# Patient Record
Sex: Female | Born: 1960 | Race: Black or African American | Hispanic: No | Smoking: Never smoker
Health system: Southern US, Community
[De-identification: ages and names within clinical notes are randomized; demographics above are authoritative.]

## PROBLEM LIST (undated history)

## (undated) DIAGNOSIS — R569 Unspecified convulsions: Secondary | ICD-10-CM

## (undated) DIAGNOSIS — D496 Neoplasm of unspecified behavior of brain: Secondary | ICD-10-CM

## (undated) DIAGNOSIS — J189 Pneumonia, unspecified organism: Secondary | ICD-10-CM

## (undated) DIAGNOSIS — E271 Primary adrenocortical insufficiency: Secondary | ICD-10-CM

## (undated) DIAGNOSIS — E232 Diabetes insipidus: Secondary | ICD-10-CM

## (undated) HISTORY — PX: TONSILLECTOMY: SUR1361

## (undated) HISTORY — PX: BRAIN SURGERY: SHX531

## (undated) HISTORY — PX: ABDOMINAL HYSTERECTOMY: SHX81

---

## 2005-06-27 ENCOUNTER — Emergency Department (HOSPITAL_COMMUNITY): Admission: EM | Admit: 2005-06-27 | Discharge: 2005-06-28 | Payer: Self-pay | Admitting: Emergency Medicine

## 2006-12-28 DIAGNOSIS — D352 Benign neoplasm of pituitary gland: Secondary | ICD-10-CM | POA: Diagnosis present

## 2010-05-04 HISTORY — PX: PEG TUBE PLACEMENT: SUR1034

## 2011-06-19 DIAGNOSIS — E271 Primary adrenocortical insufficiency: Secondary | ICD-10-CM | POA: Diagnosis present

## 2011-06-19 DIAGNOSIS — E039 Hypothyroidism, unspecified: Secondary | ICD-10-CM | POA: Diagnosis present

## 2012-01-17 DIAGNOSIS — R079 Chest pain, unspecified: Secondary | ICD-10-CM

## 2012-01-22 DIAGNOSIS — R031 Nonspecific low blood-pressure reading: Secondary | ICD-10-CM | POA: Insufficient documentation

## 2012-10-19 ENCOUNTER — Emergency Department (HOSPITAL_COMMUNITY): Payer: Medicare Other

## 2012-10-19 ENCOUNTER — Encounter (HOSPITAL_COMMUNITY): Payer: Self-pay | Admitting: *Deleted

## 2012-10-19 ENCOUNTER — Emergency Department (HOSPITAL_COMMUNITY)
Admission: EM | Admit: 2012-10-19 | Discharge: 2012-10-19 | Disposition: A | Discharge status: Home / Self Care | Payer: Medicare Other | Attending: Emergency Medicine | Admitting: Emergency Medicine

## 2012-10-19 DIAGNOSIS — Z8639 Personal history of other endocrine, nutritional and metabolic disease: Secondary | ICD-10-CM | POA: Insufficient documentation

## 2012-10-19 DIAGNOSIS — R498 Other voice and resonance disorders: Secondary | ICD-10-CM | POA: Insufficient documentation

## 2012-10-19 DIAGNOSIS — R509 Fever, unspecified: Secondary | ICD-10-CM | POA: Insufficient documentation

## 2012-10-19 DIAGNOSIS — R0602 Shortness of breath: Secondary | ICD-10-CM | POA: Insufficient documentation

## 2012-10-19 DIAGNOSIS — J029 Acute pharyngitis, unspecified: Secondary | ICD-10-CM | POA: Insufficient documentation

## 2012-10-19 DIAGNOSIS — Z862 Personal history of diseases of the blood and blood-forming organs and certain disorders involving the immune mechanism: Secondary | ICD-10-CM | POA: Insufficient documentation

## 2012-10-19 DIAGNOSIS — Z8701 Personal history of pneumonia (recurrent): Secondary | ICD-10-CM | POA: Insufficient documentation

## 2012-10-19 DIAGNOSIS — E119 Type 2 diabetes mellitus without complications: Secondary | ICD-10-CM | POA: Insufficient documentation

## 2012-10-19 HISTORY — DX: Pneumonia, unspecified organism: J18.9

## 2012-10-19 HISTORY — DX: Primary adrenocortical insufficiency: E27.1

## 2012-10-19 HISTORY — DX: Diabetes insipidus: E23.2

## 2012-10-19 LAB — CBC WITH DIFFERENTIAL/PLATELET
Basophils Absolute: 0 10*3/uL (ref 0.0–0.1)
Basophils Relative: 0 % (ref 0–1)
Eosinophils Absolute: 0 10*3/uL (ref 0.0–0.7)
Eosinophils Relative: 0 % (ref 0–5)
HCT: 38 % (ref 36.0–46.0)
Hemoglobin: 13.2 g/dL (ref 12.0–15.0)
Lymphocytes Relative: 34 % (ref 12–46)
Lymphs Abs: 2.2 10*3/uL (ref 0.7–4.0)
MCH: 27.8 pg (ref 26.0–34.0)
MCHC: 34.7 g/dL (ref 30.0–36.0)
MCV: 80 fL (ref 78.0–100.0)
Monocytes Absolute: 0.4 10*3/uL (ref 0.1–1.0)
Monocytes Relative: 6 % (ref 3–12)
Neutro Abs: 3.8 10*3/uL (ref 1.7–7.7)
Neutrophils Relative %: 60 % (ref 43–77)
Platelets: 274 10*3/uL (ref 150–400)
RBC: 4.75 MIL/uL (ref 3.87–5.11)
RDW: 12.4 % (ref 11.5–15.5)
WBC: 6.4 10*3/uL (ref 4.0–10.5)

## 2012-10-19 LAB — BASIC METABOLIC PANEL
BUN: 13 mg/dL (ref 6–23)
CO2: 26 mEq/L (ref 19–32)
Calcium: 10 mg/dL (ref 8.4–10.5)
Chloride: 102 mEq/L (ref 96–112)
Creatinine, Ser: 1 mg/dL (ref 0.50–1.10)
GFR calc Af Amer: 73 mL/min — ABNORMAL LOW (ref >90)
GFR calc non Af Amer: 63 mL/min — ABNORMAL LOW (ref >90)
Glucose, Bld: 107 mg/dL — ABNORMAL HIGH (ref 70–99)
Potassium: 3.6 mEq/L (ref 3.5–5.1)
Sodium: 139 mEq/L (ref 135–145)

## 2012-10-19 MED ORDER — DIPHENHYDRAMINE HCL 50 MG/ML IJ SOLN
50.0000 mg | Freq: Once | INTRAMUSCULAR | Status: AC
  Administered 2012-10-19: 50 mg via INTRAVENOUS
  Filled 2012-10-19: qty 1

## 2012-10-19 MED ORDER — SODIUM CHLORIDE 0.9 % IV BOLUS (SEPSIS)
1000.0000 mL | Freq: Once | INTRAVENOUS | Status: AC
  Administered 2012-10-19: 1000 mL via INTRAVENOUS

## 2012-10-19 MED ORDER — KETOROLAC TROMETHAMINE 15 MG/ML IJ SOLN
15.0000 mg | Freq: Once | INTRAMUSCULAR | Status: AC
  Administered 2012-10-19: 15 mg via INTRAVENOUS
  Filled 2012-10-19: qty 1

## 2012-10-19 MED ORDER — HYDROMORPHONE HCL PF 1 MG/ML IJ SOLN
1.0000 mg | Freq: Once | INTRAMUSCULAR | Status: AC
  Administered 2012-10-19: 1 mg via INTRAVENOUS
  Filled 2012-10-19: qty 1

## 2012-10-19 MED ORDER — OXYCODONE-ACETAMINOPHEN 5-325 MG PO TABS: 1.0000 | ORAL_TABLET | ORAL | Status: DC | PRN

## 2012-10-19 MED ORDER — HYDROCORTISONE SOD SUCCINATE 100 MG IJ SOLR
200.0000 mg | Freq: Once | INTRAMUSCULAR | Status: AC
  Administered 2012-10-19: 200 mg via INTRAVENOUS
  Filled 2012-10-19: qty 4

## 2012-10-19 MED ORDER — ONDANSETRON HCL 4 MG/2ML IJ SOLN
4.0000 mg | Freq: Once | INTRAMUSCULAR | Status: AC
  Administered 2012-10-19: 4 mg via INTRAVENOUS
  Filled 2012-10-19: qty 2

## 2012-10-19 MED ORDER — IOHEXOL 300 MG/ML  SOLN
100.0000 mL | Freq: Once | INTRAMUSCULAR | Status: AC | PRN
  Administered 2012-10-19: 100 mL via INTRAVENOUS

## 2012-10-19 NOTE — Progress Notes (Signed)
pcp dr Hetty Ely at [facility] updated

## 2012-10-19 NOTE — ED Notes (Signed)
Patient transported to X-ray 

## 2012-10-19 NOTE — ED Notes (Signed)
Pt escorted to discharge window. Verbalized understanding discharge instructions. In no acute distress.   

## 2012-10-19 NOTE — ED Notes (Signed)
Pt states developed shortness of breath and sore throat yesterday, pt states has worsened since then, states she feels like her throat is closing, pt very short of breath in talking.

## 2012-10-19 NOTE — ED Provider Notes (Signed)
History    52yF with sore throat and subjective fever. Onset yesterday and progressively worsening. Throat feels tight. Hoarse. No cp or sob. No wheezing. Has tired taking ibuprofen with only mild relief. Hx of addison's dz on chronic steroids.   CSN: [id number]  Arrival date & time 10/19/12  4098   First MD Initiated Contact with Patient 10/19/12 1010      Chief Complaint  Patient presents with  . Shortness of Breath  . Sore Throat    (Consider location/radiation/quality/duration/timing/severity/associated sxs/prior treatment) HPI  Past Medical History  Diagnosis Date  . Addison's disease   . Diabetes insipidus   . Pneumonia     Past Surgical History  Procedure Laterality Date  . Tonsillectomy    . Abdominal hysterectomy    . Brain surgery      crainotomy    No family history on file.  History  Substance Use Topics  . Smoking status: Never Smoker   . Smokeless tobacco: Never Used  . Alcohol Use: No    OB History   Grav Para Term Preterm Abortions TAB SAB Ect Mult Living                  Review of Systems  All systems reviewed and negative, other than as noted in HPI.   Allergies  Review of patient's allergies indicates not on file.  Home Medications  No current outpatient prescriptions on file.  BP 118/93  Pulse 95  Temp(Src) 99.2 F (37.3 C) (Oral)  Resp 17  SpO2 98%  Physical Exam  Nursing note and vitals reviewed. Constitutional: She is oriented to person, place, and time. She appears well-developed and well-nourished.  Laying in bed. Uncomfortable appearing, but not toxic.   HENT:  Head: Normocephalic and atraumatic.  Mouth/Throat: No oropharyngeal exudate.  Muffled phonation. Per pt, this is not her normal voice. Speech choppy. Doesn't seem SOB, but stopping after every few words because of apparent throat discomfort. Inferior aspect of visualized posterior pharynx erythematous. Tonsils absent. Handling secretion. No tongue elevation. No  stridor. No cervical adenopathy.   Eyes: Conjunctivae are normal. Pupils are equal, round, and reactive to light. Right eye exhibits no discharge. Left eye exhibits no discharge.  Neck: Neck supple. No tracheal deviation present. No thyromegaly present.  Cardiovascular: Normal rate, regular rhythm and normal heart sounds.  Exam reveals no gallop and no friction rub.   No murmur heard. Pulmonary/Chest: Effort normal and breath sounds normal. No stridor. No respiratory distress.  Abdominal: Soft. She exhibits no distension. There is no tenderness.  Musculoskeletal: She exhibits no edema and no tenderness.  Lymphadenopathy:    She has no cervical adenopathy.  Neurological: She is alert and oriented to person, place, and time.  Skin: Skin is warm and dry.  Psychiatric: She has a normal mood and affect. Her behavior is normal. Thought content normal.    ED Course  Procedures (including critical care time)  Labs Reviewed  BASIC METABOLIC PANEL - Abnormal; Notable for the following:    Glucose, Bld 107 (*)    GFR calc non Af Amer 63 (*)    GFR calc Af Amer 73 (*)    All other components within normal limits  CBC WITH DIFFERENTIAL   Dg Neck Soft Tissue  10/19/2012   *RADIOLOGY REPORT*  Clinical Data: Neck pain without injury  NECK SOFT TISSUES - 1+ VIEW  Comparison: None.  Findings: Frontal and lateral projections of the neck revealed the airway to be within  normal limits.  Degenerative changes of the cervical spine are noted from C4-C6.  No gross soft tissue abnormality is noted.  No prevertebral swelling is seen.  No tonsillar enlargement is noted.  IMPRESSION: No acute abnormalities seen.   Original Report Authenticated By: Alcide Clever, M.D.   Dg Chest 2 View  10/19/2012   *RADIOLOGY REPORT*  Clinical Data: Chest pain.  CHEST - 2 VIEW  Comparison: None.  Findings: Lungs are clear.  Heart size is normal.  No pneumothorax or pleural fluid.  Mild thoracolumbar scoliosis noted.  IMPRESSION: No  acute disease.   Original Report Authenticated By: Holley Dexter, M.D.   Ct Soft Tissue Neck W Contrast  10/19/2012   *RADIOLOGY REPORT*  Clinical Data: Sore throat with difficulty swallowing.  CT NECK WITH CONTRAST  Technique:  Multidetector CT imaging of the neck was performed with intravenous contrast.  Contrast: OMNIPAQUE IOHEXOL 300 MG/ML  SOLN  Comparison: Neck soft tissue radiographs earlier today.  Findings: Suprahyoid neck:  Normal. No parapharyngeal mass lesion or inflammatory process.  Oral cavity, base of tongue, and paranasal sinuses all grossly unremarkable.  No tonsillar peritonsillar abscess.  Larynx:  Normal.  Infrahyoid neck:  Normal.  Lymph nodes:  No pathologic adenopathy.  Upper chest/mediastinum:  Aberrant right subclavian artery passes behind the esophagus in the upper chest and can be associated with dysphagia. No mediastinal masses.  Trachea midline.  Lung apices clear.  Additional:  Moderate cervical spondylosis.  Disc space narrowing C4-5 and C5-6 with anterior spurring of a mild degree.  Visualized intracranial compartment unremarkable.  Extracranial vascular structures widely patent.  No neck masses.  IMPRESSION: No inflammatory process or mucosal lesion is seen in the neck.  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   Original Report Authenticated By: Davonna Belling, M.D.     1. Sore throat       MDM  10:53 AM 52yF with sore throat. Some concerning features. Pt's voice seems muffled, chronic steroid use, and she reports that she has to sit up to breath better although no tripoding/drooling or much distress noted on my exam. May just be laryngitis, but some concern for deep space neck infection. Will start with plain films but plan CT if nondiagnostic. Pt reports IV dye allergy although has had in several times w/ pretreatment after her initial event. Was planning on stress dose steroids  given hx of addison's anyways. 200mg  per radiology's pretreatment protocol. Basic labs. IVF. Pain control. Currently protecting airway. Will continue to closely monitor.    W/u unremarkable. Pt remains symptomatic, but improved. Repeat exam prior to discharge shows that now speaking in complete sentences but remains hoarse. No evidence of airway compromise. Has remained stable/improved over period of 5 hours of observation in ED. I feel safe for discharge. Return precautions discussed.       Raeford Razor, MD 10/19/12 [phone]

## 2013-01-07 DIAGNOSIS — R0602 Shortness of breath: Secondary | ICD-10-CM | POA: Insufficient documentation

## 2013-03-22 DIAGNOSIS — G40909 Epilepsy, unspecified, not intractable, without status epilepticus: Secondary | ICD-10-CM | POA: Insufficient documentation

## 2013-07-13 ENCOUNTER — Emergency Department (HOSPITAL_COMMUNITY)
Admission: EM | Admit: 2013-07-13 | Discharge: 2013-07-13 | Discharge status: Against Medical Advice | Payer: Medicare Other | Source: Home / Self Care

## 2013-07-13 ENCOUNTER — Encounter (HOSPITAL_COMMUNITY): Payer: Self-pay | Admitting: Emergency Medicine

## 2013-07-13 ENCOUNTER — Emergency Department (HOSPITAL_COMMUNITY): Payer: Medicare Other

## 2013-07-13 ENCOUNTER — Emergency Department (HOSPITAL_COMMUNITY)
Admission: EM | Admit: 2013-07-13 | Discharge: 2013-07-14 | Disposition: A | Discharge status: Home / Self Care | Payer: Medicare Other | Attending: Emergency Medicine | Admitting: Emergency Medicine

## 2013-07-13 DIAGNOSIS — R110 Nausea: Secondary | ICD-10-CM | POA: Insufficient documentation

## 2013-07-13 DIAGNOSIS — E232 Diabetes insipidus: Secondary | ICD-10-CM | POA: Insufficient documentation

## 2013-07-13 DIAGNOSIS — R079 Chest pain, unspecified: Secondary | ICD-10-CM

## 2013-07-13 DIAGNOSIS — G8929 Other chronic pain: Secondary | ICD-10-CM | POA: Insufficient documentation

## 2013-07-13 DIAGNOSIS — R0602 Shortness of breath: Secondary | ICD-10-CM | POA: Insufficient documentation

## 2013-07-13 DIAGNOSIS — E2749 Other adrenocortical insufficiency: Secondary | ICD-10-CM | POA: Insufficient documentation

## 2013-07-13 DIAGNOSIS — G40909 Epilepsy, unspecified, not intractable, without status epilepticus: Secondary | ICD-10-CM | POA: Insufficient documentation

## 2013-07-13 DIAGNOSIS — R072 Precordial pain: Secondary | ICD-10-CM | POA: Insufficient documentation

## 2013-07-13 DIAGNOSIS — Z79899 Other long term (current) drug therapy: Secondary | ICD-10-CM | POA: Insufficient documentation

## 2013-07-13 DIAGNOSIS — Z8701 Personal history of pneumonia (recurrent): Secondary | ICD-10-CM | POA: Insufficient documentation

## 2013-07-13 HISTORY — DX: Unspecified convulsions: R56.9

## 2013-07-13 LAB — TROPONIN I: Troponin I: <0.3 ng/mL (ref <0.30)

## 2013-07-13 LAB — CBC WITH DIFFERENTIAL/PLATELET
Basophils Absolute: 0 10*3/uL (ref 0.0–0.1)
Basophils Relative: 0 % (ref 0–1)
EOS ABS: 0 10*3/uL (ref 0.0–0.7)
EOS PCT: 0 % (ref 0–5)
HEMATOCRIT: 35.9 % — AB (ref 36.0–46.0)
HEMOGLOBIN: 11.9 g/dL — AB (ref 12.0–15.0)
LYMPHS ABS: 1.5 10*3/uL (ref 0.7–4.0)
LYMPHS PCT: 29 % (ref 12–46)
MCH: 26.4 pg (ref 26.0–34.0)
MCHC: 33.1 g/dL (ref 30.0–36.0)
MCV: 79.8 fL (ref 78.0–100.0)
MONO ABS: 0.2 10*3/uL (ref 0.1–1.0)
MONOS PCT: 3 % (ref 3–12)
Neutro Abs: 3.6 10*3/uL (ref 1.7–7.7)
Neutrophils Relative %: 68 % (ref 43–77)
PLATELETS: 262 10*3/uL (ref 150–400)
RBC: 4.5 MIL/uL (ref 3.87–5.11)
RDW: 12.2 % (ref 11.5–15.5)
WBC: 5.3 10*3/uL (ref 4.0–10.5)

## 2013-07-13 LAB — COMPREHENSIVE METABOLIC PANEL
ALBUMIN: 4.9 g/dL (ref 3.5–5.2)
ALK PHOS: 65 U/L (ref 39–117)
ALT: 10 U/L (ref 0–35)
AST: 35 U/L (ref 0–37)
BILIRUBIN TOTAL: 0.2 mg/dL — AB (ref 0.3–1.2)
BUN: 8 mg/dL (ref 6–23)
CALCIUM: 9.7 mg/dL (ref 8.4–10.5)
CO2: 21 meq/L (ref 19–32)
Chloride: 99 mEq/L (ref 96–112)
Creatinine, Ser: 1.11 mg/dL — ABNORMAL HIGH (ref 0.50–1.10)
GFR calc Af Amer: 65 mL/min — ABNORMAL LOW (ref >90)
GFR calc non Af Amer: 56 mL/min — ABNORMAL LOW (ref >90)
GLUCOSE: 124 mg/dL — AB (ref 70–99)
Potassium: 4 mEq/L (ref 3.7–5.3)
SODIUM: 139 meq/L (ref 137–147)
TOTAL PROTEIN: 8.1 g/dL (ref 6.0–8.3)

## 2013-07-13 LAB — CK: Total CK: 1131 U/L — ABNORMAL HIGH (ref 7–177)

## 2013-07-13 MED ORDER — LORAZEPAM 2 MG/ML IJ SOLN
0.5000 mg | Freq: Once | INTRAMUSCULAR | Status: AC
  Administered 2013-07-13: 0.5 mg via INTRAVENOUS
  Filled 2013-07-13: qty 1

## 2013-07-13 NOTE — ED Notes (Signed)
Pt states she has has been having CP and SOB for the past couple of days. Pt states that she was unable to sleep due to the pain and her SOB. Pt states she has been evaluated for the pain in several different hospitals and just moved here.

## 2013-07-13 NOTE — ED Notes (Signed)
Pt presents with chest pain and SOB that family reports has been going on all day. Pt actively vomiting and speaking in broken sentences. Pt presents in distress. Pt alert. Family with pt.

## 2013-07-13 NOTE — ED Notes (Signed)
Pt refusing blood work, pt informed about need for blood work with CP and heart enzymes, pt states she previous had a port and is a hard stick.

## 2013-07-14 LAB — URINALYSIS, ROUTINE W REFLEX MICROSCOPIC
Bilirubin Urine: NEGATIVE
GLUCOSE, UA: NEGATIVE mg/dL
Hgb urine dipstick: NEGATIVE
KETONES UR: NEGATIVE mg/dL
Nitrite: NEGATIVE
PH: 6.5 (ref 5.0–8.0)
Protein, ur: NEGATIVE mg/dL
SPECIFIC GRAVITY, URINE: 1.021 (ref 1.005–1.030)
Urobilinogen, UA: 0.2 mg/dL (ref 0.0–1.0)

## 2013-07-14 LAB — URINE MICROSCOPIC-ADD ON

## 2013-07-14 MED ORDER — ZOLPIDEM TARTRATE 5 MG PO TABS: 5.0000 mg | ORAL_TABLET | Freq: Every evening | ORAL | Status: DC | PRN

## 2013-07-14 MED ORDER — PROMETHAZINE HCL 25 MG PO TABS: 25.0000 mg | ORAL_TABLET | Freq: Four times a day (QID) | ORAL | Status: DC | PRN

## 2013-07-14 NOTE — Discharge Instructions (Signed)
TAKE PHENERGAN FOR NAUSEA. IT SHOULD ALSO HELP YOU WITH SLEEP AS IT IS A SEDATIVE. IT IS RECOMMENDED THAT YOU ESTABLISH WITH A LOCAL PHYSICIAN. RETURN HERE WITH ANY WORSENING SYMPTOMS OR NEW CONCERNS.    Chest Pain (Nonspecific) It is often hard to give a specific diagnosis for the cause of chest pain. There is always a chance that your pain could be related to something serious, such as a heart attack or a blood clot in the lungs. You need to follow up with your caregiver for further evaluation. CAUSES   Heartburn.  Pneumonia or bronchitis.  Anxiety or stress.  Inflammation around your heart (pericarditis) or lung (pleuritis or pleurisy).  A blood clot in the lung.  A collapsed lung (pneumothorax). It can develop suddenly on its own (spontaneous pneumothorax) or from injury (trauma) to the chest.  Shingles infection (herpes zoster virus). The chest wall is composed of bones, muscles, and cartilage. Any of these can be the source of the pain.  The bones can be bruised by injury.  The muscles or cartilage can be strained by coughing or overwork.  The cartilage can be affected by inflammation and become sore (costochondritis). DIAGNOSIS  Lab tests or other studies, such as X-rays, electrocardiography, stress testing, or cardiac imaging, may be needed to find the cause of your pain.  TREATMENT   Treatment depends on what may be causing your chest pain. Treatment may include:  Acid blockers for heartburn.  Anti-inflammatory medicine.  Pain medicine for inflammatory conditions.  Antibiotics if an infection is present.  You may be advised to change lifestyle habits. This includes stopping smoking and avoiding alcohol, caffeine, and chocolate.  You may be advised to keep your head raised (elevated) when sleeping. This reduces the chance of acid going backward from your stomach into your esophagus.  Most of the time, nonspecific chest pain will improve within 2 to 3 days with  rest and mild pain medicine. HOME CARE INSTRUCTIONS   If antibiotics were prescribed, take your antibiotics as directed. Finish them even if you start to feel better.  For the next few days, avoid physical activities that bring on chest pain. Continue physical activities as directed.  Do not smoke.  Avoid drinking alcohol.  Only take over-the-counter or prescription medicine for pain, discomfort, or fever as directed by your caregiver.  Follow your caregiver's suggestions for further testing if your chest pain does not go away.  Keep any follow-up appointments you made. If you do not go to an appointment, you could develop lasting (chronic) problems with pain. If there is any problem keeping an appointment, you must call to reschedule. SEEK MEDICAL CARE IF:   You think you are having problems from the medicine you are taking. Read your medicine instructions carefully.  Your chest pain does not go away, even after treatment.  You develop a rash with blisters on your chest. SEEK IMMEDIATE MEDICAL CARE IF:   You have increased chest pain or pain that spreads to your arm, neck, jaw, back, or abdomen.  You develop shortness of breath, an increasing cough, or you are coughing up blood.  You have severe back or abdominal pain, feel nauseous, or vomit.  You develop severe weakness, fainting, or chills.  You have a fever. THIS IS AN EMERGENCY. Do not wait to see if the pain will go away. Get medical help at once. Call your local emergency services (911 in [place] Do not drive yourself to the hospital. MAKE SURE YOU:  Understand these instructions.  Will watch your condition.  Will get help right away if you are not doing well or get worse. Document Released: 01/28/2005 Document Revised: 07/13/2011 Document Reviewed: 11/24/2007 [facility] Patient Information 2014 [place].

## 2013-07-14 NOTE — ED Provider Notes (Signed)
  Physical Exam  BP 130/79  Pulse 80  Temp(Src) 98.2 F (36.8 C) (Oral)  Resp 16  SpO2 97%  Physical Exam  ED Course  Procedures  MDM  Shared service with midlevel provider. I have personally seen and examined the patient, providing direct face to face care, presenting with the chief complaint of seizure and chest pain, dib. Physical exam findings include normal vitals, no resp distress and ao x 3 patient. Plan will be to d/c with some ambien, for sleep for the patient. I have reviewed the nursing documentation on past medical history, family history, and social history.  Pt has been advised to see her pcp for optimal pain control.   Varney Biles, MD 07/14/13 [phone]

## 2013-07-14 NOTE — ED Provider Notes (Signed)
CSN: [id number]     Arrival date & time 07/13/13  2057 History   First MD Initiated Contact with Patient 07/13/13 2234     Chief Complaint  Patient presents with  . Chest Pain  . Shortness of Breath     (Consider location/radiation/quality/duration/timing/severity/associated sxs/prior Treatment) Patient is a 53 y.o. female presenting with chest pain and shortness of breath. The history is provided by the patient and a relative. No language interpreter was used.  Chest Pain Pain location:  Substernal area Associated symptoms: nausea and shortness of breath   Associated symptoms: no fever   Associated symptoms comment:  She presents with complaint of chest pain, SOB, nausea and vomiting. She reports that it started last night but that it is recurrent from pain that started when a porta-cath was "crushed in her chest" in 2013. She states she is new to the area, having moved here from [place] because she is going through a divorce.  Shortness of Breath Associated symptoms: chest pain   Associated symptoms: no fever     Past Medical History  Diagnosis Date  . Addison's disease   . Diabetes insipidus   . Pneumonia   . Seizures    Past Surgical History  Procedure Laterality Date  . Tonsillectomy    . Abdominal hysterectomy    . Brain surgery      crainotomy   History reviewed. No pertinent family history. History  Substance Use Topics  . Smoking status: Never Smoker   . Smokeless tobacco: Never Used  . Alcohol Use: No   OB History   Grav Para Term Preterm Abortions TAB SAB Ect Mult Living                 Review of Systems  Constitutional: Negative for fever and chills.  Respiratory: Positive for shortness of breath.   Cardiovascular: Positive for chest pain.  Gastrointestinal: Positive for nausea.  Skin: Negative.   Neurological: Negative.       Allergies  Shellfish allergy and Contrast media  Home Medications   Current Outpatient Rx  Name  Route  Sig   Dispense  Refill  . ALPRAZolam (XANAX) 1 MG tablet   Oral   Take 1 mg by mouth 3 (three) times daily as needed for sleep.         Marland Kitchen azelastine (OPTIVAR) 0.05 % ophthalmic solution   Both Eyes   Place 1 drop into both eyes 2 (two) times daily.         . calcium-vitamin D (OSCAL WITH D) 500-200 MG-UNIT per tablet   Oral   Take 1 tablet by mouth daily.         . fentaNYL (DURAGESIC - DOSED MCG/HR) 75 MCG/HR   Transdermal   Place 1 patch onto the skin every 3 (three) days.         Marland Kitchen gabapentin (NEURONTIN) 300 MG capsule   Oral   Take 300 mg by mouth 3 (three) times daily.         Marland Kitchen HYDROcodone-acetaminophen (NORCO) 10-325 MG per tablet   Oral   Take 1 tablet by mouth every 6 (six) hours as needed for moderate pain.          . hydrocortisone (CORTEF) 20 MG tablet   Oral   Take 10-20 mg by mouth daily. 20 mg in the morning. 10 mg in the afternoon         . levETIRAcetam (KEPPRA) 1000 MG tablet   Oral  Take 1,000 mg by mouth 2 (two) times daily.         Marland Kitchen levothyroxine (SYNTHROID, LEVOTHROID) 100 MCG tablet   Oral   Take 100 mcg by mouth daily before breakfast.         . mirtazapine (REMERON) 15 MG tablet   Oral   Take 15 mg by mouth at bedtime.         . ondansetron (ZOFRAN) 4 MG tablet   Oral   Take 4 mg by mouth every 6 (six) hours as needed for nausea or vomiting.         . potassium chloride (MICRO-K) 10 MEQ CR capsule   Oral   Take 10 mEq by mouth daily.          Marland Kitchen tiZANidine (ZANAFLEX) 4 MG tablet   Oral   Take 4 mg by mouth every 6 (six) hours as needed (pain).         . butalbital-acetaminophen-caffeine (FIORICET WITH CODEINE) 50-325-40-30 MG per capsule   Oral   Take 2 capsules by mouth every 4 (four) hours as needed for headache.          BP 140/80  Pulse 55  Resp 16  SpO2 100% Physical Exam  Constitutional: She is oriented to person, place, and time. She appears well-developed and well-nourished. No distress.  On  initial evaluation for HPI and PE, the patient is being held on her side by daughter who reports she is having a seizure. There is no gross motor movement. The patient's eyelids are closed and resist opening for pupillary exam. She is breathing regularly. Her hands and feet are loose and mobile and her legs and arms resist movement but are not rigid. There is no vomiting, tongue biting, urinary incontinence and she awakens shortly after event and is able to answer questions.   HENT:  Head: Normocephalic and atraumatic.  Mouth/Throat: Oropharynx is clear and moist.  Eyes: Conjunctivae and EOM are normal.  Pupils are dilated to 7-8 mm and sluggish bilaterally.  Neck: Normal range of motion. Neck supple.  Cardiovascular: Normal rate and regular rhythm.   Pulmonary/Chest: Effort normal and breath sounds normal.  Abdominal: Soft. Bowel sounds are normal. There is no tenderness. There is no rebound and no guarding.  Musculoskeletal: Normal range of motion. She exhibits no edema.  Neurological: She is alert and oriented to person, place, and time.  Skin: Skin is warm and dry. No rash noted.    ED Course  Procedures (including critical care time) Labs Review Labs Reviewed  CBC WITH DIFFERENTIAL - Abnormal; Notable for the following:    Hemoglobin 11.9 (*)    HCT 35.9 (*)    All other components within normal limits  COMPREHENSIVE METABOLIC PANEL - Abnormal; Notable for the following:    Glucose, Bld 124 (*)    Creatinine, Ser 1.11 (*)    Total Bilirubin 0.2 (*)    GFR calc non Af Amer 56 (*)    GFR calc Af Amer 65 (*)    All other components within normal limits  CK - Abnormal; Notable for the following:    Total CK 1131 (*)    All other components within normal limits  TROPONIN I  URINALYSIS, ROUTINE W REFLEX MICROSCOPIC   Imaging Review Dg Chest Portable 1 View  07/14/2013   CLINICAL DATA:  Chest pain, shortness of breath.  EXAM: PORTABLE CHEST - 1 VIEW  COMPARISON:  October 19, 2012.   FINDINGS: The heart size  and mediastinal contours are within normal limits. Both lungs are clear. No pneumothorax or pleural effusion is noted. The visualized skeletal structures are unremarkable.  IMPRESSION: No acute cardiopulmonary abnormality seen.   Electronically Signed   By: Sabino Dick M.D.   On: 07/14/2013 00:08     EKG Interpretation   Date/Time:  Thursday July 13 2013 21:00:24 EDT Ventricular Rate:  86 PR Interval:  123 QRS Duration: 93 QT Interval:  404 QTC Calculation: 483 R Axis:   45 Text Interpretation:  Sinus rhythm Borderline repolarization abnormality  Nonspecific T wave abnormality present in prior EKG's No significant  change since last tracing earlier today and 19 Oct 2012 Confirmed by KNAPP   MD-I, IVA ([id number]) on 07/13/2013 9:06:37 PM      MDM   Final diagnoses:  None    1. Chest pain 2. Seizure disorder.   On initial evaluation for HPI and PE, the patient is being held on her side by daughter who reports she is having a seizure. There is no gross motor movement. The patient's eyelids are closed and resist opening for pupillary exam. She is breathing regularly. Her hands and feet are loose and mobile and her legs and arms resist movement but are not rigid. There is no vomiting, tongue biting, urinary incontinence and she awakens shortly after event and is able to answer questions. She speaks in a whisper until requested multiple times to use her voice, at which time she is able to speak.  Records reviewed, including accessible [facility] from [facility] and [facility]. She has been seen for chest pain multiple times, seems to perseverate on a chest injury after porta-cath removed. She had an echo at [facility] that was negative. Multiple admissions referred to for nausea and vomiting, ?gastroparesis, but gastric emptying study was normal. 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 What was witnessed as possible seizure in ED did not follow typical seizure presentation but her total CK elevated. She was given 0.5 mg Ativan without further seizure like activity. Her lab studies for evaluation of chest pain here are normal, chest x-ray is normal, EKG non-acute - doubt ACS. Feel she is stable for discharge. Discussed getting a doctor locally for maintenance of pain, insomnia, recurrent/chronic chest pain.   Dewaine Oats, PA-C 07/14/13 0131

## 2013-12-28 DIAGNOSIS — E232 Diabetes insipidus: Secondary | ICD-10-CM | POA: Diagnosis present

## 2014-02-21 ENCOUNTER — Emergency Department (HOSPITAL_COMMUNITY)
Admission: EM | Admit: 2014-02-21 | Discharge: 2014-02-21 | Disposition: A | Discharge status: Home / Self Care | Payer: Medicare Other | Attending: Emergency Medicine | Admitting: Emergency Medicine

## 2014-02-21 ENCOUNTER — Encounter (HOSPITAL_COMMUNITY): Payer: Self-pay | Admitting: Emergency Medicine

## 2014-02-21 ENCOUNTER — Emergency Department (HOSPITAL_COMMUNITY): Payer: Medicare Other

## 2014-02-21 DIAGNOSIS — G40909 Epilepsy, unspecified, not intractable, without status epilepticus: Secondary | ICD-10-CM | POA: Insufficient documentation

## 2014-02-21 DIAGNOSIS — S7011XA Contusion of right thigh, initial encounter: Secondary | ICD-10-CM

## 2014-02-21 DIAGNOSIS — Z8701 Personal history of pneumonia (recurrent): Secondary | ICD-10-CM | POA: Insufficient documentation

## 2014-02-21 DIAGNOSIS — F418 Other specified anxiety disorders: Secondary | ICD-10-CM

## 2014-02-21 DIAGNOSIS — Z7952 Long term (current) use of systemic steroids: Secondary | ICD-10-CM | POA: Insufficient documentation

## 2014-02-21 DIAGNOSIS — R079 Chest pain, unspecified: Secondary | ICD-10-CM | POA: Diagnosis not present

## 2014-02-21 DIAGNOSIS — Z3202 Encounter for pregnancy test, result negative: Secondary | ICD-10-CM | POA: Insufficient documentation

## 2014-02-21 DIAGNOSIS — Z880 Allergy status to penicillin: Secondary | ICD-10-CM | POA: Insufficient documentation

## 2014-02-21 DIAGNOSIS — M7981 Nontraumatic hematoma of soft tissue: Secondary | ICD-10-CM | POA: Insufficient documentation

## 2014-02-21 DIAGNOSIS — F419 Anxiety disorder, unspecified: Secondary | ICD-10-CM | POA: Diagnosis not present

## 2014-02-21 DIAGNOSIS — Z8639 Personal history of other endocrine, nutritional and metabolic disease: Secondary | ICD-10-CM | POA: Insufficient documentation

## 2014-02-21 DIAGNOSIS — Z79899 Other long term (current) drug therapy: Secondary | ICD-10-CM | POA: Diagnosis not present

## 2014-02-21 DIAGNOSIS — R064 Hyperventilation: Secondary | ICD-10-CM | POA: Diagnosis present

## 2014-02-21 LAB — COMPREHENSIVE METABOLIC PANEL
ALBUMIN: 4.6 g/dL (ref 3.5–5.2)
ALK PHOS: 82 U/L (ref 39–117)
ALT: 10 U/L (ref 0–35)
ANION GAP: 18 — AB (ref 5–15)
AST: 51 U/L — ABNORMAL HIGH (ref 0–37)
BUN: 9 mg/dL (ref 6–23)
CO2: 23 mEq/L (ref 19–32)
Calcium: 9.3 mg/dL (ref 8.4–10.5)
Chloride: 104 mEq/L (ref 96–112)
Creatinine, Ser: 1.43 mg/dL — ABNORMAL HIGH (ref 0.50–1.10)
GFR calc Af Amer: 47 mL/min — ABNORMAL LOW (ref >90)
GFR calc non Af Amer: 41 mL/min — ABNORMAL LOW (ref >90)
Glucose, Bld: 119 mg/dL — ABNORMAL HIGH (ref 70–99)
POTASSIUM: 3.8 meq/L (ref 3.7–5.3)
Sodium: 145 mEq/L (ref 137–147)
Total Bilirubin: 0.4 mg/dL (ref 0.3–1.2)
Total Protein: 7.7 g/dL (ref 6.0–8.3)

## 2014-02-21 LAB — URINALYSIS, ROUTINE W REFLEX MICROSCOPIC
Bilirubin Urine: NEGATIVE
GLUCOSE, UA: NEGATIVE mg/dL
Ketones, ur: NEGATIVE mg/dL
Nitrite: NEGATIVE
PH: 5.5 (ref 5.0–8.0)
PROTEIN: NEGATIVE mg/dL
Specific Gravity, Urine: 1.018 (ref 1.005–1.030)
Urobilinogen, UA: 0.2 mg/dL (ref 0.0–1.0)

## 2014-02-21 LAB — PRO B NATRIURETIC PEPTIDE: PRO B NATRI PEPTIDE: 566.6 pg/mL — AB (ref 0–125)

## 2014-02-21 LAB — CBC WITH DIFFERENTIAL/PLATELET
Basophils Absolute: 0 10*3/uL (ref 0.0–0.1)
Basophils Relative: 0 % (ref 0–1)
EOS ABS: 0 10*3/uL (ref 0.0–0.7)
Eosinophils Relative: 0 % (ref 0–5)
HCT: 35.1 % — ABNORMAL LOW (ref 36.0–46.0)
Hemoglobin: 11.8 g/dL — ABNORMAL LOW (ref 12.0–15.0)
LYMPHS ABS: 1.8 10*3/uL (ref 0.7–4.0)
Lymphocytes Relative: 23 % (ref 12–46)
MCH: 26.9 pg (ref 26.0–34.0)
MCHC: 33.6 g/dL (ref 30.0–36.0)
MCV: 80.1 fL (ref 78.0–100.0)
MONOS PCT: 6 % (ref 3–12)
Monocytes Absolute: 0.5 10*3/uL (ref 0.1–1.0)
NEUTROS ABS: 5.4 10*3/uL (ref 1.7–7.7)
NEUTROS PCT: 71 % (ref 43–77)
PLATELETS: 284 10*3/uL (ref 150–400)
RBC: 4.38 MIL/uL (ref 3.87–5.11)
RDW: 12.8 % (ref 11.5–15.5)
WBC: 7.6 10*3/uL (ref 4.0–10.5)

## 2014-02-21 LAB — PROTIME-INR
INR: 1.07 (ref 0.00–1.49)
Prothrombin Time: 14 seconds (ref 11.6–15.2)

## 2014-02-21 LAB — URINE MICROSCOPIC-ADD ON

## 2014-02-21 LAB — I-STAT TROPONIN, ED: TROPONIN I, POC: 0.01 ng/mL (ref 0.00–0.08)

## 2014-02-21 LAB — MAGNESIUM: Magnesium: 2.1 mg/dL (ref 1.5–2.5)

## 2014-02-21 LAB — POC URINE PREG, ED: Preg Test, Ur: NEGATIVE

## 2014-02-21 LAB — LIPASE, BLOOD: Lipase: 62 U/L — ABNORMAL HIGH (ref 11–59)

## 2014-02-21 LAB — APTT: aPTT: 30 seconds (ref 24–37)

## 2014-02-21 LAB — PHOSPHORUS: Phosphorus: 2.2 mg/dL — ABNORMAL LOW (ref 2.3–4.6)

## 2014-02-21 MED ORDER — LORAZEPAM 2 MG/ML IJ SOLN
1.0000 mg | Freq: Once | INTRAMUSCULAR | Status: AC
  Administered 2014-02-21: 1 mg via INTRAVENOUS
  Filled 2014-02-21: qty 1

## 2014-02-21 MED ORDER — SODIUM CHLORIDE 0.9 % IV SOLN
INTRAVENOUS | Status: DC
  Administered 2014-02-21: 13:00:00 via INTRAVENOUS

## 2014-02-21 NOTE — Discharge Instructions (Signed)
Probable Contusion A contusion is a deep bruise. Contusions are the result of an injury that caused bleeding under the skin. The contusion may turn blue, purple, or yellow. Minor injuries will give you a painless contusion, but more severe contusions may stay painful and swollen for a few weeks.                                                                                                                                                        Possible Insect Bite Mosquitoes, flies, fleas, bedbugs, and many other insects can bite. Insect bites are different from insect stings. A sting is when venom is injected into the skin. Some insect bites can transmit infectious diseases. SYMPTOMS  Insect bites usually turn red, swell, and itch for 2 to 4 days. They often go away on their own. TREATMENT  Your caregiver may prescribe antibiotic medicines if a bacterial infection develops in the bite. HOME CARE INSTRUCTIONS Do not scratch the bite area. Keep the bite area clean and dry. Wash the bite area thoroughly with soap and water. Put ice or cool compresses on the bite area. Put ice in a plastic bag. Place a towel between your skin and the bag. Leave the ice on for 20 minutes, 4 times a day for the first 2 to 3 days, or as directed. You may apply a baking soda paste, cortisone cream, or calamine lotion to the bite area as directed by your caregiver. This can help reduce itching and swelling. Only take over-the-counter or prescription medicines as directed by your caregiver. If you are given antibiotics, take them as directed. Finish them even if you start to feel better. You may need a tetanus shot if: You cannot remember when you had your last tetanus shot. You have never had a tetanus shot. The injury broke your skin. If you get a tetanus shot, your arm may swell, get red, and feel warm to the touch. This is common and not a problem. If you need a tetanus shot and you choose not to have one, there is  a rare chance of getting tetanus. Sickness from tetanus can be serious. SEEK IMMEDIATE MEDICAL CARE IF:  You have increased pain, redness, or swelling in the bite area. You see a red line on the skin coming from the bite. You have a fever. You have joint pain. You have a headache or neck pain. You have unusual weakness. You have a rash. You have chest pain or shortness of breath. You have abdominal pain, nausea, or vomiting. You feel unusually tired or sleepy. MAKE SURE YOU:  Understand these instructions. Will watch your condition. Will get help right away if you are not doing well or get worse. Document Released: 05/28/2004 Document Revised: 07/13/2011 Document Reviewed: 11/19/2010 [facility] Patient Information 2015 [place], [place]. This information  is not intended to replace advice given to you by your health care provider. Make sure you discuss any questions you have with your health care provider.    Bruising of the injured area.  Tenderness and soreness of the injured area.  Pain. DIAGNOSIS  The diagnosis can be made by taking a history and physical exam. An X-ray, CT scan, or MRI may be needed to determine if there were any associated injuries, such as fractures. TREATMENT  Specific treatment will depend on what area of the body was injured. In general, the best treatment for a contusion is resting, icing, elevating, and applying cold compresses to the injured area. Over-the-counter medicines may also be recommended for pain control. Ask your caregiver what the best treatment is for your contusion. HOME CARE INSTRUCTIONS   Put ice on the injured area.  Put ice in a plastic bag.  Place a towel between your skin and the bag.  Leave the ice on for 15-20 minutes, 3-4 times a day, or as directed by your health care provider.  Only take over-the-counter or prescription medicines for pain, discomfort, or fever as directed by your caregiver. Your caregiver may recommend avoiding  anti-inflammatory medicines (aspirin, ibuprofen, and naproxen) for 48 hours because these medicines may increase bruising.  Rest the injured area.  If possible, elevate the injured area to reduce swelling. SEEK IMMEDIATE MEDICAL CARE IF:   You have increased bruising or swelling.  You have pain that is getting worse.  Your swelling or pain is not relieved with medicines. MAKE SURE YOU:   Understand these instructions.  Will watch your condition.  Will get help right away if you are not doing well or get worse. Document Released: 01/28/2005 Document Revised: 04/25/2013 Document Reviewed: 02/23/2011 [name] Patient Information 2015 [place], [place]. This information is not intended to replace advice given to you by your health care provider. Make sure you discuss any questions you have with your health care provider.

## 2014-02-21 NOTE — ED Notes (Addendum)
This nurse was called to the registration desk because patient was hyperventilating.  States she found a bug in her bed that was long and skinny with lots of legs.  Pt states that she was bit on rt thigh and it has since spread down leg and states that she is having trouble breathing ( 98% on RA).  Pt appears very anxious, talking very fast.  States that she feels like she has PNA because when she coughs, it "rattles".  "Whatever this is, it's in my blood system now".  Pt also states that it hurts when she pees.  When asked when this started, pt seemed confused.  States that all of her symptoms happened abruptly this morning when she was bit by bug.

## 2014-02-21 NOTE — ED Provider Notes (Signed)
CSN: [id number]     Arrival date & time 02/21/14  1050 History   First MD Initiated Contact with Patient 02/21/14 1126     Chief Complaint  Patient presents with  . Insect Bite  . Hyperventilating     (Consider location/radiation/quality/duration/timing/severity/associated sxs/prior Treatment) HPI The patient gives a rather disjointed history. She reports that she was going to call her son to bring her to the emergency department for chest pain under any circumstances because of some symptoms she had one in 2 days ago. She reports she gets pressure in her chest and feels short of breath and notes her heart beating fast. However she reports that a day or 2 ago she killed an insect that seemed to have a long body in a lot of legs that was in her room. She reports that she didn't immediately find it so she was worried that maybe it was in her bed. She awakened this morning not feeling well and then noticed a red area on her anterior thigh which she thinks is due to an insect bite. She reports at that point she could feel pain and probably infection coursing through the entirety of her leg. This then made her chest discomfort her breathing harder as well. The patient reports that she gets most of her care at [facility] in [place] due to the complexity of her underlying medical problems.  Past Medical History  Diagnosis Date  . Addison's disease   . Diabetes insipidus   . Pneumonia   . Seizures    Past Surgical History  Procedure Laterality Date  . Tonsillectomy    . Abdominal hysterectomy    . Brain surgery      crainotomy   No family history on file. History  Substance Use Topics  . Smoking status: Never Smoker   . Smokeless tobacco: Never Used  . Alcohol Use: No   OB History   Grav Para Term Preterm Abortions TAB SAB Ect Mult Living                 Review of Systems 10 Systems reviewed and are negative for acute change except as noted in the HPI.    Allergies  Penicillins;  Shellfish allergy; and Contrast media  Home Medications   Prior to Admission medications   Medication Sig Start Date End Date Taking? Authorizing Provider  ALPRAZolam Duanne Moron) 1 MG tablet Take 1 mg by mouth 3 (three) times daily as needed for sleep (slep).    Yes Historical Provider, MD  fentaNYL (DURAGESIC - DOSED MCG/HR) 75 MCG/HR Place 1 patch onto the skin every 3 (three) days.   Yes Historical Provider, MD  gabapentin (NEURONTIN) 300 MG capsule Take 300 mg by mouth 3 (three) times daily.   Yes Historical Provider, MD  hydrocortisone (CORTEF) 20 MG tablet Take 10-20 mg by mouth daily. 20 mg in the morning. 10 mg in the afternoon   Yes Historical Provider, MD  levETIRAcetam (KEPPRA) 1000 MG tablet Take 1,000 mg by mouth 2 (two) times daily.   Yes Historical Provider, MD  levothyroxine (SYNTHROID, LEVOTHROID) 100 MCG tablet Take 100 mcg by mouth daily before breakfast.   Yes Historical Provider, MD  mirtazapine (REMERON) 15 MG tablet Take 15 mg by mouth at bedtime.   Yes Historical Provider, MD  potassium chloride (MICRO-K) 10 MEQ CR capsule Take 10 mEq by mouth daily.  07/13/13  Yes Historical Provider, MD  promethazine (PHENERGAN) 25 MG tablet Take 25 mg by mouth every 6 (  six) hours as needed for nausea or vomiting (nausea & vomiting).   Yes Historical Provider, MD  tiZANidine (ZANAFLEX) 4 MG tablet Take 4 mg by mouth every 6 (six) hours as needed (pain).   Yes Historical Provider, MD   BP 135/108  Pulse 97  Temp(Src) 98.6 F (37 C) (Oral)  Resp 19  SpO2 98% Physical Exam  Constitutional: She is oriented to person, place, and time. She appears well-developed and well-nourished.  The patient has a generally well appearance, although she does appear quite anxious and is tremulous with a quaver in her voice. As she is recounting her history to me she is opening and closing her fingertips together in a nervous tic-like fashion  HENT:  Head: Normocephalic and atraumatic.  Nose: Nose normal.   Mouth/Throat: Oropharynx is clear and moist. No oropharyngeal exudate.  Eyes: EOM are normal. Pupils are equal, round, and reactive to light.  Neck: Neck supple.  Cardiovascular: Normal rate, regular rhythm, normal heart sounds and intact distal pulses.   Pulmonary/Chest: Effort normal and breath sounds normal.  Abdominal: Soft. Bowel sounds are normal. She exhibits no distension. There is no tenderness.  Musculoskeletal: Normal range of motion. She exhibits no edema.  Consultations right upper thigh just slightly laterally, there is approximately a 1 cm flat area that looks like a bruise. This is not elevated there is no surrounding erythema. The general appearance is not suggestive of an insect envenomation. The remainder of her leg is normal in appearance there is no peripheral edema and no calf tenderness. Skin condition is otherwise excellent.  Neurological: She is alert and oriented to person, place, and time. She has normal strength. Coordination normal. GCS eye subscore is 4. GCS verbal subscore is 5. GCS motor subscore is 6.  Skin: Skin is warm, dry and intact.  Psychiatric: She has a normal mood and affect.    ED Course  Procedures (including critical care time) Labs Review Labs Reviewed  COMPREHENSIVE METABOLIC PANEL - Abnormal; Notable for the following:    Glucose, Bld 119 (*)    Creatinine, Ser 1.43 (*)    AST 51 (*)    GFR calc non Af Amer 41 (*)    GFR calc Af Amer 47 (*)    Anion gap 18 (*)    All other components within normal limits  LIPASE, BLOOD - Abnormal; Notable for the following:    Lipase 62 (*)    All other components within normal limits  CBC WITH DIFFERENTIAL - Abnormal; Notable for the following:    Hemoglobin 11.8 (*)    HCT 35.1 (*)    All other components within normal limits  PRO B NATRIURETIC PEPTIDE - Abnormal; Notable for the following:    Pro B Natriuretic peptide (BNP) 566.6 (*)    All other components within normal limits  PHOSPHORUS -  Abnormal; Notable for the following:    Phosphorus 2.2 (*)    All other components within normal limits  URINALYSIS, ROUTINE W REFLEX MICROSCOPIC - Abnormal; Notable for the following:    APPearance CLOUDY (*)    Hgb urine dipstick SMALL (*)    Leukocytes, UA MODERATE (*)    All other components within normal limits  URINE CULTURE  APTT  PROTIME-INR  MAGNESIUM  URINE MICROSCOPIC-ADD ON  Randolm Idol, ED  POC URINE PREG, ED    Imaging Review Dg Chest 2 View  02/21/2014   CLINICAL DATA:  Insect bite on right thigh.  Hyperventilating.  EXAM: CHEST  2 VIEW  COMPARISON:  07/13/2013  FINDINGS: The heart size and mediastinal contours are within normal limits. Both lungs are clear. The visualized skeletal structures are unremarkable.  IMPRESSION: No active cardiopulmonary disease.   Electronically Signed   By: Franchot Gallo M.D.   On: 02/21/2014 11:52     EKG Interpretation None      MDM   Final diagnoses:  Contusion of anterior thigh, right, initial encounter  Anxiety about health  Chest pain, unspecified chest pain type   At this time is questionable whether or not the area of the patient's leg represents an insect bite. Clinically it does not have the appearance. Her diagnostic workup is within normal limits. Clinically she has a well general appearance. The patient is exhibiting significant amount of anxiety with tremulousness and pressured speech. Review of medical records does indicate to the patient suffers frequently from recurrent chest pain. At this time I don't find any suggestion of acute coronary syndrome PE or other imminently dangerous medical condition. I do feel patient is safe for discharge and continued evaluation with her family physician and primary providers. The patient has known Addison's disease but does not appear to be acutely in a crisis or have an acute infectious illness.    Charlesetta Shanks, MD 02/21/14 [phone]

## 2014-02-21 NOTE — ED Notes (Signed)
Patient transported to X-ray 

## 2014-02-21 NOTE — ED Notes (Signed)
Pt states "I am not happy with my discharge because my leg still hurt and I did get bit by something, I would like in my chart that I am not having any anxiety". Pt made aware that there is not enter or exit or puncture wounds to her leg and mark on her leg is similar to bruising, understand that her leg is still hurting and that is why the MD recommends that she followup with her PCP in 1 week.

## 2014-02-22 LAB — URINE CULTURE
COLONY COUNT: NO GROWTH
CULTURE: NO GROWTH

## 2014-02-23 ENCOUNTER — Emergency Department (HOSPITAL_BASED_OUTPATIENT_CLINIC_OR_DEPARTMENT_OTHER): Payer: Medicare Other

## 2014-02-23 ENCOUNTER — Encounter (HOSPITAL_BASED_OUTPATIENT_CLINIC_OR_DEPARTMENT_OTHER): Payer: Self-pay | Admitting: Emergency Medicine

## 2014-02-23 ENCOUNTER — Emergency Department (HOSPITAL_BASED_OUTPATIENT_CLINIC_OR_DEPARTMENT_OTHER)
Admission: EM | Admit: 2014-02-23 | Discharge: 2014-02-23 | Disposition: A | Discharge status: Home / Self Care | Payer: Medicare Other | Source: Home / Self Care | Attending: Emergency Medicine | Admitting: Emergency Medicine

## 2014-02-23 DIAGNOSIS — S7011XD Contusion of right thigh, subsequent encounter: Secondary | ICD-10-CM | POA: Insufficient documentation

## 2014-02-23 DIAGNOSIS — Z880 Allergy status to penicillin: Secondary | ICD-10-CM

## 2014-02-23 DIAGNOSIS — Z7952 Long term (current) use of systemic steroids: Secondary | ICD-10-CM | POA: Insufficient documentation

## 2014-02-23 DIAGNOSIS — H02402 Unspecified ptosis of left eyelid: Secondary | ICD-10-CM | POA: Insufficient documentation

## 2014-02-23 DIAGNOSIS — Z8639 Personal history of other endocrine, nutritional and metabolic disease: Secondary | ICD-10-CM | POA: Insufficient documentation

## 2014-02-23 DIAGNOSIS — R569 Unspecified convulsions: Secondary | ICD-10-CM | POA: Diagnosis not present

## 2014-02-23 DIAGNOSIS — R05 Cough: Secondary | ICD-10-CM

## 2014-02-23 DIAGNOSIS — Z79899 Other long term (current) drug therapy: Secondary | ICD-10-CM

## 2014-02-23 DIAGNOSIS — G40909 Epilepsy, unspecified, not intractable, without status epilepticus: Secondary | ICD-10-CM | POA: Insufficient documentation

## 2014-02-23 DIAGNOSIS — Z8701 Personal history of pneumonia (recurrent): Secondary | ICD-10-CM

## 2014-02-23 DIAGNOSIS — G40401 Other generalized epilepsy and epileptic syndromes, not intractable, with status epilepticus: Secondary | ICD-10-CM | POA: Diagnosis not present

## 2014-02-23 DIAGNOSIS — W57XXXD Bitten or stung by nonvenomous insect and other nonvenomous arthropods, subsequent encounter: Secondary | ICD-10-CM | POA: Insufficient documentation

## 2014-02-23 DIAGNOSIS — R531 Weakness: Secondary | ICD-10-CM | POA: Insufficient documentation

## 2014-02-23 DIAGNOSIS — E876 Hypokalemia: Secondary | ICD-10-CM

## 2014-02-23 DIAGNOSIS — R059 Cough, unspecified: Secondary | ICD-10-CM

## 2014-02-23 LAB — URINALYSIS, ROUTINE W REFLEX MICROSCOPIC
BILIRUBIN URINE: NEGATIVE
Glucose, UA: NEGATIVE mg/dL
Hgb urine dipstick: NEGATIVE
KETONES UR: 15 mg/dL — AB
Nitrite: NEGATIVE
PH: 8 (ref 5.0–8.0)
PROTEIN: NEGATIVE mg/dL
Specific Gravity, Urine: 1.012 (ref 1.005–1.030)
UROBILINOGEN UA: 1 mg/dL (ref 0.0–1.0)

## 2014-02-23 LAB — BASIC METABOLIC PANEL
ANION GAP: 16 — AB (ref 5–15)
BUN: 8 mg/dL (ref 6–23)
CO2: 25 meq/L (ref 19–32)
Calcium: 9.5 mg/dL (ref 8.4–10.5)
Chloride: 102 mEq/L (ref 96–112)
Creatinine, Ser: 1.2 mg/dL — ABNORMAL HIGH (ref 0.50–1.10)
GFR, EST AFRICAN AMERICAN: 58 mL/min — AB (ref >90)
GFR, EST NON AFRICAN AMERICAN: 50 mL/min — AB (ref >90)
Glucose, Bld: 96 mg/dL (ref 70–99)
Potassium: 2.9 mEq/L — CL (ref 3.7–5.3)
SODIUM: 143 meq/L (ref 137–147)

## 2014-02-23 LAB — CBC WITH DIFFERENTIAL/PLATELET
Basophils Absolute: 0 10*3/uL (ref 0.0–0.1)
Basophils Relative: 0 % (ref 0–1)
Eosinophils Absolute: 0 10*3/uL (ref 0.0–0.7)
Eosinophils Relative: 0 % (ref 0–5)
HCT: 35 % — ABNORMAL LOW (ref 36.0–46.0)
Hemoglobin: 11.6 g/dL — ABNORMAL LOW (ref 12.0–15.0)
LYMPHS PCT: 40 % (ref 12–46)
Lymphs Abs: 2.4 10*3/uL (ref 0.7–4.0)
MCH: 26.5 pg (ref 26.0–34.0)
MCHC: 33.1 g/dL (ref 30.0–36.0)
MCV: 79.9 fL (ref 78.0–100.0)
Monocytes Absolute: 0.5 10*3/uL (ref 0.1–1.0)
Monocytes Relative: 8 % (ref 3–12)
Neutro Abs: 3 10*3/uL (ref 1.7–7.7)
Neutrophils Relative %: 52 % (ref 43–77)
PLATELETS: 246 10*3/uL (ref 150–400)
RBC: 4.38 MIL/uL (ref 3.87–5.11)
RDW: 12.8 % (ref 11.5–15.5)
WBC: 5.8 10*3/uL (ref 4.0–10.5)

## 2014-02-23 LAB — POTASSIUM: POTASSIUM: 3.4 meq/L — AB (ref 3.7–5.3)

## 2014-02-23 LAB — URINE MICROSCOPIC-ADD ON

## 2014-02-23 LAB — TROPONIN I: Troponin I: <0.3 ng/mL (ref <0.30)

## 2014-02-23 MED ORDER — MORPHINE SULFATE 4 MG/ML IJ SOLN
4.0000 mg | INTRAMUSCULAR | Status: DC | PRN
  Administered 2014-02-23: 4 mg via INTRAVENOUS
  Filled 2014-02-23: qty 1

## 2014-02-23 MED ORDER — SODIUM CHLORIDE 0.9 % IV SOLN
INTRAVENOUS | Status: DC
  Administered 2014-02-23: 125 mL/h via INTRAVENOUS

## 2014-02-23 MED ORDER — OXYCODONE-ACETAMINOPHEN 5-325 MG PO TABS: ORAL_TABLET | ORAL | Status: DC

## 2014-02-23 MED ORDER — POTASSIUM CHLORIDE 10 MEQ/100ML IV SOLN
10.0000 meq | Freq: Once | INTRAVENOUS | Status: AC
  Administered 2014-02-23: 10 meq via INTRAVENOUS
  Filled 2014-02-23: qty 100

## 2014-02-23 MED ORDER — POTASSIUM CHLORIDE CRYS ER 20 MEQ PO TBCR
40.0000 meq | EXTENDED_RELEASE_TABLET | Freq: Once | ORAL | Status: AC
  Administered 2014-02-23: 40 meq via ORAL
  Filled 2014-02-23: qty 2

## 2014-02-23 MED ORDER — ONDANSETRON HCL 4 MG/2ML IJ SOLN
4.0000 mg | Freq: Once | INTRAMUSCULAR | Status: AC
  Administered 2014-02-23: 4 mg via INTRAVENOUS
  Filled 2014-02-23: qty 2

## 2014-02-23 NOTE — Discharge Instructions (Signed)
Take percocet for breakthrough pain, do not drink alcohol, drive, care for children or do other critical tasks while taking percocet. ° °Please follow with your primary care doctor in the next 2 days for a check-up. They must obtain records for further management.  ° °Do not hesitate to return to the Emergency Department for any new, worsening or concerning symptoms.  ° °

## 2014-02-23 NOTE — ED Provider Notes (Signed)
CSN: [id number]     Arrival date & time 02/23/14  1556 History   First MD Initiated Contact with Patient 02/23/14 1605     Chief Complaint  Patient presents with  . Insect Bite     (Consider location/radiation/quality/duration/timing/severity/associated sxs/prior Treatment) HPI  Shelly Le is a 53 y.o. female with past medical history significant for Addison's disease, diabetes insipidus, seizure disorder complaining of increasing pain and swelling to area of bug bite which she received approximately 5 days ago. Patient reports redness around the area, associated fever with MAXIMUM TEMPERATURE of 101 last night, no antipyretics were taken today. Patient also reports a lower abdominal pain in addition to an upper anterior chest pain which she describes as burning, pleuritic she also states that she is having difficulty opening her left eyelid, is having pain in the temporal area as well. She denies change in vision, dysarthria, shortness of breath, vomiting, change in bowel or bladder habits. On review of systems she endorses increasing ataxia. She has not been to see her primary care physician for 2 months because it is difficult to travel to [facility].  Past Medical History  Diagnosis Date  . Addison's disease   . Diabetes insipidus   . Pneumonia   . Seizures    Past Surgical History  Procedure Laterality Date  . Tonsillectomy    . Abdominal hysterectomy    . Brain surgery      crainotomy   No family history on file. History  Substance Use Topics  . Smoking status: Never Smoker   . Smokeless tobacco: Never Used  . Alcohol Use: No   OB History   Grav Para Term Preterm Abortions TAB SAB Ect Mult Living                 Review of Systems  10 systems reviewed and found to be negative, except as noted in the HPI.   Allergies  Penicillins; Shellfish allergy; and Contrast media  Home Medications   Prior to Admission medications   Medication Sig Start Date End Date Taking?  Authorizing Provider  ALPRAZolam Duanne Moron) 1 MG tablet Take 1 mg by mouth 3 (three) times daily as needed for sleep (slep).     Historical Provider, MD  fentaNYL (DURAGESIC - DOSED MCG/HR) 75 MCG/HR Place 1 patch onto the skin every 3 (three) days.    Historical Provider, MD  gabapentin (NEURONTIN) 300 MG capsule Take 300 mg by mouth 3 (three) times daily.    Historical Provider, MD  hydrocortisone (CORTEF) 20 MG tablet Take 10-20 mg by mouth daily. 20 mg in the morning. 10 mg in the afternoon    Historical Provider, MD  levETIRAcetam (KEPPRA) 1000 MG tablet Take 1,000 mg by mouth 2 (two) times daily.    Historical Provider, MD  levothyroxine (SYNTHROID, LEVOTHROID) 100 MCG tablet Take 100 mcg by mouth daily before breakfast.    Historical Provider, MD  mirtazapine (REMERON) 15 MG tablet Take 15 mg by mouth at bedtime.    Historical Provider, MD  oxyCODONE-acetaminophen (PERCOCET/ROXICET) 5-325 MG per tablet 1 to 2 tabs PO q6hrs  PRN for pain 02/23/14   Elmyra Ricks Krissi Willaims, PA-C  potassium chloride (MICRO-K) 10 MEQ CR capsule Take 10 mEq by mouth daily.  07/13/13   Historical Provider, MD  promethazine (PHENERGAN) 25 MG tablet Take 25 mg by mouth every 6 (six) hours as needed for nausea or vomiting (nausea & vomiting).    Historical Provider, MD  tiZANidine (ZANAFLEX) 4 MG tablet  Take 4 mg by mouth every 6 (six) hours as needed (pain).    Historical Provider, MD   BP 129/67  Pulse 80  Temp(Src) 99.3 F (37.4 C) (Oral)  Resp 18  Ht 5\' 5"  (1.651 m)  Wt 140 lb (63.504 kg)  BMI 23.30 kg/m2  SpO2 98% Physical Exam  Nursing note and vitals reviewed. Constitutional: She is oriented to person, place, and time. She appears well-developed and well-nourished. No distress.  HENT:  Head: Normocephalic and atraumatic.  Mouth/Throat: Oropharynx is clear and moist.  Eyes: Conjunctivae and EOM are normal. Pupils are equal, round, and reactive to light.  Neck: Normal range of motion. Neck supple.   Cardiovascular: Normal rate, regular rhythm and intact distal pulses.   Pulmonary/Chest: Effort normal and breath sounds normal. No stridor. No respiratory distress. She has no wheezes. She has no rales. She exhibits no tenderness.  Abdominal: Soft. Bowel sounds are normal. She exhibits no distension and no mass. There is no tenderness. There is no rebound and no guarding.  Musculoskeletal: Normal range of motion.  2 cm area of Ecchymoses to anterior left thigh, no erythema, induration or warmth.  Neurological: She is alert and oriented to person, place, and time.  Ptosis left eye is distractible.   II-Visual fields grossly intact. III/IV/VI-Extraocular movements intact.  Pupils reactive bilaterally. V/VII-Smile symmetric, equal eyebrow raise,  facial sensation intact VIII- Hearing grossly intact IX/X-Normal gag XI-bilateral shoulder shrug XII-midline tongue extension Motor: 5/5 bilaterally with normal tone and bulk Cerebellar: Normal finger-to-nose  and normal heel-to-shin test.   Romberg negative Ambulates with a coordinated gait   Psychiatric: She has a normal mood and affect.    ED Course  Procedures (including critical care time) Labs Review Labs Reviewed  CBC WITH DIFFERENTIAL - Abnormal; Notable for the following:    Hemoglobin 11.6 (*)    HCT 35.0 (*)    All other components within normal limits  BASIC METABOLIC PANEL - Abnormal; Notable for the following:    Potassium 2.9 (*)    Creatinine, Ser 1.20 (*)    GFR calc non Af Amer 50 (*)    GFR calc Af Amer 58 (*)    Anion gap 16 (*)    All other components within normal limits  URINALYSIS, ROUTINE W REFLEX MICROSCOPIC - Abnormal; Notable for the following:    Ketones, ur 15 (*)    Leukocytes, UA TRACE (*)    All other components within normal limits  POTASSIUM - Abnormal; Notable for the following:    Potassium 3.4 (*)    All other components within normal limits  TROPONIN I  URINE MICROSCOPIC-ADD ON     Imaging Review Dg Chest 2 View  02/23/2014   CLINICAL DATA:  53 year old female with history of cough and weakness. Insect bite on the upper leg.  EXAM: CHEST  2 VIEW  COMPARISON:  Chest x-ray 02/21/2014.  FINDINGS: Lung volumes are normal. No consolidative airspace disease. No pleural effusions. No pneumothorax. No pulmonary nodule or mass noted. Pulmonary vasculature and the cardiomediastinal silhouette are within normal limits.  IMPRESSION: No radiographic evidence of acute cardiopulmonary disease.   Electronically Signed   By: Vinnie Langton M.D.   On: 02/23/2014 17:47   Ct Head Wo Contrast  02/23/2014   CLINICAL DATA:  Patient with ataxia and increased weakness. History of Addison's disease, diabetes, seizures. Status post craniotomy.  EXAM: CT HEAD WITHOUT CONTRAST  TECHNIQUE: Contiguous axial images were obtained from the base of the skull  through the vertex without intravenous contrast.  COMPARISON:  No prior head CT.  FINDINGS: Prior right frontal craniotomy. No intra or extra-axial hemorrhage, mass effect, mass lesion, or hydrocephalus. Negative for acute cortically based infarction. Visualized paranasal sinuses and mastoid air cells are clear.  IMPRESSION: No acute intracranial abnormality.  Prior right frontal craniotomy.   Electronically Signed   By: Curlene Dolphin M.D.   On: 02/23/2014 17:44     EKG Interpretation   Date/Time:  Friday February 23 2014 17:00:14 EDT Ventricular Rate:  93 PR Interval:  122 QRS Duration: 88 QT Interval:  404 QTC Calculation: 502 R Axis:   41 Text Interpretation:  Normal sinus rhythm T wave abnormality, consider  anterior ischemia Prolonged QT no significant change from previous, t wave  changes in precordial leads appear unchanged Confirmed by HARRISON  MD,  FORREST ([id number]) on 02/23/2014 5:03:45 PM      MDM   Final diagnoses:  Cough  Increased weakness when ambulating  Thigh contusion, right, subsequent encounter  Hypokalemia     Filed Vitals:   02/23/14 1602 02/23/14 1947 02/23/14 2023 02/23/14 2030  BP: 130/81 124/78  129/67  Pulse: 98 93  80  Temp: 98.9 F (37.2 C)  99.3 F (37.4 C)   TempSrc: Oral  Oral   Resp: 20 18    Height: 5\' 5"  (1.651 m)     Weight: 140 lb (63.504 kg)     SpO2: 97% 100%  98%    Medications  0.9 %  sodium chloride infusion ( Intravenous Stopped 02/23/14 2050)  morphine 4 MG/ML injection 4 mg (4 mg Intravenous Given 02/23/14 1947)  potassium chloride SA (K-DUR,KLOR-CON) CR tablet 40 mEq (40 mEq Oral Given 02/23/14 1753)  potassium chloride 10 mEq in 100 mL IVPB (0 mEq Intravenous Stopped 02/23/14 1945)  ondansetron (ZOFRAN) injection 4 mg (4 mg Intravenous Given 02/23/14 1822)    Shelly Le is a 52 y.o. female presenting with complaints, her focus seems to be on the upright that she received to the thigh, there is no overlying signs of infection. Patient also reports a ptosis in the left eye and difficulty over my neurologic exam is nonfocal. CT head is ordered  UA CVA. Patient has been having these symptoms for over several days. She's not a code stroke. EKG with no acute changes, troponin is negative, blood work is significant for a potassium of 2.9. Patient states that she has had no nausea vomiting or diarrhea, states she's compliant with her potassium supplementation. Patient will be given 40 mEq by mouth and 10 by IV. Recheck of potassium shows improvement 3.4. I've advised patient it is critically important that she follow with her primary care physician I have offered to refer her to a local physician if [facility] is too far to travel to. Patient has declined and states she will follow closely with her primary care next week.  This is a shared visit with the attending physician who personally evaluated the patient and agrees with the care plan.   Evaluation does not show pathology that would require ongoing emergent intervention or inpatient treatment. Pt is hemodynamically  stable and mentating appropriately. Discussed findings and plan with patient/guardian, who agrees with care plan. All questions answered. Return precautions discussed and outpatient follow up given.   Discharge Medication List as of 02/23/2014  8:40 PM    START taking these medications   Details  oxyCODONE-acetaminophen (PERCOCET/ROXICET) 5-325 MG per tablet 1 to 2 tabs PO q6hrs  PRN for pain, Print             Monico Blitz, PA-C 02/23/14 [place], PA-C 02/23/14 2325

## 2014-02-23 NOTE — ED Notes (Signed)
Insect bite to her right upper leg 2 days ago. She was seen at [facility].

## 2014-02-23 NOTE — ED Notes (Signed)
Shelly Le, [place] notified of pt k+ of 2.9

## 2014-02-23 NOTE — ED Notes (Signed)
Warm Blankets given

## 2014-02-24 NOTE — ED Provider Notes (Signed)
Medical screening examination/treatment/procedure(s) were conducted as a shared visit with non-physician practitioner(s) and myself.  I personally evaluated the patient during the encounter.   EKG Interpretation   Date/Time:  Friday February 23 2014 17:00:14 EDT Ventricular Rate:  93 PR Interval:  122 QRS Duration: 88 QT Interval:  404 QTC Calculation: 502 R Axis:   41 Text Interpretation:  Normal sinus rhythm T wave abnormality, consider  anterior ischemia Prolonged QT no significant change from previous, t wave  changes in precordial leads appear unchanged Confirmed by Devin Ganaway  MD,  Montay Vanvoorhis ([id number]) on 02/23/2014 5:03:45 PM      I interviewed and examined the patient. Lungs are CTAB. Cardiac exam wnl. Abdomen soft.  Pt w/ multiple complaints and poor historian. Ultimately her workup was non-contrib. Do not think pt has CVA. Potassium given. Will plan for close f/u w/ her pcp.   Pamella Pert, MD 02/24/14 1040

## 2014-02-26 ENCOUNTER — Inpatient Hospital Stay (HOSPITAL_COMMUNITY): Payer: Medicare Other

## 2014-02-26 ENCOUNTER — Emergency Department (HOSPITAL_COMMUNITY): Payer: Medicare Other

## 2014-02-26 ENCOUNTER — Inpatient Hospital Stay (HOSPITAL_COMMUNITY)
Admission: EM | Admit: 2014-02-26 | Discharge: 2014-03-06 | DRG: 100 | Disposition: A | Discharge status: Home / Self Care | Payer: Medicare Other | Attending: Oncology | Admitting: Oncology

## 2014-02-26 ENCOUNTER — Encounter (HOSPITAL_COMMUNITY): Payer: Self-pay | Admitting: Emergency Medicine

## 2014-02-26 DIAGNOSIS — J440 Chronic obstructive pulmonary disease with acute lower respiratory infection: Secondary | ICD-10-CM | POA: Diagnosis present

## 2014-02-26 DIAGNOSIS — J029 Acute pharyngitis, unspecified: Secondary | ICD-10-CM | POA: Diagnosis present

## 2014-02-26 DIAGNOSIS — R0602 Shortness of breath: Secondary | ICD-10-CM | POA: Insufficient documentation

## 2014-02-26 DIAGNOSIS — Z90710 Acquired absence of both cervix and uterus: Secondary | ICD-10-CM | POA: Diagnosis not present

## 2014-02-26 DIAGNOSIS — G40401 Other generalized epilepsy and epileptic syndromes, not intractable, with status epilepticus: Principal | ICD-10-CM | POA: Diagnosis present

## 2014-02-26 DIAGNOSIS — E872 Acidosis: Secondary | ICD-10-CM | POA: Diagnosis present

## 2014-02-26 DIAGNOSIS — Z91410 Personal history of adult physical and sexual abuse: Secondary | ICD-10-CM

## 2014-02-26 DIAGNOSIS — E271 Primary adrenocortical insufficiency: Secondary | ICD-10-CM | POA: Diagnosis present

## 2014-02-26 DIAGNOSIS — Z91041 Radiographic dye allergy status: Secondary | ICD-10-CM

## 2014-02-26 DIAGNOSIS — Z8639 Personal history of other endocrine, nutritional and metabolic disease: Secondary | ICD-10-CM | POA: Diagnosis not present

## 2014-02-26 DIAGNOSIS — F419 Anxiety disorder, unspecified: Secondary | ICD-10-CM | POA: Diagnosis present

## 2014-02-26 DIAGNOSIS — Z9114 Patient's other noncompliance with medication regimen: Secondary | ICD-10-CM | POA: Diagnosis present

## 2014-02-26 DIAGNOSIS — R1310 Dysphagia, unspecified: Secondary | ICD-10-CM | POA: Diagnosis present

## 2014-02-26 DIAGNOSIS — M79651 Pain in right thigh: Secondary | ICD-10-CM | POA: Diagnosis present

## 2014-02-26 DIAGNOSIS — R569 Unspecified convulsions: Secondary | ICD-10-CM | POA: Diagnosis present

## 2014-02-26 DIAGNOSIS — Z7952 Long term (current) use of systemic steroids: Secondary | ICD-10-CM | POA: Diagnosis not present

## 2014-02-26 DIAGNOSIS — J189 Pneumonia, unspecified organism: Secondary | ICD-10-CM | POA: Diagnosis present

## 2014-02-26 DIAGNOSIS — R32 Unspecified urinary incontinence: Secondary | ICD-10-CM | POA: Diagnosis present

## 2014-02-26 DIAGNOSIS — E230 Hypopituitarism: Secondary | ICD-10-CM | POA: Insufficient documentation

## 2014-02-26 DIAGNOSIS — I959 Hypotension, unspecified: Secondary | ICD-10-CM | POA: Diagnosis not present

## 2014-02-26 DIAGNOSIS — Z86718 Personal history of other venous thrombosis and embolism: Secondary | ICD-10-CM | POA: Diagnosis not present

## 2014-02-26 DIAGNOSIS — IMO0002 Reserved for concepts with insufficient information to code with codable children: Secondary | ICD-10-CM

## 2014-02-26 DIAGNOSIS — Z79890 Hormone replacement therapy (postmenopausal): Secondary | ICD-10-CM | POA: Diagnosis not present

## 2014-02-26 DIAGNOSIS — M79609 Pain in unspecified limb: Secondary | ICD-10-CM

## 2014-02-26 DIAGNOSIS — E039 Hypothyroidism, unspecified: Secondary | ICD-10-CM | POA: Diagnosis present

## 2014-02-26 DIAGNOSIS — D509 Iron deficiency anemia, unspecified: Secondary | ICD-10-CM | POA: Diagnosis present

## 2014-02-26 DIAGNOSIS — E232 Diabetes insipidus: Secondary | ICD-10-CM | POA: Diagnosis present

## 2014-02-26 DIAGNOSIS — D649 Anemia, unspecified: Secondary | ICD-10-CM | POA: Diagnosis present

## 2014-02-26 DIAGNOSIS — G40901 Epilepsy, unspecified, not intractable, with status epilepticus: Secondary | ICD-10-CM | POA: Insufficient documentation

## 2014-02-26 DIAGNOSIS — E876 Hypokalemia: Secondary | ICD-10-CM | POA: Diagnosis present

## 2014-02-26 DIAGNOSIS — G4700 Insomnia, unspecified: Secondary | ICD-10-CM | POA: Diagnosis present

## 2014-02-26 DIAGNOSIS — R400 Somnolence: Secondary | ICD-10-CM

## 2014-02-26 DIAGNOSIS — R0600 Dyspnea, unspecified: Secondary | ICD-10-CM | POA: Diagnosis present

## 2014-02-26 DIAGNOSIS — E890 Postprocedural hypothyroidism: Secondary | ICD-10-CM | POA: Insufficient documentation

## 2014-02-26 DIAGNOSIS — K5900 Constipation, unspecified: Secondary | ICD-10-CM | POA: Insufficient documentation

## 2014-02-26 DIAGNOSIS — D352 Benign neoplasm of pituitary gland: Secondary | ICD-10-CM | POA: Diagnosis present

## 2014-02-26 DIAGNOSIS — R079 Chest pain, unspecified: Secondary | ICD-10-CM | POA: Insufficient documentation

## 2014-02-26 DIAGNOSIS — E0590 Thyrotoxicosis, unspecified without thyrotoxic crisis or storm: Secondary | ICD-10-CM | POA: Diagnosis present

## 2014-02-26 DIAGNOSIS — Z91013 Allergy to seafood: Secondary | ICD-10-CM

## 2014-02-26 DIAGNOSIS — M7989 Other specified soft tissue disorders: Secondary | ICD-10-CM

## 2014-02-26 HISTORY — DX: Neoplasm of unspecified behavior of brain: D49.6

## 2014-02-26 LAB — CBC WITH DIFFERENTIAL/PLATELET
BASOS PCT: 0 % (ref 0–1)
Basophils Absolute: 0 10*3/uL (ref 0.0–0.1)
EOS ABS: 0.1 10*3/uL (ref 0.0–0.7)
Eosinophils Relative: 2 % (ref 0–5)
HCT: 30.1 % — ABNORMAL LOW (ref 36.0–46.0)
HEMOGLOBIN: 10.1 g/dL — AB (ref 12.0–15.0)
LYMPHS ABS: 2.1 10*3/uL (ref 0.7–4.0)
Lymphocytes Relative: 44 % (ref 12–46)
MCH: 26.9 pg (ref 26.0–34.0)
MCHC: 33.6 g/dL (ref 30.0–36.0)
MCV: 80.1 fL (ref 78.0–100.0)
MONOS PCT: 7 % (ref 3–12)
Monocytes Absolute: 0.3 10*3/uL (ref 0.1–1.0)
NEUTROS ABS: 2.2 10*3/uL (ref 1.7–7.7)
Neutrophils Relative %: 47 % (ref 43–77)
Platelets: 183 10*3/uL (ref 150–400)
RBC: 3.76 MIL/uL — AB (ref 3.87–5.11)
RDW: 13.5 % (ref 11.5–15.5)
WBC: 4.8 10*3/uL (ref 4.0–10.5)

## 2014-02-26 LAB — BASIC METABOLIC PANEL
ANION GAP: 11 (ref 5–15)
BUN: 6 mg/dL (ref 6–23)
CO2: 24 mEq/L (ref 19–32)
CREATININE: 1.03 mg/dL (ref 0.50–1.10)
Calcium: 8.1 mg/dL — ABNORMAL LOW (ref 8.4–10.5)
Chloride: 111 mEq/L (ref 96–112)
GFR calc non Af Amer: 60 mL/min — ABNORMAL LOW (ref >90)
GFR, EST AFRICAN AMERICAN: 70 mL/min — AB (ref >90)
Glucose, Bld: 100 mg/dL — ABNORMAL HIGH (ref 70–99)
Potassium: 3.3 mEq/L — ABNORMAL LOW (ref 3.7–5.3)
Sodium: 146 mEq/L (ref 137–147)

## 2014-02-26 LAB — HEPATIC FUNCTION PANEL
ALT: 8 U/L (ref 0–35)
AST: 19 U/L (ref 0–37)
Albumin: 3.3 g/dL — ABNORMAL LOW (ref 3.5–5.2)
Alkaline Phosphatase: 53 U/L (ref 39–117)
TOTAL PROTEIN: 5.6 g/dL — AB (ref 6.0–8.3)
Total Bilirubin: <0.2 mg/dL — ABNORMAL LOW (ref 0.3–1.2)

## 2014-02-26 LAB — MAGNESIUM: MAGNESIUM: 1.8 mg/dL (ref 1.5–2.5)

## 2014-02-26 LAB — URINALYSIS, ROUTINE W REFLEX MICROSCOPIC
Bilirubin Urine: NEGATIVE
GLUCOSE, UA: NEGATIVE mg/dL
Hgb urine dipstick: NEGATIVE
KETONES UR: 15 mg/dL — AB
LEUKOCYTES UA: NEGATIVE
NITRITE: NEGATIVE
PROTEIN: NEGATIVE mg/dL
Specific Gravity, Urine: 1.014 (ref 1.005–1.030)
Urobilinogen, UA: 0.2 mg/dL (ref 0.0–1.0)
pH: 5.5 (ref 5.0–8.0)

## 2014-02-26 LAB — RAPID URINE DRUG SCREEN, HOSP PERFORMED
Amphetamines: NOT DETECTED
BARBITURATES: NOT DETECTED
BENZODIAZEPINES: NOT DETECTED
COCAINE: NOT DETECTED
Opiates: NOT DETECTED
TETRAHYDROCANNABINOL: NOT DETECTED

## 2014-02-26 LAB — CBG MONITORING, ED: Glucose-Capillary: 117 mg/dL — ABNORMAL HIGH (ref 70–99)

## 2014-02-26 LAB — D-DIMER, QUANTITATIVE (NOT AT ARMC): D DIMER QUANT: 0.52 ug{FEU}/mL — AB (ref 0.00–0.48)

## 2014-02-26 LAB — TSH: TSH: 0.036 u[IU]/mL — AB (ref 0.350–4.500)

## 2014-02-26 LAB — ETHANOL: Alcohol, Ethyl (B): <11 mg/dL (ref 0–11)

## 2014-02-26 LAB — TROPONIN I

## 2014-02-26 LAB — CK: Total CK: 374 U/L — ABNORMAL HIGH (ref 7–177)

## 2014-02-26 MED ORDER — METHYLPREDNISOLONE SODIUM SUCC 125 MG IJ SOLR
125.0000 mg | Freq: Once | INTRAMUSCULAR | Status: AC
  Administered 2014-02-26: 125 mg via INTRAVENOUS
  Filled 2014-02-26: qty 2

## 2014-02-26 MED ORDER — HYDROCODONE-ACETAMINOPHEN 10-325 MG PO TABS
1.0000 | ORAL_TABLET | Freq: Four times a day (QID) | ORAL | Status: DC | PRN
  Administered 2014-02-26 – 2014-02-27 (×2): 1 via ORAL
  Filled 2014-02-26 (×3): qty 1

## 2014-02-26 MED ORDER — ONDANSETRON HCL 4 MG/2ML IJ SOLN
4.0000 mg | Freq: Four times a day (QID) | INTRAMUSCULAR | Status: DC | PRN
  Administered 2014-02-28 – 2014-03-05 (×11): 4 mg via INTRAVENOUS
  Filled 2014-02-26 (×11): qty 2

## 2014-02-26 MED ORDER — TECHNETIUM TO 99M ALBUMIN AGGREGATED
6.0000 | Freq: Once | INTRAVENOUS | Status: AC | PRN
Start: 2014-02-26 — End: 2014-02-26
  Administered 2014-02-26: 6 via INTRAVENOUS

## 2014-02-26 MED ORDER — ALPRAZOLAM 0.5 MG PO TABS
1.0000 mg | ORAL_TABLET | Freq: Every evening | ORAL | Status: DC | PRN
  Administered 2014-02-26 – 2014-02-28 (×4): 1 mg via ORAL
  Filled 2014-02-26 (×5): qty 2

## 2014-02-26 MED ORDER — SODIUM CHLORIDE 0.9 % IV BOLUS (SEPSIS)
1000.0000 mL | Freq: Once | INTRAVENOUS | Status: AC
  Administered 2014-02-26: 1000 mL via INTRAVENOUS

## 2014-02-26 MED ORDER — MIRTAZAPINE 7.5 MG PO TABS: 15.0000 mg | ORAL_TABLET | Freq: Every day | ORAL | Status: DC

## 2014-02-26 MED ORDER — TECHNETIUM TC 99M DIETHYLENETRIAME-PENTAACETIC ACID
40.0000 | Freq: Once | INTRAVENOUS | Status: AC | PRN
Start: 2014-02-26 — End: 2014-02-26

## 2014-02-26 MED ORDER — SODIUM CHLORIDE 0.9 % IV SOLN
INTRAVENOUS | Status: AC
  Administered 2014-02-26: 10:00:00 via INTRAVENOUS

## 2014-02-26 MED ORDER — LEVETIRACETAM IN NACL 1000 MG/100ML IV SOLN
1000.0000 mg | Freq: Once | INTRAVENOUS | Status: AC
  Administered 2014-02-26: 1000 mg via INTRAVENOUS
  Filled 2014-02-26: qty 100

## 2014-02-26 MED ORDER — HYDROCORTISONE 20 MG PO TABS
20.0000 mg | ORAL_TABLET | Freq: Every evening | ORAL | Status: DC
  Administered 2014-02-26 – 2014-02-27 (×2): 20 mg via ORAL
  Filled 2014-02-26 (×4): qty 1

## 2014-02-26 MED ORDER — DIPHENHYDRAMINE HCL 50 MG/ML IJ SOLN
25.0000 mg | Freq: Once | INTRAMUSCULAR | Status: AC
  Administered 2014-02-26: 25 mg via INTRAVENOUS
  Filled 2014-02-26: qty 1

## 2014-02-26 MED ORDER — LORAZEPAM 2 MG/ML IJ SOLN
INTRAMUSCULAR | Status: AC
  Administered 2014-02-26: 2 mg
  Filled 2014-02-26: qty 1

## 2014-02-26 MED ORDER — OLOPATADINE HCL 0.1 % OP SOLN
1.0000 [drp] | Freq: Two times a day (BID) | OPHTHALMIC | Status: DC
  Administered 2014-02-27 – 2014-03-06 (×16): 1 [drp] via OPHTHALMIC
  Filled 2014-02-26 (×3): qty 5

## 2014-02-26 MED ORDER — DESMOPRESSIN ACE RHINAL TUBE 0.01 % NA SOLN: 10.0000 ug | Freq: Two times a day (BID) | NASAL | Status: DC

## 2014-02-26 MED ORDER — LEVOTHYROXINE SODIUM 100 MCG PO TABS
100.0000 ug | ORAL_TABLET | Freq: Every day | ORAL | Status: DC
  Administered 2014-02-27 – 2014-03-06 (×8): 100 ug via ORAL
  Filled 2014-02-26 (×9): qty 1

## 2014-02-26 MED ORDER — DESMOPRESSIN ACE SPRAY REFRIG 0.01 % NA SOLN
1.0000 | Freq: Two times a day (BID) | NASAL | Status: DC
  Administered 2014-02-27 – 2014-03-06 (×15): 1 via NASAL
  Filled 2014-02-26 (×3): qty 5

## 2014-02-26 MED ORDER — GADOBENATE DIMEGLUMINE 529 MG/ML IV SOLN: 13.0000 mL | Freq: Once | INTRAVENOUS | Status: AC | PRN

## 2014-02-26 MED ORDER — IPRATROPIUM-ALBUTEROL 0.5-2.5 (3) MG/3ML IN SOLN
3.0000 mL | Freq: Once | RESPIRATORY_TRACT | Status: AC
  Administered 2014-02-26: 3 mL via RESPIRATORY_TRACT
  Filled 2014-02-26: qty 3

## 2014-02-26 MED ORDER — SODIUM CHLORIDE 0.9 % IJ SOLN
3.0000 mL | Freq: Two times a day (BID) | INTRAMUSCULAR | Status: DC
  Administered 2014-02-28 – 2014-03-02 (×3): 3 mL via INTRAVENOUS

## 2014-02-26 MED ORDER — LORAZEPAM 2 MG/ML IJ SOLN
2.0000 mg | INTRAMUSCULAR | Status: DC | PRN
  Administered 2014-03-02 (×2): 2 mg via INTRAVENOUS
  Filled 2014-02-26: qty 1

## 2014-02-26 MED ORDER — TIZANIDINE HCL 4 MG PO TABS
8.0000 mg | ORAL_TABLET | Freq: Three times a day (TID) | ORAL | Status: DC | PRN
  Administered 2014-02-26 – 2014-03-06 (×17): 8 mg via ORAL
  Filled 2014-02-26 (×24): qty 2

## 2014-02-26 MED ORDER — SODIUM CHLORIDE 0.9 % IV SOLN
INTRAVENOUS | Status: DC
  Administered 2014-02-26 – 2014-02-27 (×2): via INTRAVENOUS
  Administered 2014-02-27: 1000 mL via INTRAVENOUS
  Administered 2014-02-28 – 2014-03-02 (×4): via INTRAVENOUS

## 2014-02-26 MED ORDER — HYDROCORTISONE 20 MG PO TABS
40.0000 mg | ORAL_TABLET | Freq: Every morning | ORAL | Status: DC
  Administered 2014-02-27 – 2014-02-28 (×2): 40 mg via ORAL
  Filled 2014-02-26 (×2): qty 2

## 2014-02-26 MED ORDER — LEVETIRACETAM 750 MG PO TABS
1250.0000 mg | ORAL_TABLET | Freq: Two times a day (BID) | ORAL | Status: DC
  Administered 2014-02-26: 500 mg via ORAL
  Administered 2014-02-27: 750 mg via ORAL
  Administered 2014-02-27 – 2014-03-02 (×7): 1250 mg via ORAL
  Filled 2014-02-26 (×19): qty 1

## 2014-02-26 MED ORDER — ONDANSETRON HCL 4 MG PO TABS
4.0000 mg | ORAL_TABLET | Freq: Four times a day (QID) | ORAL | Status: DC | PRN
  Administered 2014-03-02 – 2014-03-03 (×2): 4 mg via ORAL
  Filled 2014-02-26 (×2): qty 1

## 2014-02-26 MED ORDER — KETOTIFEN FUMARATE 0.025 % OP SOLN
1.0000 [drp] | Freq: Two times a day (BID) | OPHTHALMIC | Status: DC
Start: 2014-02-26 — End: 2014-03-07
  Administered 2014-02-26 – 2014-03-06 (×17): 1 [drp] via OPHTHALMIC
  Filled 2014-02-26 (×2): qty 5

## 2014-02-26 MED ORDER — ONDANSETRON HCL 4 MG/2ML IJ SOLN: 4.0000 mg | Freq: Three times a day (TID) | INTRAMUSCULAR | Status: DC | PRN

## 2014-02-26 MED ORDER — GABAPENTIN 300 MG PO CAPS
300.0000 mg | ORAL_CAPSULE | Freq: Three times a day (TID) | ORAL | Status: DC
  Administered 2014-02-26 – 2014-03-06 (×22): 300 mg via ORAL
  Filled 2014-02-26 (×27): qty 1

## 2014-02-26 MED ORDER — DICLOFENAC SODIUM 1 % TD GEL
2.0000 g | Freq: Every day | TRANSDERMAL | Status: DC | PRN
  Filled 2014-02-26: qty 100

## 2014-02-26 MED ORDER — MAGNESIUM SULFATE 40 MG/ML IJ SOLN
2.0000 g | Freq: Once | INTRAMUSCULAR | Status: AC
  Administered 2014-02-26: 2 g via INTRAVENOUS
  Filled 2014-02-26: qty 50

## 2014-02-26 MED ORDER — ALBUTEROL SULFATE (2.5 MG/3ML) 0.083% IN NEBU
2.5000 mg | INHALATION_SOLUTION | Freq: Four times a day (QID) | RESPIRATORY_TRACT | Status: DC
  Administered 2014-02-26 – 2014-02-27 (×3): 2.5 mg via RESPIRATORY_TRACT
  Filled 2014-02-26 (×3): qty 3

## 2014-02-26 NOTE — Progress Notes (Signed)
*  Preliminary Results* Right lower extremity venous duplex completed. Study was technically difficult due to poor patient cooperation. Right lower extremity is negative for deep vein thrombosis. There is no evidence of right Baker's cyst.  02/26/2014 8:55 AM  Maudry Mayhew, RVT, RDCS, RDMS

## 2014-02-26 NOTE — ED Notes (Signed)
Spoke with EEG on the phone, they will perform test once patient arrives to the floor.

## 2014-02-26 NOTE — ED Notes (Signed)
Neurology at bedside.

## 2014-02-26 NOTE — ED Notes (Signed)
MD at bedside. 

## 2014-02-26 NOTE — Procedures (Signed)
ELECTROENCEPHALOGRAM REPORT  Date of Study: 02/26/2014  Patient's Name: Shelly Le MRN: [id number] Date of Birth: [birth year]  Referring Provider: Dr. Gilles Chiquito  Clinical History:  This is a 53 year old woman with pituitary adenoma s/p resection, seizures admitted with shortness of breath and seizures.    Medications: keppra, neurontin, cortef, synthroid  Technical Summary: A multichannel digital EEG recording measured by the international 10-20 system with electrodes applied with paste and impedances below 5000 ohms performed in our laboratory with EKG monitoring in predominantly drowsy and asleep patient.  Hyperventilation and photic stimulation were not performed.  The digital EEG was referentially recorded, reformatted, and digitally filtered in a variety of bipolar and referential montages for optimal display.    Description: The patient is predominantly drowsy and asleep during the recording.  During brief period of wakefulness, there is a symmetric, medium voltage 8 Hz posterior dominant rhythm that poorly attenuates to eye opening and eye closure.  The record is symmetric.  During drowsiness and sleep, there is an increase in theta slowing of the background.  Vertex waves and symmetric sleep spindles were seen.  There were no epileptiform discharges or electrographic seizures seen.    EKG lead showed sinus tachycardia.  Impression: This predominantly drowsy and asleep EEG is normal.    Clinical Correlation: A normal EEG does not exclude a clinical diagnosis of epilepsy.  If further clinical questions remain, repeat wake EEG may be helpful.  Clinical correlation is advised.   Ellouise Newer, M.D.

## 2014-02-26 NOTE — Consult Note (Signed)
Consult Reason for Krebs Referring Physician: Dr Reather Converse [facility] ED  CC: seizure  HPI: Shelly FORDHAM is an 53 y.o. female history of pituitary adenoma s/p resection ~1yrs ago, Addison's, diabetes insipidus, on steroids daily, seizures on  Keppra 1000mg  BID presents with shortness of breath and seizure. Patient has had fairly persistent shortness of breath for the past week. Unclear etiology. Patient has had GTC seizure x 2 in the past 24hours. Per daughter had a brief GTC event in car ride over. While in the ED had another event of GTC seizure which per daughter lasted around 30 minutes. Post event patient is lethargic but oriented and able to interact appropriately. Daughter notes patient is compliant with her seizure medication. Patient lives alone so unclear how often she is having seizure events. Daughter does note talking to her on the phone a few days ago and noting she sounded confused/disoriented.   Patient is noting a severe left thigh pain. Describes having a bug bite around a week ago and has had severe pain in her thigh since then.    Past Medical History  Diagnosis Date  . Addison's disease   . Diabetes insipidus   . Pneumonia   . Seizures   . Brain tumor     Past Surgical History  Procedure Laterality Date  . Tonsillectomy    . Abdominal hysterectomy    . Brain surgery      crainotomy    No family history on file.  Social History:  reports that she has never smoked. She has never used smokeless tobacco. She reports that she does not drink alcohol or use illicit drugs.  Allergies  Allergen Reactions  . Shellfish Allergy Anaphylaxis  . Penicillins Nausea And Vomiting  . Contrast Media [Iodinated Diagnostic Agents] Rash    Medications: Prior to Admission:  (Not in a hospital admission)  ROS: Out of a complete 14 system review, the patient complains of only the following symptoms, and all other reviewed systems are negative. +pain and SOB  Physical  Examination: Blood pressure 114/56, pulse 101, temperature 97.3 F (36.3 C), temperature source Rectal, resp. rate 15, SpO2 100.00%.  Neurologic Examination Mental Status: Lethargic but easily arousable, oriented x 3, thought content appropriate.  Speech fluent without evidence of aphasia.  Able to follow 3 step commands without difficulty. Cranial Nerves: II: funduscopic exam wnl bilaterally, visual fields grossly normal, pupils equal, round, reactive to light and accommodation III,IV, VI: ptosis not present, extra-ocular motions intact bilaterally V,VII: smile symmetric, facial light touch sensation normal bilaterally VIII: hearing normal bilaterally IX,X: gag reflex present XI: trapezius strength/neck flexion strength normal bilaterally XII: tongue strength normal  Motor: Right : Upper extremity    Left:     Upper extremity 5/5 deltoid       5/5 deltoid 5/5 biceps      5/5 biceps  5/5 triceps      5/5 triceps 5/5 hand grip      5/5 hand grip  Lower extremity     Lower extremity 5/5 hip flexor      4/5 hip flexor * 5/5 quadricep      4/5 quadriceps * 5/5 hamstrings     5/5 hamstrings 5/5 plantar flexion       5/5 plantar flexion 5/5 plantar extension     5/5 plantar extension *LLE weakness likely pain related Tone and bulk:normal tone throughout; no atrophy noted Sensory: Pinprick and light touch intact throughout, bilaterally Deep Tendon Reflexes: 2+ and symmetric throughout  Plantars: Right: downgoing   Left: downgoing Cerebellar: normal finger-to-nose, normal rapid alternating movements and normal heel-to-shin test Gait: deferred due to fall risk  Laboratory Studies:   Basic Metabolic Panel:  Recent Labs Lab 02/21/14 1157 02/23/14 1650 02/23/14 1920 02/26/14 0641  NA 145 143  --  146  K 3.8 2.9* 3.4* 3.3*  CL 104 102  --  111  CO2 23 25  --  24  GLUCOSE 119* 96  --  100*  BUN 9 8  --  6  CREATININE 1.43* 1.20*  --  1.03  CALCIUM 9.3 9.5  --  8.1*  MG 2.1  --    --   --   PHOS 2.2*  --   --   --     Liver Function Tests:  Recent Labs Lab 02/21/14 1157  AST 51*  ALT 10  ALKPHOS 82  BILITOT 0.4  PROT 7.7  ALBUMIN 4.6    Recent Labs Lab 02/21/14 1157  LIPASE 62*   No results found for this basename: AMMONIA,  in the last 168 hours  CBC:  Recent Labs Lab 02/21/14 1157 02/23/14 1650 02/26/14 0641  WBC 7.6 5.8 4.8  NEUTROABS 5.4 3.0 2.2  HGB 11.8* 11.6* 10.1*  HCT 35.1* 35.0* 30.1*  MCV 80.1 79.9 80.1  PLT 284 246 183    Cardiac Enzymes:  Recent Labs Lab 02/23/14 1802 02/26/14 0641  TROPONINI <0.30 <0.30    BNP: No components found with this basename: POCBNP,   CBG:  Recent Labs Lab 02/26/14 [place]*    Microbiology: Results for orders placed during the hospital encounter of 02/21/14  URINE CULTURE     Status: None   Collection Time    02/21/14  2:13 PM      Result Value Ref Range Status   Specimen Description URINE, CLEAN CATCH   Final   Special Requests NONE   Final   Culture  Setup Time     Final   Value: 02/21/2014 22:32     Performed at [facility]     Final   Value: NO GROWTH     Performed at [facility]   Culture     Final   Value: NO GROWTH     Performed at [facility]   Report Status 02/22/2014 FINAL   Final    Coagulation Studies: No results found for this basename: LABPROT, INR,  in the last 72 hours  Urinalysis:  Recent Labs Lab 02/21/14 1220 02/23/14 1725  COLORURINE YELLOW YELLOW  LABSPEC 1.018 1.012  PHURINE 5.5 8.0  GLUCOSEU NEGATIVE NEGATIVE  HGBUR SMALL* NEGATIVE  BILIRUBINUR NEGATIVE NEGATIVE  KETONESUR NEGATIVE 15*  PROTEINUR NEGATIVE NEGATIVE  UROBILINOGEN 0.2 1.0  NITRITE NEGATIVE NEGATIVE  LEUKOCYTESUR MODERATE* TRACE*    Lipid Panel:  No results found for this basename: chol, trig, hdl, cholhdl, vldl, ldlcalc    HgbA1C:  No results found for this basename: HGBA1C    Urine Drug Screen:   No results  found for this basename: labopia, cocainscrnur, labbenz, amphetmu, thcu, labbarb    Alcohol Level: No results found for this basename: ETH,  in the last 168 hours  Other results:  Imaging: Dg Chest Portable 1 View  02/26/2014   CLINICAL DATA:  Initial valuation for shortness of Breath, seizure  EXAM: PORTABLE CHEST - 1 VIEW  COMPARISON:  Prior radiograph from 02/23/2014  FINDINGS: Cardiac and mediastinal silhouettes are stable in size  and contour, and remain within normal limits.  Lungs are hypoinflated with diffuse prominence of the bronchovascular markings. No focal infiltrates identified. There is no overt pulmonary edema. No pleural effusion. No pneumothorax.  No acute osseous abnormality.  IMPRESSION: Shallow lung inflation with secondary bronchovascular crowding. No other acute cardiopulmonary abnormality identified.   Electronically Signed   By: Jeannine Boga M.D.   On: 02/26/2014 07:09     Assessment/Plan:  53y/o woman with history of seizure disorder secondary to prior pituitary adenoma resection presenting with shortness of breath and seizure activity x 2. Daughter notes good compliance on her AED of keppra 1000mg  BID.  1)Increase keppra to 1250mg  BID 2)EEG  3)Check MRI brain with and without contrast 4)Check calcium, magnesium, UA, EtOH, urine toxicology 5)Shortness of breath workup per primary team 6)Neurology will follow  Jim Like, DO [facility] [phone]  If 7pm- 7am, please page neurology on call as listed in [place]. 02/26/2014, 7:31 AM
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
## 2014-02-26 NOTE — ED Notes (Signed)
Pt. reports right upper thigh pain with SOB and seizure episode at home prior to arrival .

## 2014-02-26 NOTE — Progress Notes (Signed)
Routine adult EEG completed, results pending. 

## 2014-02-26 NOTE — ED Provider Notes (Signed)
CSN: [id number]     Arrival date & time 02/26/14  8413 History   First MD Initiated Contact with Patient 02/26/14 0543     Chief Complaint  Patient presents with  . Shortness of Breath  . Leg Pain  . Seizures     (Consider location/radiation/quality/duration/timing/severity/associated sxs/prior Treatment) HPI Comments: 53 year old female with history of Addison's, diabetes insipidus, on steroids daily, seizures, pituitary tumor resection, Keppra use presents with shortness of breath and seizure. Patient has had fairly persistent shortness of breath for the past week. Unable to obtain details except from daughter as patient is seizing. Patient has not complained about chest pain to the daughter. No lung disease history over patient has had pneumonia. Daughter does not know any change in her medicines recently and says she takes medicines regularly. No head injury witness. Patient had a brief generalized seizure prior to arrival and then upon arrival had persistent generalized seizing. This is similar to her previous seizures.   Patient is a 53 y.o. female presenting with shortness of breath, leg pain, and seizures. The history is provided by a relative.  Shortness of Breath Leg Pain Seizures   Past Medical History  Diagnosis Date  . Addison's disease   . Diabetes insipidus   . Pneumonia   . Seizures   . Brain tumor    Past Surgical History  Procedure Laterality Date  . Tonsillectomy    . Abdominal hysterectomy    . Brain surgery      crainotomy   No family history on file. History  Substance Use Topics  . Smoking status: Never Smoker   . Smokeless tobacco: Never Used  . Alcohol Use: No   OB History   Grav Para Term Preterm Abortions TAB SAB Ect Mult Living                 Review of Systems  Unable to perform ROS: Acuity of condition  Respiratory: Positive for shortness of breath.   Neurological: Positive for seizures.      Allergies  Contrast media; Morphine;  Shellfish allergy; Latex; Penicillins; and Metrizamide  Home Medications   Prior to Admission medications   Medication Sig Start Date End Date Taking? Authorizing Provider  alendronate (FOSAMAX) 70 MG tablet Take 70 mg by mouth once a week. Take with a full glass of water on an empty stomach.   Yes Historical Provider, MD  ALPRAZolam Duanne Moron) 1 MG tablet Take 1 mg by mouth 3 (three) times daily as needed for sleep (slep).    Yes Historical Provider, MD  azelastine (OPTIVAR) 0.05 % ophthalmic solution Place 1 drop into both eyes 2 (two) times daily.   Yes Historical Provider, MD  butalbital-acetaminophen-caffeine (FIORICET WITH CODEINE) 50-325-40-30 MG per capsule Take 1 capsule by mouth every 4 (four) hours as needed for headache or migraine.   Yes Historical Provider, MD  Calcium Carb-Cholecalciferol (CALCIUM-VITAMIN D3) 600-500 MG-UNIT CAPS Take 1 tablet by mouth 2 (two) times daily.   Yes Historical Provider, MD  CARBONYL IRON PO Take 1 tablet by mouth daily.   Yes Historical Provider, MD  desmopressin (DDAVP) 0.01 % nasal solution Place 10 mcg into the nose 2 (two) times daily.   Yes Historical Provider, MD  diclofenac sodium (VOLTAREN) 1 % GEL Apply 2 g topically daily as needed (for pain).   Yes Historical Provider, MD  dimenhyDRINATE (DRAMAMINE) 50 MG tablet Take 50 mg by mouth at bedtime as needed (for sleep).   Yes Historical Provider, MD  eszopiclone (LUNESTA) 1 MG TABS tablet Take 2 mg by mouth at bedtime. Take immediately before bedtime   Yes Historical Provider, MD  fentaNYL (DURAGESIC - DOSED MCG/HR) 75 MCG/HR Place 1 patch onto the skin every 3 (three) days.   Yes Historical Provider, MD  FIBER PO Take 2 tablets by mouth 2 (two) times daily.   Yes Historical Provider, MD  gabapentin (NEURONTIN) 300 MG capsule Take 300 mg by mouth 3 (three) times daily.   Yes Historical Provider, MD  HYDROcodone-acetaminophen (NORCO) 10-325 MG per tablet Take 1 tablet by mouth every 6 (six) hours as  needed for moderate pain.   Yes Historical Provider, MD  hydrocortisone (CORTEF) 20 MG tablet Take 10-20 mg by mouth See admin instructions. Take 1 tablet in the morning and 0.5 tablet in the afternoon.   Yes Historical Provider, MD  levETIRAcetam (KEPPRA) 1000 MG tablet Take 1,000 mg by mouth 2 (two) times daily.   Yes Historical Provider, MD  levothyroxine (SYNTHROID, LEVOTHROID) 100 MCG tablet Take 100 mcg by mouth daily before breakfast.   Yes Historical Provider, MD  mirtazapine (REMERON) 15 MG tablet Take 15 mg by mouth at bedtime.   Yes Historical Provider, MD  olopatadine (PATANOL) 0.1 % ophthalmic solution Place 1 drop into both eyes 2 (two) times daily.   Yes Historical Provider, MD  ondansetron (ZOFRAN-ODT) 4 MG disintegrating tablet Take 4 mg by mouth every 6 (six) hours as needed for nausea or vomiting.   Yes Historical Provider, MD  oxyCODONE-acetaminophen (PERCOCET/ROXICET) 5-325 MG per tablet Take 1 tablet by mouth every 6 (six) hours.   Yes Historical Provider, MD  oxyCODONE-acetaminophen (ROXICET) 5-325 MG/5ML solution Take 5 mLs by mouth every 4 (four) hours as needed for severe pain.   Yes Historical Provider, MD  polyethylene glycol (MIRALAX / GLYCOLAX) packet Take 17 g by mouth daily as needed for moderate constipation.   Yes Historical Provider, MD  potassium chloride (K-DUR) 10 MEQ tablet Take 10 mEq by mouth 2 (two) times daily.   Yes Historical Provider, MD  predniSONE (DELTASONE) 5 MG tablet Take 5 mg by mouth daily with breakfast.   Yes Historical Provider, MD  tiZANidine (ZANAFLEX) 4 MG tablet Take 8 mg by mouth every 8 (eight) hours as needed for muscle spasms.   Yes Historical Provider, MD  Vitamin D, Ergocalciferol, (DRISDOL) 50000 UNITS CAPS capsule Take 50,000 Units by mouth every 7 (seven) days.   Yes Historical Provider, MD  zolpidem (AMBIEN) 5 MG tablet Take 5 mg by mouth at bedtime as needed for sleep.   Yes Historical Provider, MD  promethazine (PHENERGAN) 25 MG  tablet Take 25 mg by mouth every 6 (six) hours as needed for nausea or vomiting (nausea & vomiting).    Historical Provider, MD   BP 109/77  Pulse 108  Temp(Src) 97.3 F (36.3 C) (Rectal)  Resp 19  SpO2 100% Physical Exam  Nursing note and vitals reviewed. Constitutional: She appears well-developed. She appears distressed.  HENT:  Head: Normocephalic and atraumatic.  Mild dry mucous membranes  Eyes: Right eye exhibits no discharge. Left eye exhibits no discharge.  Neck: Neck supple. No tracheal deviation present.  Cardiovascular: Regular rhythm.  Tachycardia present.   Pulmonary/Chest: Respiratory distress: mild tachypnea. She has wheezes (exp bilateral).  Abdominal: Soft. She exhibits no distension. There is no tenderness. There is no guarding.  Musculoskeletal: She exhibits tenderness. She exhibits no edema.  Patient has mild tenderness proximal right anterior thigh, neurovascularly intact, no sign of  abscess or infection, mild tenderness to palpation.  Neurological: She is alert.  Patient having generalized rhythmic shaking/seizure activity persistent during examination. Pupils dilated and minimally responsive bilateral. Mild nystagmus of both pupils horizontal. Good muscle tone bilateral, tense muscles during seizure.  Skin: Skin is warm. No rash noted.  Psychiatric:  Generalized seizing then postictal/fatigue    ED Course  Procedures (including critical care time) CRITICAL CARE Performed by: Mariea Clonts   Total critical care time: 40 min  Critical care time was exclusive of separately billable procedures and treating other patients.  Critical care was necessary to treat or prevent imminent or life-threatening deterioration.  Critical care was time spent personally by me on the following activities: development of treatment plan with patient and/or surrogate as well as nursing, discussions with consultants, evaluation of patient's response to treatment, examination of  patient, obtaining history from patient or surrogate, ordering and performing treatments and interventions, ordering and review of laboratory studies, ordering and review of radiographic studies, pulse oximetry and re-evaluation of patient's condition.  Labs Review Labs Reviewed  BASIC METABOLIC PANEL - Abnormal; Notable for the following:    Potassium 3.3 (*)    Glucose, Bld 100 (*)    Calcium 8.1 (*)    GFR calc non Af Amer 60 (*)    GFR calc Af Amer 70 (*)    All other components within normal limits  CBC WITH DIFFERENTIAL - Abnormal; Notable for the following:    RBC 3.76 (*)    Hemoglobin 10.1 (*)    HCT 30.1 (*)    All other components within normal limits  D-DIMER, QUANTITATIVE - Abnormal; Notable for the following:    D-Dimer, Quant 0.52 (*)    All other components within normal limits  CBG MONITORING, ED - Abnormal; Notable for the following:    Glucose-Capillary 117 (*)    All other components within normal limits  TROPONIN I  URINALYSIS, ROUTINE W REFLEX MICROSCOPIC    Imaging Review Dg Chest Portable 1 View  02/26/2014   CLINICAL DATA:  Initial valuation for shortness of Breath, seizure  EXAM: PORTABLE CHEST - 1 VIEW  COMPARISON:  Prior radiograph from 02/23/2014  FINDINGS: Cardiac and mediastinal silhouettes are stable in size and contour, and remain within normal limits.  Lungs are hypoinflated with diffuse prominence of the bronchovascular markings. No focal infiltrates identified. There is no overt pulmonary edema. No pleural effusion. No pneumothorax.  No acute osseous abnormality.  IMPRESSION: Shallow lung inflation with secondary bronchovascular crowding. No other acute cardiopulmonary abnormality identified.   Electronically Signed   By: Jeannine Boga M.D.   On: 02/26/2014 07:09     EKG Interpretation   Date/Time:  Monday February 26 2014 07:45:30 EDT Ventricular Rate:  103 PR Interval:  135 QRS Duration: 83 QT Interval:  349 QTC Calculation: 457 R  Axis:   53 Text Interpretation:  Sinus tachycardia Low voltage, extremity leads  Confirmed by Valerye Kobus  MD, Mateus Rewerts ([id number]) on 02/26/2014 8:00:20 AM      MDM   Final diagnoses:  SOB (shortness of breath)  Status epilepticus, generalized convulsive   Patient seizure history presents with persistent generalized seizure activity. Patient sees for persistent amount of time despite 2 rounds of Ativan 2 mg. Loaded Keppra 1000 mg. Discussed the case with neurology on-call will evaluate the patient and recommends Vimpat 200 mg IV seizing worsens.  Albuterol nab given for mild expiratory wheezing. Multiple rechecks, patient seizing improved, mild postictal. Neurology evaluated. Patient  improved in the ER, stable for telemetry, loaded with Keppra. Discussed with neurology and with medicine for admission. Patient has allergy to contrast dye, VQ scan pending, ultrasound pending. Plan for admission to telemetry, EEG per neurology. With shortness of breath worsening recently plantar chest x-ray for pneumonia, blood work, EKG and d-dimer for signs of blood clot.  Differential diagnosis were considered with the presenting HPI.  Medications  0.9 %  sodium chloride infusion (not administered)  ondansetron (ZOFRAN) injection 4 mg (not administered)  LORazepam (ATIVAN) 2 MG/ML injection (2 mg  Given 02/26/14 0541)  levETIRAcetam (KEPPRA) IVPB 1000 mg/100 mL premix (1,000 mg Intravenous Given 02/26/14 0550)  LORazepam (ATIVAN) 2 MG/ML injection (2 mg  Given 02/26/14 0545)  sodium chloride 0.9 % bolus 1,000 mL (0 mLs Intravenous Stopped 02/26/14 0631)  methylPREDNISolone sodium succinate (SOLU-MEDROL) 125 mg/2 mL injection 125 mg (125 mg Intravenous Given 02/26/14 0552)  ipratropium-albuterol (DUONEB) 0.5-2.5 (3) MG/3ML nebulizer solution 3 mL (3 mLs Nebulization Given 02/26/14 0800)  diphenhydrAMINE (BENADRYL) injection 25 mg (25 mg Intravenous Given 02/26/14 0820)    Filed Vitals:   02/26/14 0715  02/26/14 0720 02/26/14 0721 02/26/14 0745  BP: 114/56  114/56 109/77  Pulse: 92   108  Temp:  97.3 F (36.3 C)    TempSrc:  Rectal    Resp: 17  15 19   SpO2: 100%  100% 100%    Final diagnoses:  SOB (shortness of breath)  Status epilepticus, generalized convulsive    Admission/ observation were discussed with the admitting physician, patient and/or family and they are comfortable with the plan.      Mariea Clonts, MD 02/26/14 [phone]

## 2014-02-27 DIAGNOSIS — D510 Vitamin B12 deficiency anemia due to intrinsic factor deficiency: Secondary | ICD-10-CM

## 2014-02-27 DIAGNOSIS — M79651 Pain in right thigh: Secondary | ICD-10-CM

## 2014-02-27 DIAGNOSIS — Z91419 Personal history of unspecified adult abuse: Secondary | ICD-10-CM

## 2014-02-27 DIAGNOSIS — D509 Iron deficiency anemia, unspecified: Secondary | ICD-10-CM

## 2014-02-27 DIAGNOSIS — G4700 Insomnia, unspecified: Secondary | ICD-10-CM

## 2014-02-27 DIAGNOSIS — F419 Anxiety disorder, unspecified: Secondary | ICD-10-CM

## 2014-02-27 DIAGNOSIS — G40409 Other generalized epilepsy and epileptic syndromes, not intractable, without status epilepticus: Secondary | ICD-10-CM

## 2014-02-27 DIAGNOSIS — R0600 Dyspnea, unspecified: Secondary | ICD-10-CM

## 2014-02-27 DIAGNOSIS — E232 Diabetes insipidus: Secondary | ICD-10-CM

## 2014-02-27 DIAGNOSIS — E0590 Thyrotoxicosis, unspecified without thyrotoxic crisis or storm: Secondary | ICD-10-CM

## 2014-02-27 LAB — T4, FREE: Free T4: 1.19 ng/dL (ref 0.80–1.80)

## 2014-02-27 LAB — BASIC METABOLIC PANEL
ANION GAP: 9 (ref 5–15)
BUN: 6 mg/dL (ref 6–23)
CO2: 24 mEq/L (ref 19–32)
CREATININE: 0.84 mg/dL (ref 0.50–1.10)
Calcium: 8 mg/dL — ABNORMAL LOW (ref 8.4–10.5)
Chloride: 109 mEq/L (ref 96–112)
GFR calc non Af Amer: 77 mL/min — ABNORMAL LOW (ref >90)
GFR, EST AFRICAN AMERICAN: 90 mL/min — AB (ref >90)
Glucose, Bld: 111 mg/dL — ABNORMAL HIGH (ref 70–99)
Potassium: 3.7 mEq/L (ref 3.7–5.3)
Sodium: 142 mEq/L (ref 137–147)

## 2014-02-27 LAB — RAPID STREP SCREEN (MED CTR MEBANE ONLY): Streptococcus, Group A Screen (Direct): NEGATIVE

## 2014-02-27 LAB — CBC
HCT: 27.6 % — ABNORMAL LOW (ref 36.0–46.0)
HEMOGLOBIN: 9.3 g/dL — AB (ref 12.0–15.0)
MCH: 26.8 pg (ref 26.0–34.0)
MCHC: 33.7 g/dL (ref 30.0–36.0)
MCV: 79.5 fL (ref 78.0–100.0)
Platelets: 291 10*3/uL (ref 150–400)
RBC: 3.47 MIL/uL — AB (ref 3.87–5.11)
RDW: 13.8 % (ref 11.5–15.5)
WBC: 7.7 10*3/uL (ref 4.0–10.5)

## 2014-02-27 LAB — HIV ANTIBODY (ROUTINE TESTING W REFLEX): HIV 1&2 Ab, 4th Generation: NONREACTIVE

## 2014-02-27 MED ORDER — IPRATROPIUM-ALBUTEROL 0.5-2.5 (3) MG/3ML IN SOLN
3.0000 mL | RESPIRATORY_TRACT | Status: DC | PRN
  Administered 2014-02-27 – 2014-03-06 (×19): 3 mL via RESPIRATORY_TRACT
  Filled 2014-02-27 (×21): qty 3

## 2014-02-27 MED ORDER — OXYCODONE HCL ER 10 MG PO T12A
20.0000 mg | EXTENDED_RELEASE_TABLET | Freq: Two times a day (BID) | ORAL | Status: DC
  Administered 2014-02-27 – 2014-02-28 (×2): 20 mg via ORAL
  Filled 2014-02-27 (×2): qty 2

## 2014-02-27 MED ORDER — ALBUTEROL SULFATE (2.5 MG/3ML) 0.083% IN NEBU
2.5000 mg | INHALATION_SOLUTION | Freq: Four times a day (QID) | RESPIRATORY_TRACT | Status: DC | PRN
  Administered 2014-02-27: 2.5 mg via RESPIRATORY_TRACT
  Filled 2014-02-27: qty 3

## 2014-02-27 MED ORDER — MENTHOL 3 MG MT LOZG
1.0000 | LOZENGE | OROMUCOSAL | Status: DC | PRN
  Filled 2014-02-27: qty 9

## 2014-02-27 MED ORDER — SODIUM CHLORIDE 0.9 % IV BOLUS (SEPSIS)
500.0000 mL | Freq: Once | INTRAVENOUS | Status: AC
  Administered 2014-02-27: 500 mL via INTRAVENOUS

## 2014-02-27 MED ORDER — GUAIFENESIN-DM 100-10 MG/5ML PO SYRP
5.0000 mL | ORAL_SOLUTION | ORAL | Status: DC | PRN
  Administered 2014-02-27 – 2014-03-01 (×4): 5 mL via ORAL
  Filled 2014-02-27 (×5): qty 5

## 2014-02-27 MED ORDER — OXYCODONE HCL 5 MG PO TABS
10.0000 mg | ORAL_TABLET | Freq: Four times a day (QID) | ORAL | Status: DC | PRN
  Administered 2014-02-28 – 2014-03-01 (×2): 10 mg via ORAL
  Filled 2014-02-27 (×2): qty 2

## 2014-02-27 MED ORDER — PHENOL 1.4 % MT LIQD
1.0000 | OROMUCOSAL | Status: DC | PRN
  Administered 2014-02-28: 1 via OROMUCOSAL
  Filled 2014-02-27: qty 177

## 2014-02-27 NOTE — Progress Notes (Addendum)
  Date: 02/27/2014  Patient name: Shelly Le  Medical record number: [id number]  Date of birth: [birth year]   I have seen and evaluated Shelly Le and discussed their care with the Residency Team.   Assessment and Plan: I have seen and evaluated the patient as outlined above. I agree with the formulated Assessment and Plan as detailed in the residents' admission note, with the following changes:   1. Seizure - Agree with plan as outlined in resident note  2. Anemia, 13 -> 9 - Noted to be 13 about a year ago, baseline less clear - Would investigate with iron panel (reported to be iron deficient) and start on therapy as needed  3. Dyspnea/sore throat - Strep screen - VQ scan noted to be normal - Consider respiratory virus panle  4. Rt thigh pain, swelling - Exam appears improved - Monitor  5. Panhypopit - Stress dose steroids and continue home meds  Other issues per resident note.   Sid Falcon, MD 10/27/20153:33 PM

## 2014-02-27 NOTE — Progress Notes (Addendum)
Paged by RN that patient is having cough and congestion. Evaluated patient at bedside. She reports shortness of breath and sore throat. She has been having constant chest pain for the past week that she feels is worse. She has a non productive cough. Albuterol prn has been helping a little. She has a runny nose.  Filed Vitals:   02/27/14 0742 02/27/14 1137 02/27/14 1933 02/27/14 2051  BP: 112/71   114/61  Pulse: 96   96  Temp: 98.3 F (36.8 C)   99.1 F (37.3 C)  TempSrc: Oral   Oral  Resp:    18  Height:  5\' 5"  (1.651 m)    Weight:      SpO2: 99%  100% 99%   General: mild distress Chest: Course rhonchi throughout, no wheezes or crackles. Remains on RA.  CBC Latest Ref Rng 02/27/2014 02/26/2014 02/23/2014  WBC 4.0 - 10.5 K/uL 7.7 4.8 5.8  Hemoglobin 12.0 - 15.0 g/dL 9.3(L) 10.1(L) 11.6(L)  Hematocrit 36.0 - 46.0 % 27.6(L) 30.1(L) 35.0(L)  Platelets 150 - 400 K/uL 291 183 246   Group A Strep 02/27/2014 Negative  A/P: Viral URI versus reactive airway disease. She has remained afebrile with no leukocytosis. O2 sat 99-100% on RA.  -Recheck EKG -Changed albuterol prn to duoneb Q4hr prn -Robitussin DM prn cough -Cepacol prn sore throat -If patient develops fever, will do repeat CXR, BCx  Jacques Earthly, PGY1 Internal Medicine Teaching Service

## 2014-02-27 NOTE — Progress Notes (Signed)
UR Completed [place] L[place]r Graves-Bigelow, RN,BSN [phone]  

## 2014-02-27 NOTE — Care Management Note (Unsigned)
    Page 1 of 1   03/01/2014     10:31:24 AM CARE MANAGEMENT NOTE 03/01/2014  Patient:  Shelly Le, Shelly Le   Account Number:  [id number]  Date Initiated:  02/27/2014  Documentation initiated by:  GRAVES-BIGELOW,Merton Wadlow  Subjective/Objective Assessment:   Pt admitted for Seizure, dyspnea and right thigh pain.     Action/Plan:   CM to monitor for disposition needs.   Anticipated DC Date:  02/28/2014   Anticipated DC Plan:  [place]  CM consult      Choice offered to / List presented to:             Status of service:  In process, will continue to follow Medicare Important Message given?  YES (If response is "NO", the following Medicare IM given date fields will be blank) Date Medicare IM given:  03/01/2014 Medicare IM given by:  GRAVES-BIGELOW,Martha [place] Date Additional Medicare IM given:   Additional Medicare IM given by:    Discharge Disposition:    Per UR Regulation:  Reviewed for med. necessity/level of care/duration of stay  If discussed at [place] of Stay Meetings, dates discussed:    Comments:  03-01-14 [place], RN, BSN [phone] Pt on IV Rocephin- Hopefully plan for d/c within 24-48 hrs. CM will continue to monitor for disposition needs.

## 2014-02-27 NOTE — Progress Notes (Signed)
  I have seen and examined the patient, and reviewed the daily progress note by Wyatt Mage, MS 3 and discussed the care of the patient with them. Please see my progress note from 02/27/2014 for further details regarding assessment and plan.    Signed:  Charlott Rakes, MD 02/27/2014, 5:38 PM
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
## 2014-02-27 NOTE — Progress Notes (Signed)
NT notified of low BP from 0017, rechecked by RN, still low BP. MD notified and orders requested. Patient aysmptomatic at this time. Sleeping but arousable. Will continue to monitor closely and await orders.
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
## 2014-02-28 ENCOUNTER — Inpatient Hospital Stay (HOSPITAL_COMMUNITY): Payer: Medicare Other

## 2014-02-28 DIAGNOSIS — I959 Hypotension, unspecified: Secondary | ICD-10-CM

## 2014-02-28 LAB — CBC
HCT: 28.5 % — ABNORMAL LOW (ref 36.0–46.0)
Hemoglobin: 9.2 g/dL — ABNORMAL LOW (ref 12.0–15.0)
MCH: 26.3 pg (ref 26.0–34.0)
MCHC: 32.3 g/dL (ref 30.0–36.0)
MCV: 81.4 fL (ref 78.0–100.0)
PLATELETS: 167 10*3/uL (ref 150–400)
RBC: 3.5 MIL/uL — ABNORMAL LOW (ref 3.87–5.11)
RDW: 14.2 % (ref 11.5–15.5)
WBC: 7.2 10*3/uL (ref 4.0–10.5)

## 2014-02-28 LAB — BASIC METABOLIC PANEL
ANION GAP: 12 (ref 5–15)
BUN: 6 mg/dL (ref 6–23)
CALCIUM: 7.9 mg/dL — AB (ref 8.4–10.5)
CO2: 22 meq/L (ref 19–32)
Chloride: 106 mEq/L (ref 96–112)
Creatinine, Ser: 0.77 mg/dL (ref 0.50–1.10)
Glucose, Bld: 108 mg/dL — ABNORMAL HIGH (ref 70–99)
Potassium: 3.5 mEq/L — ABNORMAL LOW (ref 3.7–5.3)
SODIUM: 140 meq/L (ref 137–147)

## 2014-02-28 LAB — RETICULOCYTES
RBC.: 3.11 MIL/uL — ABNORMAL LOW (ref 3.87–5.11)
RETIC COUNT ABSOLUTE: 31.1 10*3/uL (ref 19.0–186.0)
RETIC CT PCT: 1 % (ref 0.4–3.1)

## 2014-02-28 LAB — IRON AND TIBC
Iron: 33 ug/dL — ABNORMAL LOW (ref 42–135)
Saturation Ratios: 19 % — ABNORMAL LOW (ref 20–55)
TIBC: 170 ug/dL — ABNORMAL LOW (ref 250–470)
UIBC: 137 ug/dL (ref 125–400)

## 2014-02-28 LAB — FERRITIN: Ferritin: 58 ng/mL (ref 10–291)

## 2014-02-28 LAB — INFLUENZA PANEL BY PCR (TYPE A & B)
H1N1FLUPCR: NOT DETECTED
INFLAPCR: NEGATIVE
INFLBPCR: NEGATIVE

## 2014-02-28 LAB — VITAMIN B12: Vitamin B-12: 294 pg/mL (ref 211–911)

## 2014-02-28 LAB — FOLATE: Folate: 12.8 ng/mL

## 2014-02-28 MED ORDER — ENOXAPARIN SODIUM 40 MG/0.4ML ~~LOC~~ SOLN
40.0000 mg | SUBCUTANEOUS | Status: DC
  Administered 2014-02-28 – 2014-03-06 (×7): 40 mg via SUBCUTANEOUS
  Filled 2014-02-28 (×7): qty 0.4

## 2014-02-28 MED ORDER — HYDROCORTISONE 20 MG PO TABS
30.0000 mg | ORAL_TABLET | Freq: Every evening | ORAL | Status: DC
  Administered 2014-02-28 – 2014-03-06 (×6): 30 mg via ORAL
  Filled 2014-02-28 (×7): qty 1

## 2014-02-28 MED ORDER — DEXTROSE 5 % IV SOLN
1.0000 g | INTRAVENOUS | Status: DC
  Administered 2014-02-28: 1 g via INTRAVENOUS
  Filled 2014-02-28 (×2): qty 10

## 2014-02-28 MED ORDER — SODIUM CHLORIDE 0.9 % IV SOLN
INTRAVENOUS | Status: AC
  Administered 2014-02-28 (×2): via INTRAVENOUS

## 2014-02-28 MED ORDER — HYDROCORTISONE 20 MG PO TABS
60.0000 mg | ORAL_TABLET | Freq: Every morning | ORAL | Status: DC
  Administered 2014-03-01 – 2014-03-06 (×6): 60 mg via ORAL
  Filled 2014-02-28 (×6): qty 3

## 2014-02-28 MED ORDER — OXYCODONE HCL ER 10 MG PO T12A
10.0000 mg | EXTENDED_RELEASE_TABLET | Freq: Two times a day (BID) | ORAL | Status: DC
  Administered 2014-02-28 – 2014-03-02 (×4): 10 mg via ORAL
  Filled 2014-02-28 (×4): qty 1

## 2014-02-28 MED ORDER — SODIUM CHLORIDE 0.45 % IV BOLUS
500.0000 mL | Freq: Once | INTRAVENOUS | Status: AC
  Administered 2014-02-28: 500 mL via INTRAVENOUS

## 2014-02-28 MED ORDER — AZITHROMYCIN 250 MG PO TABS
500.0000 mg | ORAL_TABLET | ORAL | Status: DC
  Administered 2014-02-28: 500 mg via ORAL
  Filled 2014-02-28: qty 2

## 2014-02-28 NOTE — Progress Notes (Signed)
SLP Cancellation Note  Patient Details Name: Shelly Le MRN: [id number] DOB: December 28, 1960   Cancelled treatment:       Reason Eval/Treat Not Completed: Medical issues which prohibited therapy (per RN, change in mentation this am. )  Gabriel Rainwater [place], CCC-SLP [phone]  [name] Meryl 02/28/2014, 11:07 AM

## 2014-02-28 NOTE — Progress Notes (Signed)
Noted consult for pharmacy to adjust antibiotics based on renal function.  Azithromycin and ceftriaxone do not require any renal adjustment.  Pharmacy will sign off at this time. Please re-consult if needed.  Thanks, Almeta Monas. Takoda Janowiak, PharmD, BCPS Clinical Pharmacist Pager: [phone] 02/28/2014 6:43 PM
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
## 2014-02-28 NOTE — Progress Notes (Signed)
Pt is less responsive approx 1 Hr after receiving her 10 AM medicine Including Oxycodone 20mg , she is lethargic with BP 80-90's/ 50's, the rest of VS are WNL. Md notified and gave order for 500cc NS bolus. Will continue monitoring.  Ferdinand Lango, RN

## 2014-02-28 NOTE — Progress Notes (Signed)
  Date: 02/28/2014  Patient name: Shelly Le  Medical record number: [id number]  Date of birth: 1960/09/10   This patient has been seen and the plan of care was discussed with the house staff. Please see their note for complete details. I concur with their findings with the following additions/corrections:  Repeat CXR to assess for evolving syndrome.  She has had low BP, but no fever and no tachycardia.  Etiology could be an underlying infection (imunosuppressed due to chronic steroids possibly) vs. Under-doses steroids given acute illness vs. Dehydration (not eating due to sore throat).  Will plan to evaluate swallowing with speech therapy today and give IVF today. We will also increase her steroid dose as she reports self increasing her dose at home, so what we are giving her may not truly be stress dosed.  Re-evaluate this afternoon as she was sleepy this AM.   Sid Falcon, MD 02/28/2014, 3:22 PM
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
## 2014-02-28 NOTE — Progress Notes (Signed)
  I have seen and examined the patient, and reviewed the daily progress note by Wyatt Mage, MS 3 and discussed the care of the patient with them. Please see my progress note from 02/28/2014 for further details regarding assessment and plan.    Signed:  Charlott Rakes, MD 02/28/2014, 5:26 PM

## 2014-03-01 DIAGNOSIS — J189 Pneumonia, unspecified organism: Secondary | ICD-10-CM | POA: Diagnosis present

## 2014-03-01 LAB — BASIC METABOLIC PANEL
ANION GAP: 15 (ref 5–15)
BUN: 6 mg/dL (ref 6–23)
CO2: 22 mEq/L (ref 19–32)
Calcium: 8.3 mg/dL — ABNORMAL LOW (ref 8.4–10.5)
Chloride: 101 mEq/L (ref 96–112)
Creatinine, Ser: 0.79 mg/dL (ref 0.50–1.10)
GFR calc Af Amer: >90 mL/min (ref >90)
Glucose, Bld: 117 mg/dL — ABNORMAL HIGH (ref 70–99)
Potassium: 3 mEq/L — ABNORMAL LOW (ref 3.7–5.3)
SODIUM: 138 meq/L (ref 137–147)

## 2014-03-01 LAB — CULTURE, GROUP A STREP

## 2014-03-01 LAB — CBC
HCT: 33.3 % — ABNORMAL LOW (ref 36.0–46.0)
Hemoglobin: 10.9 g/dL — ABNORMAL LOW (ref 12.0–15.0)
MCH: 26.4 pg (ref 26.0–34.0)
MCHC: 32.7 g/dL (ref 30.0–36.0)
MCV: 80.6 fL (ref 78.0–100.0)
PLATELETS: 170 10*3/uL (ref 150–400)
RBC: 4.13 MIL/uL (ref 3.87–5.11)
RDW: 13.8 % (ref 11.5–15.5)
WBC: 7.5 10*3/uL (ref 4.0–10.5)

## 2014-03-01 MED ORDER — ALPRAZOLAM 0.5 MG PO TABS
1.0000 mg | ORAL_TABLET | Freq: Three times a day (TID) | ORAL | Status: DC | PRN
  Administered 2014-03-01 – 2014-03-06 (×13): 1 mg via ORAL
  Filled 2014-03-01 (×14): qty 2

## 2014-03-01 MED ORDER — AZITHROMYCIN 250 MG PO TABS
500.0000 mg | ORAL_TABLET | ORAL | Status: DC
  Administered 2014-03-01: 500 mg via ORAL
  Filled 2014-03-01: qty 2

## 2014-03-01 MED ORDER — DEXTROSE 5 % IV SOLN
1.0000 g | Freq: Once | INTRAVENOUS | Status: AC
  Administered 2014-03-01: 1 g via INTRAVENOUS

## 2014-03-01 MED ORDER — PANTOPRAZOLE SODIUM 40 MG PO TBEC
40.0000 mg | DELAYED_RELEASE_TABLET | Freq: Every day | ORAL | Status: DC
  Administered 2014-03-01 – 2014-03-02 (×2): 40 mg via ORAL
  Filled 2014-03-01 (×2): qty 1

## 2014-03-01 MED ORDER — POTASSIUM CHLORIDE CRYS ER 20 MEQ PO TBCR
40.0000 meq | EXTENDED_RELEASE_TABLET | Freq: Once | ORAL | Status: AC
  Administered 2014-03-01: 40 meq via ORAL
  Filled 2014-03-01: qty 2

## 2014-03-01 NOTE — Progress Notes (Signed)
  Date: 03/01/2014  Patient name: Shelly Le  Medical record number: [id number]  Date of birth: 1961-05-03   This patient has been seen and the plan of care was discussed with the house staff. Please see their note for complete details. I concur with their findings with the following additions/corrections:  Please note that the patient is also on antibiotics including Rocephin and Azithromycin for her CAP.  The team was discussing transitioning her to an oral cephalosporin for discharge.  Likely course of therapy would be 7 days total of antibiotics.  She has also been continued on her pain medications, except for fentanyl patch, which lowers the seizure threshold.  Finally, upon discussion with her today, I believe she may be experiencing panic attacks with difficulty breathing when thinking about her many medical issues, and she requested increasing the frequency of her Xanax, which will be done only while she is in the hospital.  She should not have Rx for her anxiolytics or pain medications from these providers on discharge as she is provided with these medications by her PCP and this physician should continue them.    Sid Falcon, MD 03/01/2014, 4:09 PM
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
## 2014-03-02 ENCOUNTER — Inpatient Hospital Stay (HOSPITAL_COMMUNITY): Payer: Medicare Other

## 2014-03-02 DIAGNOSIS — E271 Primary adrenocortical insufficiency: Secondary | ICD-10-CM

## 2014-03-02 DIAGNOSIS — G40301 Generalized idiopathic epilepsy and epileptic syndromes, not intractable, with status epilepticus: Secondary | ICD-10-CM

## 2014-03-02 DIAGNOSIS — E872 Acidosis: Secondary | ICD-10-CM

## 2014-03-02 DIAGNOSIS — E039 Hypothyroidism, unspecified: Secondary | ICD-10-CM

## 2014-03-02 DIAGNOSIS — R400 Somnolence: Secondary | ICD-10-CM

## 2014-03-02 LAB — URINALYSIS, ROUTINE W REFLEX MICROSCOPIC
Bilirubin Urine: NEGATIVE
Glucose, UA: NEGATIVE mg/dL
HGB URINE DIPSTICK: NEGATIVE
Ketones, ur: NEGATIVE mg/dL
NITRITE: NEGATIVE
Protein, ur: NEGATIVE mg/dL
Specific Gravity, Urine: 1.011 (ref 1.005–1.030)
UROBILINOGEN UA: 0.2 mg/dL (ref 0.0–1.0)
pH: 7 (ref 5.0–8.0)

## 2014-03-02 LAB — BASIC METABOLIC PANEL
Anion gap: 20 — ABNORMAL HIGH (ref 5–15)
Anion gap: 12 (ref 5–15)
BUN: 7 mg/dL (ref 6–23)
BUN: 6 mg/dL (ref 6–23)
CHLORIDE: 104 meq/L (ref 96–112)
CHLORIDE: 103 meq/L (ref 96–112)
CO2: 17 mEq/L — ABNORMAL LOW (ref 19–32)
CO2: 25 mEq/L (ref 19–32)
CREATININE: 0.8 mg/dL (ref 0.50–1.10)
Calcium: 8.5 mg/dL (ref 8.4–10.5)
Calcium: 9 mg/dL (ref 8.4–10.5)
Creatinine, Ser: 0.71 mg/dL (ref 0.50–1.10)
GFR calc non Af Amer: >90 mL/min (ref >90)
GFR calc non Af Amer: 82 mL/min — ABNORMAL LOW (ref >90)
Glucose, Bld: 89 mg/dL (ref 70–99)
Glucose, Bld: 93 mg/dL (ref 70–99)
POTASSIUM: 3.2 meq/L — AB (ref 3.7–5.3)
Potassium: 4.2 mEq/L (ref 3.7–5.3)
Sodium: 141 mEq/L (ref 137–147)
Sodium: 140 mEq/L (ref 137–147)

## 2014-03-02 LAB — CBC
HCT: 35.7 % — ABNORMAL LOW (ref 36.0–46.0)
Hemoglobin: 12.1 g/dL (ref 12.0–15.0)
MCH: 26.9 pg (ref 26.0–34.0)
MCHC: 33.9 g/dL (ref 30.0–36.0)
MCV: 79.3 fL (ref 78.0–100.0)
PLATELETS: 177 10*3/uL (ref 150–400)
RBC: 4.5 MIL/uL (ref 3.87–5.11)
RDW: 13.9 % (ref 11.5–15.5)
WBC: 6.7 10*3/uL (ref 4.0–10.5)

## 2014-03-02 LAB — HEPATIC FUNCTION PANEL
ALBUMIN: 3.3 g/dL — AB (ref 3.5–5.2)
ALK PHOS: 105 U/L (ref 39–117)
ALT: 62 U/L — ABNORMAL HIGH (ref 0–35)
AST: 82 U/L — ABNORMAL HIGH (ref 0–37)
Total Protein: 6.7 g/dL (ref 6.0–8.3)

## 2014-03-02 LAB — LACTIC ACID, PLASMA: Lactic Acid, Venous: 1.3 mmol/L (ref 0.5–2.2)

## 2014-03-02 LAB — URINE MICROSCOPIC-ADD ON

## 2014-03-02 LAB — MRSA PCR SCREENING: MRSA BY PCR: NEGATIVE

## 2014-03-02 LAB — STREP PNEUMONIAE URINARY ANTIGEN: STREP PNEUMO URINARY ANTIGEN: NEGATIVE

## 2014-03-02 MED ORDER — FENTANYL CITRATE 0.05 MG/ML IJ SOLN
INTRAMUSCULAR | Status: AC
  Filled 2014-03-02: qty 2

## 2014-03-02 MED ORDER — CEFDINIR 300 MG PO CAPS: 300.0000 mg | ORAL_CAPSULE | Freq: Two times a day (BID) | ORAL | Status: DC

## 2014-03-02 MED ORDER — MIDAZOLAM HCL 2 MG/2ML IJ SOLN
INTRAMUSCULAR | Status: AC
  Filled 2014-03-02: qty 4

## 2014-03-02 MED ORDER — POTASSIUM CHLORIDE CRYS ER 20 MEQ PO TBCR
40.0000 meq | EXTENDED_RELEASE_TABLET | ORAL | Status: AC
  Administered 2014-03-02: 40 meq via ORAL
  Filled 2014-03-02: qty 2

## 2014-03-02 MED ORDER — HYDROCORTISONE 20 MG PO TABS: ORAL_TABLET | ORAL | Status: DC

## 2014-03-02 MED ORDER — AZITHROMYCIN 500 MG PO TABS
500.0000 mg | ORAL_TABLET | ORAL | Status: AC
  Administered 2014-03-02 – 2014-03-06 (×5): 500 mg via ORAL
  Filled 2014-03-02 (×4): qty 1
  Filled 2014-03-02: qty 2

## 2014-03-02 MED ORDER — LORAZEPAM 2 MG/ML IJ SOLN
INTRAMUSCULAR | Status: AC
  Administered 2014-03-02: 2 mg via INTRAVENOUS
  Filled 2014-03-02: qty 1

## 2014-03-02 MED ORDER — SODIUM CHLORIDE 0.9 % IV SOLN
100.0000 mg | Freq: Two times a day (BID) | INTRAVENOUS | Status: DC
  Administered 2014-03-02 – 2014-03-04 (×5): 100 mg via INTRAVENOUS
  Filled 2014-03-02 (×9): qty 10

## 2014-03-02 MED ORDER — AZITHROMYCIN 500 MG PO TABS: 500.0000 mg | ORAL_TABLET | Freq: Once | ORAL | Status: DC

## 2014-03-02 MED ORDER — LEVETIRACETAM 250 MG PO TABS: 1250.0000 mg | ORAL_TABLET | Freq: Two times a day (BID) | ORAL | Status: DC

## 2014-03-02 MED ORDER — DEXTROSE 5 % IV SOLN
1.0000 g | Freq: Once | INTRAVENOUS | Status: AC
  Administered 2014-03-02: 1 g via INTRAVENOUS
  Filled 2014-03-02: qty 10

## 2014-03-02 MED ORDER — SODIUM CHLORIDE 0.9 % IV SOLN
2.0000 mg/h | INTRAVENOUS | Status: DC
  Filled 2014-03-02: qty 10

## 2014-03-02 MED ORDER — LORAZEPAM 2 MG/ML IJ SOLN
INTRAMUSCULAR | Status: AC
  Filled 2014-03-02: qty 1

## 2014-03-02 MED ORDER — PANTOPRAZOLE SODIUM 40 MG PO TBEC: 40.0000 mg | DELAYED_RELEASE_TABLET | Freq: Every day | ORAL | Status: DC

## 2014-03-02 MED ORDER — LORAZEPAM 2 MG/ML IJ SOLN
2.0000 mg | Freq: Once | INTRAMUSCULAR | Status: AC
  Administered 2014-03-02: 2 mg via INTRAVENOUS

## 2014-03-02 MED ORDER — LEVETIRACETAM 750 MG PO TABS
1500.0000 mg | ORAL_TABLET | Freq: Two times a day (BID) | ORAL | Status: DC
Start: 2014-03-02 — End: 2014-03-02
  Filled 2014-03-02: qty 2

## 2014-03-02 MED ORDER — LEVETIRACETAM IN NACL 1500 MG/100ML IV SOLN
1500.0000 mg | Freq: Two times a day (BID) | INTRAVENOUS | Status: DC
  Administered 2014-03-02 – 2014-03-04 (×4): 1500 mg via INTRAVENOUS
  Filled 2014-03-02 (×6): qty 100

## 2014-03-02 MED ORDER — LACOSAMIDE 200 MG/20ML IV SOLN
200.0000 mg | INTRAVENOUS | Status: AC
  Administered 2014-03-02: 200 mg via INTRAVENOUS
  Filled 2014-03-02: qty 20

## 2014-03-02 NOTE — Consult Note (Signed)
PULMONARY / CRITICAL CARE MEDICINE   Name: Shelly Le MRN: [id number] DOB: 03-16-1961    ADMISSION DATE:  02/26/2014 CONSULTATION DATE:  10/30  REFERRING MD :  10/30  CHIEF COMPLAINT:   Seizures   INITIAL PRESENTATION:  53 y.o.f w/ PMH  panhypopit s/p transphenoidal ~ 10 yrs ago and seizure d.o, presented with shortness of breath and seizure on 10/26 CXR w/ LLL infiltrate. . Was admitted to the medical ward w/ working dx of CAP and seizure.  Neuro followed for evaluation of seizure. She was ready for discharge as of 10/30, then had another prolonged episode of seizure activity. PCCM was asked to evaluate for possible airway support.   STUDIES:  CT head 10/26: negative   SIGNIFICANT EVENTS:    HISTORY OF PRESENT ILLNESS:   53 y.o.f w/ PMH  panhypopit s/p transphenoidal ~ 10 yrs ago and seizure d.o, presented with shortness of breath and seizure on 10/26 CXR w/ LLL infiltrate. . Was admitted to the medical ward w/ working dx of CAP and seizure.  Neuro followed for evaluation of seizure. She was ready for discharge as of 10/30, then had another prolonged episode of seizure activity. PCCM was asked to evaluate for possible airway support.   PAST MEDICAL HISTORY :   has a past medical history of Addison's disease; Diabetes insipidus; Pneumonia; Seizures; and Brain tumor.  has past surgical history that includes Tonsillectomy; Abdominal hysterectomy; and Brain surgery. Prior to Admission medications   Medication Sig Start Date End Date Taking? Authorizing Provider  alendronate (FOSAMAX) 70 MG tablet Take 70 mg by mouth once a week. Take with a full glass of water on an empty stomach.   Yes Historical Provider, MD  ALPRAZolam Duanne Moron) 1 MG tablet Take 1 mg by mouth 3 (three) times daily as needed for sleep (slep).    Yes Historical Provider, MD  azelastine (OPTIVAR) 0.05 % ophthalmic solution Place 1 drop into both eyes 2 (two) times daily.   Yes Historical Provider, MD   butalbital-acetaminophen-caffeine (FIORICET WITH CODEINE) 50-325-40-30 MG per capsule Take 1 capsule by mouth every 4 (four) hours as needed for headache or migraine.   Yes Historical Provider, MD  Calcium Carb-Cholecalciferol (CALCIUM-VITAMIN D3) 600-500 MG-UNIT CAPS Take 1 tablet by mouth 2 (two) times daily.   Yes Historical Provider, MD  CARBONYL IRON PO Take 1 tablet by mouth daily.   Yes Historical Provider, MD  desmopressin (DDAVP) 0.01 % nasal solution Place 10 mcg into the nose 2 (two) times daily.   Yes Historical Provider, MD  diclofenac sodium (VOLTAREN) 1 % GEL Apply 2 g topically daily as needed (for pain).   Yes Historical Provider, MD  dimenhyDRINATE (DRAMAMINE) 50 MG tablet Take 50 mg by mouth at bedtime as needed (for sleep).   Yes Historical Provider, MD  eszopiclone (LUNESTA) 1 MG TABS tablet Take 2 mg by mouth at bedtime. Take immediately before bedtime   Yes Historical Provider, MD  fentaNYL (DURAGESIC - DOSED MCG/HR) 75 MCG/HR Place 1 patch onto the skin every 3 (three) days.   Yes Historical Provider, MD  FIBER PO Take 2 tablets by mouth 2 (two) times daily.   Yes Historical Provider, MD  gabapentin (NEURONTIN) 300 MG capsule Take 300 mg by mouth 3 (three) times daily.   Yes Historical Provider, MD  HYDROcodone-acetaminophen (NORCO) 10-325 MG per tablet Take 1 tablet by mouth every 6 (six) hours as needed for moderate pain.   Yes Historical Provider, MD  levETIRAcetam (KEPPRA) 1000  MG tablet Take 1,000 mg by mouth 2 (two) times daily.   Yes Historical Provider, MD  levothyroxine (SYNTHROID, LEVOTHROID) 100 MCG tablet Take 100 mcg by mouth daily before breakfast.   Yes Historical Provider, MD  mirtazapine (REMERON) 15 MG tablet Take 15 mg by mouth at bedtime.   Yes Historical Provider, MD  olopatadine (PATANOL) 0.1 % ophthalmic solution Place 1 drop into both eyes 2 (two) times daily.   Yes Historical Provider, MD  ondansetron (ZOFRAN-ODT) 4 MG disintegrating tablet Take 4 mg  by mouth every 6 (six) hours as needed for nausea or vomiting.   Yes Historical Provider, MD  oxyCODONE-acetaminophen (PERCOCET/ROXICET) 5-325 MG per tablet Take 1 tablet by mouth every 6 (six) hours.   Yes Historical Provider, MD  oxyCODONE-acetaminophen (ROXICET) 5-325 MG/5ML solution Take 5 mLs by mouth every 4 (four) hours as needed for severe pain.   Yes Historical Provider, MD  polyethylene glycol (MIRALAX / GLYCOLAX) packet Take 17 g by mouth daily as needed for moderate constipation.   Yes Historical Provider, MD  potassium chloride (K-DUR) 10 MEQ tablet Take 10 mEq by mouth 2 (two) times daily.   Yes Historical Provider, MD  tiZANidine (ZANAFLEX) 4 MG tablet Take 8 mg by mouth every 8 (eight) hours as needed for muscle spasms.   Yes Historical Provider, MD  Vitamin D, Ergocalciferol, (DRISDOL) 50000 UNITS CAPS capsule Take 50,000 Units by mouth every 7 (seven) days.   Yes Historical Provider, MD  zolpidem (AMBIEN) 5 MG tablet Take 5 mg by mouth at bedtime as needed for sleep.   Yes Historical Provider, MD  azithromycin (ZITHROMAX) 500 MG tablet Take 1 tablet (500 mg total) by mouth once. 03/02/14   Charlott Rakes, MD  cefdinir (OMNICEF) 300 MG capsule Take 1 capsule (300 mg total) by mouth 2 (two) times daily. 03/02/14   Charlott Rakes, MD  hydrocortisone (CORTEF) 20 MG tablet Tonight, take 3 tablets. Please take 4 tablets in the morning, and 2 tablets in the afternoon until Sunday (11/1). Resume 2 tablets in the morning, 1 tablet in the afternoon afterwards. 03/02/14   Charlott Rakes, MD  levETIRAcetam (KEPPRA) 250 MG tablet Take 5 tablets (1,250 mg total) by mouth 2 (two) times daily. 03/02/14   Charlott Rakes, MD  pantoprazole (PROTONIX) 40 MG tablet Take 1 tablet (40 mg total) by mouth daily. 03/02/14   Charlott Rakes, MD  promethazine (PHENERGAN) 25 MG tablet Take 25 mg by mouth every 6 (six) hours as needed for nausea or vomiting (nausea & vomiting).    Historical Provider, MD   Allergies   Allergen Reactions  . Contrast Media [Iodinated Diagnostic Agents] Rash and Shortness Of Breath  . Fentanyl Other (See Comments)    Witnessed grand tonic clonic seizure in ED while wearing patch (02/26/14); h/o seizure disorder  . Morphine Hives and Shortness Of Breath  . Shellfish Allergy Anaphylaxis and Hives  . Latex   . Penicillins Nausea And Vomiting    GI Intolerance  . Metrizamide Rash    FAMILY HISTORY:  has no family status information on file.  SOCIAL HISTORY:  reports that she has never smoked. She has never used smokeless tobacco. She reports that she does not drink alcohol or use illicit drugs.  REVIEW OF SYSTEMS:  Unable   SUBJECTIVE: lethargic   VITAL SIGNS: Temp:  [98 F (36.7 C)-98.7 F (37.1 C)] 98 F (36.7 C) (10/30 0500) Pulse Rate:  [69-160] 145 (10/30 1441) Resp:  [16-18] 18 (10/30 0500) BP: (  86-148)/(57-108) 105/72 mmHg (10/30 1441) SpO2:  [100 %] 100 % (10/30 1006) FiO2 (%):  [21 %] 21 % (10/30 1006) Weight:  [77.021 kg (169 lb 12.8 oz)] 77.021 kg (169 lb 12.8 oz) (10/30 0500) HEMODYNAMICS:   VENTILATOR SETTINGS: Vent Mode:  [-]  FiO2 (%):  [21 %] 21 % INTAKE / OUTPUT:  Intake/Output Summary (Last 24 hours) at 03/02/14 1526 Last data filed at 03/02/14 1300  Gross per 24 hour  Intake   2100 ml  Output   1160 ml  Net    940 ml    PHYSICAL EXAMINATION: General:  53 year old female slow to respond. Having intermittent seizure activity  Neuro:  Difficult to arouse. Follows commands when she is awake. Oriented X 4. But then has recurrent seizure like activity  HEENT:  MMM, no JVD  Cardiovascular:  rrr Lungs:  Clear, no accessory muscle use  Abdomen:  Soft, non-tender  Musculoskeletal:  Intact  Skin:  Intact   LABS:  CBC  Recent Labs Lab 02/27/14 0453 02/28/14 0415 03/01/14 1340  WBC 7.7 7.2 7.5  HGB 9.3* 9.2* 10.9*  HCT 27.6* 28.5* 33.3*  PLT 291 167 170   Coag's No results found for this basename: APTT, INR,  in the last 168  hours BMET  Recent Labs Lab 02/28/14 0415 03/01/14 1340 03/02/14 0923  NA 140 138 141  K 3.5* 3.0* 3.2*  CL 106 101 104  CO2 22 22 17*  BUN 6 6 7   CREATININE 0.77 0.79 0.71  GLUCOSE 108* 117* 89   Electrolytes  Recent Labs Lab 02/26/14 0839  02/28/14 0415 03/01/14 1340 03/02/14 0923  CALCIUM  --   < > 7.9* 8.3* 8.5  MG 1.8  --   --   --   --   < > = values in this interval not displayed. Sepsis Markers No results found for this basename: LATICACIDVEN, PROCALCITON, O2SATVEN,  in the last 168 hours ABG No results found for this basename: PHART, PCO2ART, PO2ART,  in the last 168 hours Liver Enzymes  Recent Labs Lab 02/26/14 0839  AST 19  ALT 8  ALKPHOS 53  BILITOT <0.2*  ALBUMIN 3.3*   Cardiac Enzymes  Recent Labs Lab 02/23/14 1802 02/26/14 0641  TROPONINI <0.30 <0.30   Glucose  Recent Labs Lab 02/26/14 0540  GLUCAP 117*    Imaging No results found.   ASSESSMENT / PLAN:  PULMONARY OETT A: At risk for Acute respiratory failure P:   Cont pulse ox May require intubation if high doses of benzos required   CARDIOVASCULAR CVL A:  No acute  P:  Tele   RENAL Hypokalemia + anion gap acidosis  Plan: Replace lytes as needed. Got KDur earlier 10/30 Ck lactic acid IV hydration   GASTROINTESTINAL A:   No acute  P:   PPI for SUP  HEMATOLOGIC A:  Anemia w/out evidence of bleeding  P:  Trend CBC [place] heparin   NEURO: A:  Seizure vs pseudo-seizure  Could consider both. She was fully oriented after only a few  Minutes of her seizure in what should have been her post-ictal state.   P:   RASS goal: 0 EEG AEDs per neuro For now sedating gtts on hold   A: Infectious disease.  CAP P: Cont azithro day 3/7 F/u BC 10/28>>>  A: Endocrine  PanHypoPit P:   Cont her DDAVP, cortef and synthroid  Serial CBGs   Merton Border, MD ; [facility] service Mobile [phone].  After  5:30 PM or weekends, call [phone]  03/02/2014, 3:26  PM

## 2014-03-02 NOTE — Significant Event (Signed)
Rapid Response Event Note  Overview: Time Called: 1427 Arrival Time: 1429 Event Type: Neurologic  Initial Focused Assessment: Per Staff, when they entered room at 1420 patient was seizing, [employer]. (pt banging head on side rail)  Placed in a safe position and suction set up. 2mg  Ativan given per orders IVP.   MD notified of event.  Interventions: MD at bedside, patient not responding to sternal rub, pupils fixed and dilated.  Continued myoclonic activity in leg and face. 2nd dose of 2mg  Ativan given IV 200mg  Vimpat ordered and given IV. Patient seemed to relax, but remains unresponsive to sternal rub Neurology at bedside to assess patient.  Patient again with seizure activity. 3rd dose of 2mg  Ativan given IV Transferred to 3M10 plan intubation and versed gtt. Upon arrival to ICU patient following commands weakly and answering some questions. Current plan continue to monitor patient and EEG RN to call if assistance needed.  Event Summary: Name of Physician Notified: Dr Daryll Drown at 1425  Name of Consulting Physician Notified: Dr Towanda Octave,  Marni Griffon PA/CCM at 1445  Outcome: Transferred (Comment) (3M10)  Event End Time: [place]  Raliegh Ip

## 2014-03-02 NOTE — Progress Notes (Signed)
Overnight EEG started.  Dr Janann Colonel viewed initial tracing.

## 2014-03-02 NOTE — Progress Notes (Addendum)
  Date: 03/02/2014  Patient name: Shelly Le  Medical record number: [id number]  Date of birth: 1960-06-28   This patient has been seen and the plan of care was discussed with the house staff. Please see their note for complete details. I concur with their findings with the following additions/corrections:  Patient was expected to be discharged today, however, see below.   Sid Falcon, MD 03/02/2014, 8:13 PM  Update  I was called to bedside with my MS 3 medical student to evaluate Shelly Le who was reported to be having a seizure.    Per nursing report, she noticed an elevated HR into the 140s around 2:20pm.  She went in to the room to find the patient seizing, banging her head into the side rale.  Rapid response was called.  When we arrived, the patient has received 2mg  of ativan.  She was having what appeared to be a [place] tonic clonic seizure with abnormal movements of head and legs.  Her HR ranged from the 130s-150s.  She was given another push of ativan 2mg  by nursing without any improvement.  Loading dose of 200mg  of Vimpat was ordered and started.  She continued to have fine head tremor and no response to voice or painful stimuli.  After the second dose of ativan, she did begin coughing, however, she never decreased her PO sats.    PE Not alert, eyes closed, no response to verbal or painful stimuli Tachycardic, regular rate, no apparent murmur Some pausing in breathing, but taking deep breaths, maintained saturation in the high 90s Abdomen soft Ext no edema  Labs: ordered stat CMET and lactic acid  Neurology was re-consulted and decided to start her on a BZD drip and intubate in the ICU.  PCCM was called and patient transferred to the ICU.   Gilles Chiquito, MD
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
## 2014-03-02 NOTE — Progress Notes (Signed)
[facility] Progress Note Patient Name: Shelly Le DOB: 17-Sep-1960 MRN: [id number]   Date of Service  03/02/2014  HPI/Events of Note    eICU Interventions  Foot stick OK for lab draw     Intervention Category Minor Interventions: Routine modifications to care plan (e.g. PRN medications for pain, fever)  Tameaka Eichhorn 03/02/2014, 8:42 PM

## 2014-03-02 NOTE — Progress Notes (Signed)
Called by primary team for 30 minutes of generalized tonic clonic activity refractory to total of 6mg  ativan and a vimpat load. Transferred to ICU with plan for versed gtt and intubation. Mental status improved to minimally following commands and oriented x 3. Decision made to hold off on versed gtt.   Patient subsequently became less responsive. Dilated pupils, not responsive to noxious stimuli. Holding on further sedation pending EEG. Will continue to monitor.

## 2014-03-02 NOTE — Progress Notes (Signed)
eLink Physician-Brief Progress Note Patient Name: Shelly Le DOB: May 01, 1961 MRN: [id number]   Date of Service  03/02/2014  HPI/Events of Note  Status epilepticus No active seizure activity no camera check Getting eeg  eICU Interventions  Continue current management No intervention     Intervention Category Evaluation Type: New Patient Evaluation  MCQUAID, DOUGLAS 03/02/2014, 4:00 PM

## 2014-03-02 NOTE — Progress Notes (Signed)
I got an alert that patients heart rate was up to the 140s. I immediately went to check on her to find her having a siezure and her left side of her head was hitting the bed frame. I sat pt up and protected airway. Vital signs charted. Rapid response at bedside and Dr. Daryll Drown paged and came to bedside. Pt received ordered medication. Please see MAR. Pt transported to 3M10 for seizure that would not stop. Family update by the family. Pt transferred to new room at 1510 still having twitching of her eyelids and legs.

## 2014-03-02 NOTE — Progress Notes (Signed)
UR completed.  Fianna Snowball, RN BSN MHA CCM Trauma/Neuro ICU Case Manager [phone]  

## 2014-03-03 DIAGNOSIS — D352 Benign neoplasm of pituitary gland: Secondary | ICD-10-CM

## 2014-03-03 DIAGNOSIS — R569 Unspecified convulsions: Secondary | ICD-10-CM

## 2014-03-03 DIAGNOSIS — J189 Pneumonia, unspecified organism: Secondary | ICD-10-CM

## 2014-03-03 MED ORDER — ACETAMINOPHEN 325 MG PO TABS
650.0000 mg | ORAL_TABLET | Freq: Four times a day (QID) | ORAL | Status: DC | PRN
Start: 2014-03-03 — End: 2014-03-07
  Filled 2014-03-03: qty 2

## 2014-03-03 MED ORDER — SODIUM CHLORIDE 0.9 % IV SOLN
INTRAVENOUS | Status: DC
  Administered 2014-03-04 (×2): via INTRAVENOUS

## 2014-03-03 NOTE — Progress Notes (Signed)
Patient trans in from 3MW very lethargic from medication given to her. Skin assessments very intact. Lungs clear and vital signs stable. Made comfortable in bed and call bell placed within reach.

## 2014-03-03 NOTE — Progress Notes (Signed)
PULMONARY / CRITICAL CARE MEDICINE   Name: Shelly Le MRN: [id number] DOB: [birth year]    ADMISSION DATE:  02/26/2014 CONSULTATION DATE:  10/30  REFERRING MD :  10/30  CHIEF COMPLAINT:   Seizures   INITIAL PRESENTATION:  53 y.o.f w/ PMH  panhypopit s/p transphenoidal ~ 10 yrs ago and seizure d.o, presented with shortness of breath and seizure on 10/26 CXR w/ LLL infiltrate. . Was admitted to the medical ward w/ working dx of CAP and seizure.  Neuro followed for evaluation of seizure. She was ready for discharge as of 10/30, then had another prolonged episode of seizure activity. PCCM was asked to evaluate for possible airway support.   STUDIES:  CT head 10/26: negative  EEG 10/30 >. No active seizures   SIGNIFICANT EVENTS:  SUBJECTIVE: lethargic   VITAL SIGNS: Temp:  [98.4 F (36.9 C)-98.8 F (37.1 C)] 98.4 F (36.9 C) (10/31 0400) Pulse Rate:  [81-160] 98 (10/31 0900) Resp:  [15-32] 17 (10/31 0900) BP: (86-148)/(57-108) 126/79 mmHg (10/31 0900) SpO2:  [96 %-100 %] 99 % (10/31 0900) HEMODYNAMICS:   VENTILATOR SETTINGS:   INTAKE / OUTPUT:  Intake/Output Summary (Last 24 hours) at 03/03/14 1034 Last data filed at 03/03/14 0900  Gross per 24 hour  Intake   2145 ml  Output   3010 ml  Net   -865 ml    PHYSICAL EXAMINATION: General:  53 year old female slow to respond. Having intermittent seizure activity  Neuro:  Difficult to arouse. Follows commands when she is awake. Oriented X 4. But then has recurrent seizure like activity  HEENT:  MMM, no JVD  Cardiovascular:  rrr Lungs:  Clear, no accessory muscle use  Abdomen:  Soft, non-tender  Musculoskeletal:  Intact  Skin:  Intact   LABS:  CBC  Recent Labs Lab 02/28/14 0415 03/01/14 1340 03/02/14 2125  WBC 7.2 7.5 6.7  HGB 9.2* 10.9* 12.1  HCT 28.5* 33.3* 35.7*  PLT 167 170 177   Coag's No results found for this basename: APTT, INR,  in the last 168 hours BMET  Recent Labs Lab 03/01/14 1340  03/02/14 0923 03/02/14 2120  NA 138 141 140  K 3.0* 3.2* 4.2  CL 101 104 103  CO2 22 17* 25  BUN 6 7 6   CREATININE 0.79 0.71 0.80  GLUCOSE 117* 89 93   Electrolytes  Recent Labs Lab 02/26/14 0839  03/01/14 1340 03/02/14 0923 03/02/14 2120  CALCIUM  --   < > 8.3* 8.5 9.0  MG 1.8  --   --   --   --   < > = values in this interval not displayed. Sepsis Markers  Recent Labs Lab 03/02/14 2040  LATICACIDVEN 1.3   ABG No results found for this basename: PHART, PCO2ART, PO2ART,  in the last 168 hours Liver Enzymes  Recent Labs Lab 02/26/14 0839 03/02/14 2120  AST 19 82*  ALT 8 62*  ALKPHOS 53 105  BILITOT <0.2* <0.2*  ALBUMIN 3.3* 3.3*   Cardiac Enzymes  Recent Labs Lab 02/26/14 0641  TROPONINI <0.30   Glucose  Recent Labs Lab 02/26/14 0540  GLUCAP 117*    Imaging No results found.   ASSESSMENT / PLAN:  PULMONARY A: At risk for Acute respiratory failure P:   Cont pulse ox pulmonary hygiene  CARDIOVASCULAR A:  No acute  P:  Tele   RENAL Hypokalemia, resolved + anion gap acidosis  Plan: Replace lytes as needed.  IV hydration   GASTROINTESTINAL A:  No acute  P:   PPI for SUP  HEMATOLOGIC A:  Anemia w/out evidence of bleeding  P:  Trend CBC Burns heparin   NEURO: A:  Seizure vs pseudo-seizure  Could consider both. She was fully oriented after only a few  Minutes of her seizure in what should have been her post-ictal state. Neuro feels more consistent with true seizure, now resolved after increase in control meds  P:   RASS goal: 0 AEDs per neuro > Keppra increased, also on neurontin, vimpat, prn ativan, xanax  A: Infectious disease.  CAP P: Cont azithro day 4/7 F/u BC 10/28>>>  A: Endocrine  PanHypoPit P:   Cont her DDAVP, cortef and synthroid  Serial CBGs  GLOBAL: OK to transfer to floor bed, back to IMTS   Baltazar Apo, MD, PhD 03/03/2014, 10:40 AM [facility] [phone] or if no  answer [phone]

## 2014-03-03 NOTE — Progress Notes (Signed)
Subjective: Patient resting comfortably. Notes headache, otherwise no acute concerns. No further seizure activity. EEG read as unremarkable.   Objective: Current vital signs: BP 120/68  Pulse 86  Temp(Src) 98.4 F (36.9 C) (Oral)  Resp 18  Ht 5\' 5"  (1.651 m)  Wt 77.021 kg (169 lb 12.8 oz)  BMI 28.26 kg/m2  SpO2 98% Vital signs in last 24 hours: Temp:  [98.4 F (36.9 C)-98.8 F (37.1 C)] 98.4 F (36.9 C) (10/31 0400) Pulse Rate:  [81-160] 86 (10/31 0700) Resp:  [15-32] 18 (10/31 0700) BP: (86-148)/(57-108) 120/68 mmHg (10/31 0700) SpO2:  [97 %-100 %] 98 % (10/31 0700) FiO2 (%):  [21 %] 21 % (10/30 1006)  Intake/Output from previous day: 10/30 0701 - 10/31 0700 In: 2135 [P.O.:600; I.V.:1375; IV Piggyback:160] Out: 3010 [Urine:3010] Intake/Output this shift:   Nutritional status: General  Neurologic Exam: Mental Status:  Lethargic but easily arousable, oriented x 2, thought content appropriate. Speech fluent without evidence of aphasia. Cranial Nerves:  II: visual fields grossly normal, pupils equal, round, reactive to light and accommodation  III,IV, VI: ptosis not present, extra-ocular motions intact bilaterally  V,VII: smile symmetric, facial light touch sensation normal bilaterally  Motor:  Right : Upper extremity Left: Upper extremity  5/5 deltoid 5/5 deltoid  5/5 biceps 5/5 biceps  5/5 triceps 5/5 triceps  5/5 hand grip 5/5 hand grip  Lower extremity Lower extremity  5/5 hip flexor 4/5 hip flexor *  5/5 quadricep 4/5 quadriceps *  5/5 hamstrings 5/5 hamstrings  5/5 plantar flexion 5/5 plantar flexion  5/5 plantar extension 5/5 plantar extension  *LLE weakness likely pain related  Tone and bulk:normal tone throughout; no atrophy noted  Sensory: light touch intact throughout, bilaterally  Gait: deferred due to multiple leads   Lab Results: Basic Metabolic Panel:  Recent Labs Lab 02/26/14 0839 02/27/14 0453 02/28/14 0415 03/01/14 1340 03/02/14 0923  03/02/14 2120  NA  --  142 140 138 141 140  K  --  3.7 3.5* 3.0* 3.2* 4.2  CL  --  109 106 101 104 103  CO2  --  24 22 22  17* 25  GLUCOSE  --  111* 108* 117* 89 93  BUN  --  6 6 6 7 6   CREATININE  --  0.84 0.77 0.79 0.71 0.80  CALCIUM  --  8.0* 7.9* 8.3* 8.5 9.0  MG 1.8  --   --   --   --   --     Liver Function Tests:  Recent Labs Lab 02/26/14 0839 03/02/14 2120  AST 19 82*  ALT 8 62*  ALKPHOS 53 105  BILITOT <0.2* <0.2*  PROT 5.6* 6.7  ALBUMIN 3.3* 3.3*   No results found for this basename: LIPASE, AMYLASE,  in the last 168 hours No results found for this basename: AMMONIA,  in the last 168 hours  CBC:  Recent Labs Lab 02/26/14 0641 02/27/14 0453 02/28/14 0415 03/01/14 1340 03/02/14 2125  WBC 4.8 7.7 7.2 7.5 6.7  NEUTROABS 2.2  --   --   --   --   HGB 10.1* 9.3* 9.2* 10.9* 12.1  HCT 30.1* 27.6* 28.5* 33.3* 35.7*  MCV 80.1 79.5 81.4 80.6 79.3  PLT 183 291 167 170 177    Cardiac Enzymes:  Recent Labs Lab 02/26/14 0641 02/26/14 0839  CKTOTAL  --  374*  TROPONINI <0.30  --     Lipid Panel: No results found for this basename: CHOL, TRIG, HDL, CHOLHDL, VLDL, LDLCALC,  in the last 168 hours  CBG:  Recent Labs Lab 02/26/14 0540  GLUCAP 117*    Microbiology: Results for orders placed during the hospital encounter of 02/26/14  RAPID STREP SCREEN     Status: None   Collection Time    02/27/14  2:55 PM      Result Value Ref Range Status   Streptococcus, Group A Screen (Direct) NEGATIVE  NEGATIVE Final   Comment: (NOTE)     A Rapid Antigen test may result negative if the antigen level in the     sample is below the detection level of this test. The FDA has not     cleared this test as a stand-alone test therefore the rapid antigen     negative result has reflexed to a Group A Strep culture.  CULTURE, GROUP A STREP     Status: None   Collection Time    02/27/14  2:55 PM      Result Value Ref Range Status   Specimen Description THROAT   Final    Special Requests NONE   Final   Culture     Final   Value: No Beta Hemolytic Streptococci Isolated     Performed at [facility]   Report Status 03/01/2014 FINAL   Final  CULTURE, BLOOD (ROUTINE X 2)     Status: None   Collection Time    02/28/14  8:10 PM      Result Value Ref Range Status   Specimen Description BLOOD RIGHT HAND   Final   Special Requests BOTTLES DRAWN AEROBIC ONLY 4CC   Final   Culture  Setup Time     Final   Value: 03/01/2014 00:46     Performed at [facility]   Culture     Final   Value:        BLOOD CULTURE RECEIVED NO GROWTH TO DATE CULTURE WILL BE HELD FOR 5 DAYS BEFORE ISSUING A FINAL NEGATIVE REPORT     Performed at [facility]   Report Status PENDING   Incomplete  CULTURE, BLOOD (ROUTINE X 2)     Status: None   Collection Time    02/28/14  8:40 PM      Result Value Ref Range Status   Specimen Description BLOOD RIGHT WRIST   Final   Special Requests BOTTLES DRAWN AEROBIC ONLY 4CC   Final   Culture  Setup Time     Final   Value: 03/01/2014 00:46     Performed at [facility]   Culture     Final   Value:        BLOOD CULTURE RECEIVED NO GROWTH TO DATE CULTURE WILL BE HELD FOR 5 DAYS BEFORE ISSUING A FINAL NEGATIVE REPORT     Performed at [facility]   Report Status PENDING   Incomplete  MRSA PCR SCREENING     Status: None   Collection Time    03/02/14  5:43 PM      Result Value Ref Range Status   MRSA by PCR NEGATIVE  NEGATIVE Final   Comment:            The GeneXpert MRSA Assay (FDA     approved for NASAL specimens     only), is one component of a     comprehensive MRSA colonization     surveillance program. It is not     intended to diagnose MRSA     infection nor to  guide or     monitor treatment for     MRSA infections.    Coagulation Studies: No results found for this basename: LABPROT, INR,  in the last 72 hours  Imaging: No results found.  Medications: I have reviewed the patient's current  medications.  Assessment/Plan:   Assessment: Pt is a 53 yo woman with past medical history of pituitary adenoma s/p resection 10 yrs ago with resulting seizure disorder, Addison's disease, hypothyroidism, diabetes insipidus, and s/p hysterectomy who presented with two GTC seizures on admission now seizure-free on increased dosage of keppra. Neurology re-consulted after a 30 minute seizure episode on 10/30. Unclear etiology of breakthrough event. Currently stable with normal overnight EEG.   Plan:  Seizure disorder secondary to pituitary adenoma resection -   -continue vimpat 100mg  BID and keppra 1500mg  BID -seizure precautions. Ativan prn for breakthrough seizures -LTM EEG discontinued -stable for transfer out of ICU from a neurological standpoint -will continue to follow     Jim Like, DO [facility] [phone]  If 7pm- 7am, please page neurology on call as listed in [place].

## 2014-03-03 NOTE — Progress Notes (Signed)
Wasted 50 cc versed drip in sink with Viona Gilmore RN

## 2014-03-03 NOTE — Procedures (Addendum)
This was a continuous 15 hour video EEG monitoring  The monitoring ran from March 02, 2014 to March 03, 2014.  Referring Physician: Dr. Jim Like, MD  Reading Physician: Dr. Jaquita Folds, MD   History of present illness: This is a 53 year old female who presents for evaluation of seizures.  Medications: Unknown.  Technique: This was a continuous 15 hour video EEG monitoring which was performed during a state of wakefulness and sleep.  Neither photic stimulation nor hyperventilation were performed as activating procedures.  Description: As the tracing opens, the dominant posterior waking rhythm consists of a frequency of 10-11 Hz alpha with a voltage range of 7-35 V.  As recording continued, there was an inadvertent pushbutton event which occurred at 7:22:39 pm and was associated with no electrographic correlate.  As recording continued, the patient became drowsy and did enter stage II sleep as demonstrated by the presence of symmetric vertex waves as well as sleep spindles.  As recording continued the patient was in stage I or stage II sleep most the time.  There were no asymmetries nor epileptiform discharges noted.  In addition there were  no electrographic seizures noted during the monitoring.  Electrodiagnostic Diagnosis: This was a normal awake and asleep 15 hour continuous video EEG monitoring.  There was 1 pushbutton event noted during the monitoring which was inadvertent.     Shelly Le, M.D.

## 2014-03-04 MED ORDER — PANTOPRAZOLE SODIUM 40 MG PO TBEC
40.0000 mg | DELAYED_RELEASE_TABLET | Freq: Two times a day (BID) | ORAL | Status: DC
  Administered 2014-03-04 – 2014-03-06 (×5): 40 mg via ORAL
  Filled 2014-03-04 (×5): qty 1

## 2014-03-04 MED ORDER — LACOSAMIDE 50 MG PO TABS
100.0000 mg | ORAL_TABLET | Freq: Two times a day (BID) | ORAL | Status: DC
  Administered 2014-03-04 – 2014-03-06 (×4): 100 mg via ORAL
  Filled 2014-03-04 (×4): qty 2

## 2014-03-04 MED ORDER — LEVETIRACETAM 750 MG PO TABS
1500.0000 mg | ORAL_TABLET | Freq: Two times a day (BID) | ORAL | Status: DC
  Administered 2014-03-04 – 2014-03-06 (×4): 1500 mg via ORAL
  Filled 2014-03-04 (×4): qty 2

## 2014-03-04 NOTE — Progress Notes (Signed)
Discussed transfer of primary SVC to IM TS for am 11/2 0700.  PCCM will sign off at that time.

## 2014-03-04 NOTE — Progress Notes (Signed)
PULMONARY / CRITICAL CARE MEDICINE   Name: Shelly Le MRN: [id number] DOB: 1960-08-01    ADMISSION DATE:  02/26/2014 CONSULTATION DATE:  10/30  REFERRING MD :  10/30  CHIEF COMPLAINT:   Seizures   INITIAL PRESENTATION:  53 y.o.f w/ PMH  panhypopit s/p transphenoidal ~ 10 yrs ago and seizure d.o, presented with shortness of breath and seizure on 10/26 CXR w/ LLL infiltrate. . Was admitted to the medical ward w/ working dx of CAP and seizure.  Neuro followed for evaluation of seizure. She was ready for discharge as of 10/30, then had another prolonged episode of seizure activity. PCCM was asked to evaluate for possible airway support.   STUDIES:  CT head 10/26: negative  EEG 10/30 > No active seizures   SIGNIFICANT EVENTS:  SUBJECTIVE:  Nauseated, congested cough   VITAL SIGNS: Temp:  [98.2 F (36.8 C)-99.1 F (37.3 C)] 99.1 F (37.3 C) (11/01 0700) Pulse Rate:  [88-103] 95 (11/01 0700) Resp:  [16-23] 18 (11/01 0700) BP: (96-145)/(51-82) 133/69 mmHg (11/01 0700) SpO2:  [95 %-100 %] 99 % (11/01 0700) HEMODYNAMICS:   VENTILATOR SETTINGS:   INTAKE / OUTPUT:  Intake/Output Summary (Last 24 hours) at 03/04/14 0902 Last data filed at 03/04/14 0700  Gross per 24 hour  Intake 673.75 ml  Output    925 ml  Net -251.25 ml    PHYSICAL EXAMINATION: General:  nad  Neuro:  Alert/ conversant with multiple details of care / doctors' names etc HEENT:  MMM, no JVD  Cardiovascular:  rrr Lungs:   A few insp and exp rhonchi Abdomen:  Soft, non-tender  Musculoskeletal:  Intact  Skin:  Intact   LABS:  CBC  Recent Labs Lab 02/28/14 0415 03/01/14 1340 03/02/14 2125  WBC 7.2 7.5 6.7  HGB 9.2* 10.9* 12.1  HCT 28.5* 33.3* 35.7*  PLT 167 170 177   Coag's No results for input(s): APTT, INR in the last 168 hours. BMET  Recent Labs Lab 03/01/14 1340 03/02/14 0923 03/02/14 2120  NA 138 141 140  K 3.0* 3.2* 4.2  CL 101 104 103  CO2 22 17* 25  BUN 6 7 6    CREATININE 0.79 0.71 0.80  GLUCOSE 117* 89 93   Electrolytes  Recent Labs Lab 02/26/14 0839  03/01/14 1340 03/02/14 0923 03/02/14 2120  CALCIUM  --   < > 8.3* 8.5 9.0  MG 1.8  --   --   --   --   < > = values in this interval not displayed. Sepsis Markers  Recent Labs Lab 03/02/14 2040  LATICACIDVEN 1.3   ABG No results for input(s): PHART, PCO2ART, PO2ART in the last 168 hours. Liver Enzymes  Recent Labs Lab 02/26/14 0839 03/02/14 2120  AST 19 82*  ALT 8 62*  ALKPHOS 53 105  BILITOT <0.2* <0.2*  ALBUMIN 3.3* 3.3*   Cardiac Enzymes  Recent Labs Lab 02/26/14 0641  TROPONINI <0.30   Glucose  Recent Labs Lab 02/26/14 0540  GLUCAP 117*    Imaging No results found.   ASSESSMENT / PLAN:  PULMONARY A: At risk for Acute respiratory failure P:   Cont pulse ox pulmonary hygiene Added flutter 11/1   CARDIOVASCULAR A:  No acute  P:  Tele   RENAL Hypokalemia, resolved + anion gap acidosis. Resolved 10/30  Plan: Replace lytes as needed.  Continue IV  hydration   GASTROINTESTINAL A:   No acute  P:   PPI for SUP  HEMATOLOGIC A:  Anemia  w/out evidence of bleeding  P:  Trend CBC [place] heparin   NEURO: A:  Seizure vs pseudo-seizure  Could consider both. She was fully oriented after only a few  Minutes of her seizure in what should have been her post-ictal state. Neuro feels more consistent with true seizure, now resolved after increase in control meds  P:     per neuro > Keppra increased, also on neurontin, vimpat, prn ativan, xanax  A: Infectious disease.  CAP LLL - viral screens 10/28 nl   BC x2  10/28>>> - rocephin 10/28 - 10/30 - Zmax 10/28 >>> P: Cont azithromax x 7 days planned (11/4 stop date)   A: Endocrine  PanHypoPit P:   Cont her DDAVP, cortef and synthroid  Serial CBGs  GLOBAL: OK to transferred to floor 10/31 To IMTS 11/1    Christinia Gully, MD Pulmonary and [place]  [phone] After 5:30 PM or weekends, call [phone]

## 2014-03-04 NOTE — Progress Notes (Signed)
Subjective: Patient resting comfortably. Transferred out of ICU yesterday  Objective: Current vital signs: BP 133/69 mmHg  Pulse 95  Temp(Src) 99.1 F (37.3 C) (Oral)  Resp 18  Ht 5\' 5"  (1.651 m)  Wt 77.021 kg (169 lb 12.8 oz)  BMI 28.26 kg/m2  SpO2 99% Vital signs in last 24 hours: Temp:  [98.2 F (36.8 C)-99.2 F (37.3 C)] 99.1 F (37.3 C) (11/01 0700) Pulse Rate:  [88-103] 95 (11/01 0700) Resp:  [16-23] 18 (11/01 0700) BP: (96-145)/(51-82) 133/69 mmHg (11/01 0700) SpO2:  [95 %-100 %] 99 % (11/01 0700)  Intake/Output from previous day: 10/31 0701 - 11/01 0700 In: 1023.8 [P.O.:220; I.V.:668.8; IV Piggyback:135] Out: 1225 [Urine:1225] Intake/Output this shift:   Nutritional status: Diet regular  Neurologic Exam: Mental Status:  Lethargic but easily arousable, oriented x 3, thought content appropriate. Speech fluent without evidence of aphasia. Cranial Nerves:  II: visual fields grossly normal, pupils equal, round, reactive to light and accommodation  III,IV, VI: ptosis not present, extra-ocular motions intact bilaterally  V,VII: smile symmetric, facial light touch sensation normal bilaterally  Motor:  Right : Upper extremity Left: Upper extremity  5/5 deltoid 5/5 deltoid  5/5 biceps 5/5 biceps  5/5 triceps 5/5 triceps  5/5 hand grip 5/5 hand grip  Lower extremity Lower extremity  5/5 hip flexor 5-/5 hip flexor  5/5 quadricep 5-/5 quadriceps  5/5 hamstrings 5/5 hamstrings  5/5 plantar flexion 5/5 plantar flexion  5/5 plantar extension 5/5 plantar extension   Tone and bulk:normal tone throughout; no atrophy noted  Sensory: light touch intact throughout, bilaterally  Gait: deferred due to multiple leads   Lab Results: Basic Metabolic Panel:  Recent Labs Lab 02/26/14 0839 02/27/14 0453 02/28/14 0415 03/01/14 1340 03/02/14 0923 03/02/14 2120  NA  --  142 140 138 141 140  K  --  3.7 3.5* 3.0* 3.2* 4.2  CL  --  109 106 101 104 103  CO2  --  24 22 22   17* 25  GLUCOSE  --  111* 108* 117* 89 93  BUN  --  6 6 6 7 6   CREATININE  --  0.84 0.77 0.79 0.71 0.80  CALCIUM  --  8.0* 7.9* 8.3* 8.5 9.0  MG 1.8  --   --   --   --   --     Liver Function Tests:  Recent Labs Lab 02/26/14 0839 03/02/14 2120  AST 19 82*  ALT 8 62*  ALKPHOS 53 105  BILITOT <0.2* <0.2*  PROT 5.6* 6.7  ALBUMIN 3.3* 3.3*   No results for input(s): LIPASE, AMYLASE in the last 168 hours. No results for input(s): AMMONIA in the last 168 hours.  CBC:  Recent Labs Lab 02/26/14 0641 02/27/14 0453 02/28/14 0415 03/01/14 1340 03/02/14 2125  WBC 4.8 7.7 7.2 7.5 6.7  NEUTROABS 2.2  --   --   --   --   HGB 10.1* 9.3* 9.2* 10.9* 12.1  HCT 30.1* 27.6* 28.5* 33.3* 35.7*  MCV 80.1 79.5 81.4 80.6 79.3  PLT 183 291 167 170 177    Cardiac Enzymes:  Recent Labs Lab 02/26/14 0641 02/26/14 0839  CKTOTAL  --  374*  TROPONINI <0.30  --     Lipid Panel: No results for input(s): CHOL, TRIG, HDL, CHOLHDL, VLDL, LDLCALC in the last 168 hours.  CBG:  Recent Labs Lab 02/26/14 0540  GLUCAP 117*    Microbiology: Results for orders placed or performed during the hospital encounter of 02/26/14  Rapid strep screen     Status: None   Collection Time: 02/27/14  2:55 PM  Result Value Ref Range Status   Streptococcus, Group A Screen (Direct) NEGATIVE NEGATIVE Final    Comment: (NOTE) A Rapid Antigen test may result negative if the antigen level in the sample is below the detection level of this test. The FDA has not cleared this test as a stand-alone test therefore the rapid antigen negative result has reflexed to a Group A Strep culture.  Culture, Group A Strep     Status: None   Collection Time: 02/27/14  2:55 PM  Result Value Ref Range Status   Specimen Description THROAT  Final   Special Requests NONE  Final   Culture   Final    No Beta Hemolytic Streptococci Isolated Performed at [facility]   Report Status 03/01/2014 FINAL  Final  Culture,  blood (routine x 2) Call MD if unable to obtain prior to antibiotics being given     Status: None (Preliminary result)   Collection Time: 02/28/14  8:10 PM  Result Value Ref Range Status   Specimen Description BLOOD RIGHT HAND  Final   Special Requests BOTTLES DRAWN AEROBIC ONLY 4CC  Final   Culture  Setup Time   Final    03/01/2014 00:46 Performed at [facility]   Culture   Final           BLOOD CULTURE RECEIVED NO GROWTH TO DATE CULTURE WILL BE HELD FOR 5 DAYS BEFORE ISSUING A FINAL NEGATIVE REPORT Performed at [facility]   Report Status PENDING  Incomplete  Culture, blood (routine x 2) Call MD if unable to obtain prior to antibiotics being given     Status: None (Preliminary result)   Collection Time: 02/28/14  8:40 PM  Result Value Ref Range Status   Specimen Description BLOOD RIGHT WRIST  Final   Special Requests BOTTLES DRAWN AEROBIC ONLY 4CC  Final   Culture  Setup Time   Final    03/01/2014 00:46 Performed at [facility]   Culture   Final           BLOOD CULTURE RECEIVED NO GROWTH TO DATE CULTURE WILL BE HELD FOR 5 DAYS BEFORE ISSUING A FINAL NEGATIVE REPORT Performed at [facility]   Report Status PENDING  Incomplete  MRSA PCR Screening     Status: None   Collection Time: 03/02/14  5:43 PM  Result Value Ref Range Status   MRSA by PCR NEGATIVE NEGATIVE Final    Comment:        The GeneXpert MRSA Assay (FDA approved for NASAL specimens only), is one component of a comprehensive MRSA colonization surveillance program. It is not intended to diagnose MRSA infection nor to guide or monitor treatment for MRSA infections.    Coagulation Studies: No results for input(s): LABPROT, INR in the last 72 hours.  Imaging: No results found.  Medications: I have reviewed the patient's current medications.  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 Neurology re-consulted after a 30 minute seizure episode on 10/30. Unclear etiology of breakthrough event. Currently stable with normal overnight EEG.   Plan:  Seizure disorder secondary to pituitary adenoma resection -   -  continue vimpat 100mg  BID and keppra 1500mg  BID -seizure precautions. Ativan prn for breakthrough seizures -medical workup and treatment per primary team -no further inpatient neurological workup at this time   Jim Like, DO [facility] [phone]  If 7pm- 7am, please page neurology on call as listed in [place].

## 2014-03-05 ENCOUNTER — Inpatient Hospital Stay (HOSPITAL_COMMUNITY): Payer: Medicare Other

## 2014-03-05 DIAGNOSIS — J189 Pneumonia, unspecified organism: Secondary | ICD-10-CM | POA: Insufficient documentation

## 2014-03-05 LAB — CBC
HCT: 27.5 % — ABNORMAL LOW (ref 36.0–46.0)
HEMOGLOBIN: 9.3 g/dL — AB (ref 12.0–15.0)
MCH: 26.8 pg (ref 26.0–34.0)
MCHC: 33.8 g/dL (ref 30.0–36.0)
MCV: 79.3 fL (ref 78.0–100.0)
PLATELETS: 167 10*3/uL (ref 150–400)
RBC: 3.47 MIL/uL — ABNORMAL LOW (ref 3.87–5.11)
RDW: 13.9 % (ref 11.5–15.5)
WBC: 7.9 10*3/uL (ref 4.0–10.5)

## 2014-03-05 LAB — BASIC METABOLIC PANEL
ANION GAP: 14 (ref 5–15)
BUN: 5 mg/dL — AB (ref 6–23)
CHLORIDE: 99 meq/L (ref 96–112)
CO2: 23 mEq/L (ref 19–32)
Calcium: 8.6 mg/dL (ref 8.4–10.5)
Creatinine, Ser: 0.71 mg/dL (ref 0.50–1.10)
Glucose, Bld: 88 mg/dL (ref 70–99)
POTASSIUM: 3.3 meq/L — AB (ref 3.7–5.3)
Sodium: 136 mEq/L — ABNORMAL LOW (ref 137–147)

## 2014-03-05 LAB — TROPONIN I

## 2014-03-05 MED ORDER — POTASSIUM CHLORIDE CRYS ER 20 MEQ PO TBCR
40.0000 meq | EXTENDED_RELEASE_TABLET | Freq: Once | ORAL | Status: AC
  Administered 2014-03-05: 40 meq via ORAL
  Filled 2014-03-05: qty 2

## 2014-03-05 MED ORDER — PROMETHAZINE HCL 25 MG PO TABS
12.5000 mg | ORAL_TABLET | Freq: Four times a day (QID) | ORAL | Status: DC | PRN
  Administered 2014-03-05 – 2014-03-06 (×3): 12.5 mg via ORAL
  Filled 2014-03-05 (×3): qty 1

## 2014-03-05 MED ORDER — HYDROCODONE-ACETAMINOPHEN 10-325 MG PO TABS
1.0000 | ORAL_TABLET | Freq: Four times a day (QID) | ORAL | Status: DC | PRN
  Administered 2014-03-05 – 2014-03-06 (×4): 1 via ORAL
  Filled 2014-03-05 (×4): qty 1

## 2014-03-05 MED ORDER — DEXTROSE 5 % IV SOLN
1.0000 g | INTRAVENOUS | Status: DC
  Administered 2014-03-05 – 2014-03-06 (×2): 1 g via INTRAVENOUS
  Filled 2014-03-05 (×4): qty 10

## 2014-03-05 NOTE — Progress Notes (Signed)
Pt c/o 9/10 pain in bilat LEs and in her L chest r/t her lung infiltrate.  MD notified. 10-325 Norco ordered and given, as well as STAT EKG and troponins x3.    EKG done and in chart.  MD aware that pt may refuse lab draws.
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
## 2014-03-05 NOTE — Progress Notes (Signed)
Night Float Progress Note  Ms. Rizo had c/o continued chest pain that she says has been present since admission.  She says the pain is constant, 8/10.  She is also nauseous and has been coughing due to her pneumonia.  She feels Zofran does not work well for her.  Her home medication, Norco was restarted today but she says she did not receive it.  She is not dyspneic on 2L via [place].  EKG done earlier this evening show NSR and is similar to prior.   Recent vitals:  T 98.6, RR 18, HR 68, BP 125/76, SpO2 100% on 2L  General:  Awake and alert, in NAD, watching tv Heart:  RRR, no m/r/g, minimal TTP to palpation of chest Lungs:  CTA anteriorly Abdomen:  +BS, soft, NT LE:  SCDs in place, no edema Neuro:  AAO x 3, responding appropriately, moving voluntarily Recent EKG:  NSR, 88bpm, normal intervals, no ischemic findings, similar to prior  53 yo with PMH of Addison's disease, DI, adenoma s/p pituitary resection admitted after seizure.  Has c/o of chest pain that has been present since admission.   Troponin were trended and negative at admission, she has no EKG changes, and the duration and atypical nature of the pain make ACS less likely.  V/Q scan was negative for PE.  Her pain is likely a combination of MSK pain due to cough/pneumonia, fibromyalgia and anxiety.   - Trend troponin x 3 - Continue Norco 10-325mg  q6h prn (EPIC MAR says this was given at 5:30PM but she says it was not; regardless, it is not too soon to give a dose) - Stop Zofran and start phenergan 12.5mg  q6h prn - Continue Robitussin prn (she has not requested) - Continue abx for pneumonia - Continue Xanax 1mg  TID prn (this was recently increased from qHS prn to TID prn) - She was advised to let RN know if her pain was not responding to [place] and we can re-assess at that time, otherwise day team will re-evaluate in the morning.   Duwaine Maxin, DO IMTS, PGY2

## 2014-03-05 NOTE — Evaluation (Signed)
Physical Therapy Evaluation Patient Details Name: Shelly Le MRN: [id number] DOB: 07/21/1960 Today's Date: 03/05/2014   History of Present Illness  Patient is a 53 yo female admitted 02/26/14 with SOB, seizures, and Lt thigh pain.  Patient with CAP.  PMH:  pituitary adenoma s/p resection 10 years ago, seizures, Addison's disease, anxiety, DM.  Clinical Impression  Patient presents with problems listed below.  Will benefit from acute PT to maximize independence prior to discharge.  Patient required max encouragement to participate with PT.  Participated minimally.  Continued to state receiving "poor care", "they are pushing me".  Will continue to encourage patient to participate with mobility/PT.    Follow Up Recommendations Home health PT;Supervision for mobility/OOB (Patient reports she has been having PT in [facility] (?))    Equipment Recommendations  None recommended by PT    Recommendations for Other Services       Precautions / Restrictions Precautions Precautions: Fall Restrictions Weight Bearing Restrictions: No      Mobility  Bed Mobility Overal bed mobility: Needs Assistance Bed Mobility: Supine to Sit;Sit to Supine     Supine to sit: Min guard Sit to supine: Min guard   General bed mobility comments: Patient able to move to EOB (required max encouragement to participate).  Min guard assist for safety only.  Good sitting balance once upright.  Patient sat EOB x 6 minutes.  Transfers                 General transfer comment: Patient declined to stand.  "If I fall it would be your fault"  "They just want to push me"  Ambulation/Gait                Stairs            Wheelchair Mobility    Modified Rankin (Stroke Patients Only)       Balance   Sitting-balance support: No upper extremity supported;Feet supported Sitting balance-Leahy Scale: Good Sitting balance - Comments: No physical assist needed.  Min guard for safety only.                                      Pertinent Vitals/Pain Pain Assessment: Faces Faces Pain Scale: Hurts little more Pain Location: "I have pneumonia.  My back hurts" Pain Descriptors / Indicators: Aching Pain Intervention(s): Limited activity within patient's tolerance;Monitored during session    [place] expects to be discharged to:: Private residence Living Arrangements: Alone Available Help at Discharge: Family;Friend(s);Available PRN/intermittently           Home Equipment: Walker - 4 wheels;Cane - single point Additional Comments: Patient attempting to initiate argument with PT.  Did not get answers to all questions.    Prior Function Level of Independence: Independent with assistive device(s)               Hand Dominance        Extremity/Trunk Assessment   Upper Extremity Assessment: Overall WFL for tasks assessed           Lower Extremity Assessment: Generalized weakness      Cervical / Trunk Assessment: Normal  Communication   Communication: No difficulties  Cognition Arousal/Alertness: Awake/alert Behavior During Therapy: Agitated Overall Cognitive Status: Within Functional Limits for tasks assessed  General Comments      Exercises        Assessment/Plan    PT Assessment Patient needs continued PT services  PT Diagnosis Difficulty walking;Generalized weakness;Acute pain   PT Problem List Decreased strength;Decreased activity tolerance;Decreased mobility;Cardiopulmonary status limiting activity;Pain  PT Treatment Interventions DME instruction;Gait training;Functional mobility training;Therapeutic activities;Therapeutic exercise;Patient/family education   PT Goals (Current goals can be found in the Care Plan section) Acute Rehab PT Goals Patient Stated Goal: None stated PT Goal Formulation: With patient Time For Goal Achievement: 03/12/14 Potential to Achieve Goals: Good     Frequency Min 3X/week   Barriers to discharge Decreased caregiver support Patient lives alone per chart.  Unsure of support.    Co-evaluation               End of Session Equipment Utilized During Treatment: Oxygen Activity Tolerance: Patient limited by fatigue;Patient limited by pain Patient left: in bed;with call bell/phone within reach;with bed alarm set;with family/visitor present Nurse Communication: Mobility status (Unwilling to participate)         Time: 5625-6389 PT Time Calculation (min): 17 min   Charges:   PT Evaluation $Initial PT Evaluation Tier I: 1 Procedure PT Treatments $Therapeutic Activity: 8-22 mins   PT G Codes:          Despina Pole 03/05/2014, 11:02 AM Carita Pian. Sanjuana Kava, [place] Pager [phone]

## 2014-03-05 NOTE — Progress Notes (Signed)
UR completed 
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
## 2014-03-06 DIAGNOSIS — E890 Postprocedural hypothyroidism: Secondary | ICD-10-CM | POA: Insufficient documentation

## 2014-03-06 DIAGNOSIS — R1310 Dysphagia, unspecified: Secondary | ICD-10-CM

## 2014-03-06 DIAGNOSIS — R079 Chest pain, unspecified: Secondary | ICD-10-CM | POA: Insufficient documentation

## 2014-03-06 DIAGNOSIS — G8929 Other chronic pain: Secondary | ICD-10-CM

## 2014-03-06 DIAGNOSIS — G40901 Epilepsy, unspecified, not intractable, with status epilepticus: Secondary | ICD-10-CM | POA: Insufficient documentation

## 2014-03-06 DIAGNOSIS — M79661 Pain in right lower leg: Secondary | ICD-10-CM

## 2014-03-06 DIAGNOSIS — D638 Anemia in other chronic diseases classified elsewhere: Secondary | ICD-10-CM

## 2014-03-06 DIAGNOSIS — E230 Hypopituitarism: Secondary | ICD-10-CM | POA: Insufficient documentation

## 2014-03-06 DIAGNOSIS — R0602 Shortness of breath: Secondary | ICD-10-CM | POA: Insufficient documentation

## 2014-03-06 LAB — BASIC METABOLIC PANEL
Anion gap: 15 (ref 5–15)
BUN: 5 mg/dL — ABNORMAL LOW (ref 6–23)
CALCIUM: 9 mg/dL (ref 8.4–10.5)
CO2: 21 mEq/L (ref 19–32)
Chloride: 97 mEq/L (ref 96–112)
Creatinine, Ser: 0.76 mg/dL (ref 0.50–1.10)
GLUCOSE: 94 mg/dL (ref 70–99)
Potassium: 4.2 mEq/L (ref 3.7–5.3)
SODIUM: 133 meq/L — AB (ref 137–147)

## 2014-03-06 LAB — TROPONIN I: Troponin I: <0.3 ng/mL (ref <0.30)

## 2014-03-06 LAB — LEGIONELLA ANTIGEN, URINE

## 2014-03-06 MED ORDER — LEVETIRACETAM 750 MG PO TABS: 1500.0000 mg | ORAL_TABLET | Freq: Two times a day (BID) | ORAL | Status: DC

## 2014-03-06 MED ORDER — HYDROCORTISONE 20 MG PO TABS: ORAL_TABLET | ORAL | Status: DC

## 2014-03-06 MED ORDER — HYDROMORPHONE HCL 1 MG/ML IJ SOLN
0.5000 mg | Freq: Once | INTRAMUSCULAR | Status: AC
  Administered 2014-03-06: 0.5 mg via INTRAVENOUS
  Filled 2014-03-06: qty 1

## 2014-03-06 MED ORDER — CEFDINIR 300 MG PO CAPS: 300.0000 mg | ORAL_CAPSULE | Freq: Two times a day (BID) | ORAL | Status: DC

## 2014-03-06 MED ORDER — PANTOPRAZOLE SODIUM 40 MG PO TBEC: 40.0000 mg | DELAYED_RELEASE_TABLET | Freq: Two times a day (BID) | ORAL | Status: DC

## 2014-03-06 MED ORDER — LACOSAMIDE 100 MG PO TABS: 100.0000 mg | ORAL_TABLET | Freq: Two times a day (BID) | ORAL | Status: DC
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
## 2014-03-06 NOTE — Progress Notes (Signed)
Patient ID: Shelly Le, female   DOB: 12-27-1960, 53 y.o.   MRN: [id number] Medicine attending discharge note: Clinical summary: Complicated 53 year old woman admitted on 02/26/2014  with a number of complaints including recurrent seizure, intermittent dyspnea, and right thigh pain where she felt she was bitten by an insect. She was mildly post ictal on presentation and then had a witnessed seizure while under evaluation in the emergency department. She was treated with lorazepam and Keppra. Her situation initially stabilized and seizures initially stopped.She had another generalized seizure on October 30 refractory to a total of 6 mg of lorazepam and a loading dose of Vimpat. She was transferred to the intensive care unit for closer monitoring .EEG was unremarkable. Condition again stabilized and she was transferred back to the general medical ward. Etiology of her seizure disorder is presumed related to previous resection of a pituitary adenoma about 10 years ago. She is now pan-hypopituitary and on thyroid and steroid hormone replacement. She has diabetes insipidus and receives DDAVP by daily nasal instillation. Following the second seizure event, she had no subsequent seizures during this admission.  She had multiple complaints related to atypical migratory abdominal and chest pain and intermittent dyspnea with a choking sensation. There was no evidence that the pain was cardiac or pulmonary in origin. Nuclear medicine scan of the chest ruled out pulmonary emboli. Multiple cardiograms and troponin levels were normal.  She developed a early, vague, left lower lobe pulmonary infiltrate on chest radiograph. She was put on antibiotics to cover the possibility that she aspirated during a prolonged seizure episode .She never had a fever; no elevation of her white count.she requested supplemental oxygen but oxygen saturation levels never fell below 97% even on room air.  She is discharged in stable  condition but is still having atypical, migratory,  pain without a good explanation.  Impression: #1. Recurrent seizures in a woman with known seizure disorder now on Vimpat plus Keppra #2. Atypical noncardiac chest pain #3. 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 Diabetes insipidus secondary to #4. #6. chronic anxiety and I suspect component of depression related to her chronic illness #7. Microcytic anemia #8. Possible insect bite right thigh with localized pain and erythema which were self resolving.

## 2014-03-06 NOTE — Progress Notes (Signed)
Physical Therapy Treatment Patient Details Name: Shelly Le MRN: [id number] DOB: 1960/10/21 Today's Date: 03/06/2014    History of Present Illness Patient is a 53 yo female admitted 02/26/14 with SOB, seizures, and Lt thigh pain.  Patient with CAP.  PMH:  pituitary adenoma s/p resection 10 years ago, seizures, Addison's disease, anxiety, DM.    PT Comments    Patient progressing slowly with mobility. Very vocal about disappointment with care provided yesterday by all providers in hospital throughout session. Tolerated short distance ambulation which was self limited due to 10/10 pain in bil feet. Pt conversing throughout ambulation calmly despite 10/10 pain. Agreed to sit in chair to eat lunch. Education provided on importance of sitting upright to improve PNA. Will continue to follow and progress as tolerated.   Follow Up Recommendations  Home health PT;Supervision for mobility/OOB     Equipment Recommendations  None recommended by PT    Recommendations for Other Services       Precautions / Restrictions Precautions Precautions: Fall Restrictions Weight Bearing Restrictions: No    Mobility  Bed Mobility Overal bed mobility: Needs Assistance Bed Mobility: Supine to Sit     Supine to sit: Min guard     General bed mobility comments: Min guard for safety. No physical assist required.  Transfers Overall transfer level: Needs assistance Equipment used: Rolling walker (2 wheeled) Transfers: Sit to/from Stand Sit to Stand: Min guard         General transfer comment: Min guard to stand for balance/safety. Stood from toilet with grab bar for assist.   Ambulation/Gait Ambulation/Gait assistance: Min guard Ambulation Distance (Feet): 15 Feet (+ 55') Assistive device: Rolling walker (2 wheeled) Gait Pattern/deviations: Decreased stride length;Step-through pattern;Trunk flexed Gait velocity: slow   General Gait Details: Slow, steady gait with mild balance deficits.  Pt declined ambulating in hallway due to pain in bil feet.  "This walker doesn't have brakes."   Stairs            Wheelchair Mobility    Modified Rankin (Stroke Patients Only)       Balance Overall balance assessment: Needs assistance Sitting-balance support: Feet supported;No upper extremity supported Sitting balance-Leahy Scale: Good Sitting balance - Comments: Asked therapist to donn socks sitting EOB. Pt able to reach down and remove SCDs without difficulty.    Standing balance support: During functional activity;Bilateral upper extremity supported Standing balance-Leahy Scale: Poor Standing balance comment: Requires BUE support on RW for safety/balance.                     Cognition Arousal/Alertness: Awake/alert Behavior During Therapy: Agitated (Agitated about poor care provided at hospital per patient. ) Overall Cognitive Status: Within Functional Limits for tasks assessed                      Exercises      General Comments        Pertinent Vitals/Pain Pain Assessment: 0-10 Pain Score: 10-Worst pain ever Pain Location: feet Pain Descriptors / Indicators: Aching;Sore;Sharp Pain Intervention(s): Limited activity within patient's tolerance;Monitored during session;Repositioned;Patient requesting pain meds-RN notified    Home Living                      Prior Function            PT Goals (current goals can now be found in the care plan section) Progress towards PT goals: Progressing toward goals    Frequency  Min 3X/week    PT Plan Current plan remains appropriate    Co-evaluation             End of Session Equipment Utilized During Treatment: Gait belt Activity Tolerance: Patient limited by pain Patient left: in chair;with call bell/phone within reach;with chair alarm set;with nursing/sitter in room     Time: 1151-1227 PT Time Calculation (min): 36 min  Charges:  $Gait Training: 8-22 mins $Therapeutic  Activity: 8-22 mins                    G CodesCandy Sledge A 2014/03/26, 1:01 PM  Candy Sledge, [place], DPT [phone]

## 2014-03-06 NOTE — Discharge Instructions (Addendum)
Thank you for trusting [place] with your medical care!  You were hospitalized for pneumonia and seizures. You were treated with antibiotics; please continue taking cefdinir (Omnicef) twice a day until you are out of medication. You do NOT need to take azithromycin any longer.  For your seizures, your dose of Keppra was increased to 1500mg , and you were started on Vimpat. Please continue taking these medications to prevent future seizures.  For your cough, please take Mucinex over the counter.  All of your tests and labwork were reassuring that you did not have another disease/condition that may have caused you to come into the hospital. Please follow-up with your regular doctor should your symptoms worsen.  Pneumonia Pneumonia is an infection of the lungs.  CAUSES Pneumonia may be caused by bacteria or a virus. Usually, these infections are caused by breathing infectious particles into the lungs (respiratory tract). SIGNS AND SYMPTOMS   Cough.  Fever.  Chest pain.  Increased rate of breathing.  Wheezing.  Mucus production. DIAGNOSIS  If you have the common symptoms of pneumonia, your health care provider will typically confirm the diagnosis with a chest X-ray. The X-ray will show an abnormality in the lung (pulmonary infiltrate) if you have pneumonia. Other tests of your blood, urine, or sputum may be done to find the specific cause of your pneumonia. Your health care provider may also do tests (blood gases or pulse oximetry) to see how well your lungs are working. TREATMENT  Some forms of pneumonia may be spread to other people when you cough or sneeze. You may be asked to wear a mask before and during your exam. Pneumonia that is caused by bacteria is treated with antibiotic medicine. Pneumonia that is caused by the influenza virus may be treated with an antiviral medicine. Most other viral infections must run their course. These infections will not respond to antibiotics.  HOME CARE  INSTRUCTIONS   Cough suppressants may be used if you are losing too much rest. However, coughing protects you by clearing your lungs. You should avoid using cough suppressants if you can.  Your health care provider may have prescribed medicine if he or she thinks your pneumonia is caused by bacteria or influenza. Finish your medicine even if you start to feel better.  Your health care provider may also prescribe an expectorant. This loosens the mucus to be coughed up.  Take medicines only as directed by your health care provider.  Do not smoke. Smoking is a common cause of bronchitis and can contribute to pneumonia. If you are a smoker and continue to smoke, your cough may last several weeks after your pneumonia has cleared.  A cold steam vaporizer or humidifier in your room or home may help loosen mucus.  Coughing is often worse at night. Sleeping in a semi-upright position in a recliner or using a couple pillows under your head will help with this.  Get rest as you feel it is needed. Your body will usually let you know when you need to rest. PREVENTION A pneumococcal shot (vaccine) is available to prevent a common bacterial cause of pneumonia. This is usually suggested for:  People over 30 years old.  Patients on chemotherapy.  People with chronic lung problems, such as bronchitis or emphysema.  People with immune system problems. If you are over 65 or have a high risk condition, you may receive the pneumococcal vaccine if you have not received it before. In some countries, a routine influenza vaccine is also recommended.  This vaccine can help prevent some cases of pneumonia.You may be offered the influenza vaccine as part of your care. If you smoke, it is time to quit. You may receive instructions on how to stop smoking. Your health care provider can provide medicines and counseling to help you quit. SEEK MEDICAL CARE IF: You have a fever. SEEK IMMEDIATE MEDICAL CARE IF:   Your  illness becomes worse. This is especially true if you are elderly or weakened from any other disease.  You cannot control your cough with suppressants and are losing sleep.  You begin coughing up blood.  You develop pain which is getting worse or is uncontrolled with medicines.  Any of the symptoms which initially brought you in for treatment are getting worse rather than better.  You develop shortness of breath or chest pain. MAKE SURE YOU:   Understand these instructions.  Will watch your condition.  Will get help right away if you are not doing well or get worse. Document Released: 04/20/2005 Document Revised: 09/04/2013 Document Reviewed: 07/10/2010 [facility] Patient Information 2015 [place], [place]. This information is not intended to replace advice given to you by your health care provider. Make sure you discuss any questions you have with your health care provider.  Seizure, Adult A seizure is abnormal electrical activity in the brain. Seizures usually last from 30 seconds to 2 minutes. There are various types of seizures. Before a seizure, you may have a warning sensation (aura) that a seizure is about to occur. An aura may include the following symptoms:   Fear or anxiety.  Nausea.  Feeling like the room is spinning (vertigo).  Vision changes, such as seeing flashing lights or spots. Common symptoms during a seizure include:  A change in attention or behavior (altered mental status).  Convulsions with rhythmic jerking movements.  Drooling.  Rapid eye movements.  Grunting.  Loss of bladder and bowel control.  Bitter taste in the mouth.  Tongue biting. After a seizure, you may feel confused and sleepy. You may also have an injury resulting from convulsions during the seizure. HOME CARE INSTRUCTIONS   If you are given medicines, take them exactly as prescribed by your health care provider.  Keep all follow-up appointments as directed by your health care  provider.  Do not swim or drive or engage in risky activity during which a seizure could cause further injury to you or others until your health care provider says it is OK.  Get adequate rest.  Teach friends and family what to do if you have a seizure. They should:  Lay you on the ground to prevent a fall.  Put a cushion under your head.  Loosen any tight clothing around your neck.  Turn you on your side. If vomiting occurs, this helps keep your airway clear.  Stay with you until you recover.  Know whether or not you need emergency care. SEEK IMMEDIATE MEDICAL CARE IF:  The seizure lasts longer than 5 minutes.  The seizure is severe or you do not wake up immediately after the seizure.  You have an altered mental status after the seizure.  You are having more frequent or worsening seizures. Someone should drive you to the emergency department or call local emergency services (911 in [place] MAKE SURE YOU:  Understand these instructions.  Will watch your condition.  Will get help right away if you are not doing well or get worse. Document Released: 04/17/2000 Document Revised: 02/08/2013 Document Reviewed: 11/30/2012 [employer] Patient Information 2015  [name]. This information is not intended to replace advice given to you by your health care provider. Make sure you discuss any questions you have with your health care provider.

## 2014-03-06 NOTE — Progress Notes (Signed)
Discharge instructions were reviewed with patient, prescriptions given. She states she understands d/c instructions and medications. IV taken out, she is now waiting on her son to pick her up. Patient states he will be here later tonight.

## 2014-03-06 NOTE — Progress Notes (Signed)
Pt has orders to d/c home. RN informed pt that she had orders for d/c and that CM would come and speak with her about home health. Pt informed RN that her son had to go to [place] to "take care of some of her personal business" and that he would not be able to come pick her up to late this afternoon. RN told pt that was ok as long as he came today to pick her up. Pt stated she understood.

## 2014-03-06 NOTE — Progress Notes (Addendum)
I spoke with RN sometime between 6-7pm today, and she wasn't sure if the patient had contacted her son. I thus called the son, and after a 2-3 rings, it went to voicemail.   I left a message explaining to him what we - medical team, RN, and CM- had explained to his mother earlier today that she is clinically stable and may go home. Should she decide to stay overnight, she would have to pay for the costs herself. I reiterated to him that I wanted to him to hear the message directly since he was so dedicated and involved in his mother's care, and it would make sure we are all on the same page as I have stated in my conversations with him before.

## 2014-03-07 ENCOUNTER — Encounter (HOSPITAL_COMMUNITY): Payer: Self-pay | Admitting: Emergency Medicine

## 2014-03-07 ENCOUNTER — Inpatient Hospital Stay (HOSPITAL_COMMUNITY)
Admission: EM | Admit: 2014-03-07 | Discharge: 2014-03-13 | DRG: 882 | Disposition: A | Discharge status: Home / Self Care | Payer: Medicare Other | Attending: Oncology | Admitting: Oncology

## 2014-03-07 ENCOUNTER — Inpatient Hospital Stay (HOSPITAL_COMMUNITY): Payer: Medicare Other

## 2014-03-07 DIAGNOSIS — N281 Cyst of kidney, acquired: Secondary | ICD-10-CM

## 2014-03-07 DIAGNOSIS — R109 Unspecified abdominal pain: Secondary | ICD-10-CM

## 2014-03-07 DIAGNOSIS — D638 Anemia in other chronic diseases classified elsewhere: Secondary | ICD-10-CM | POA: Diagnosis present

## 2014-03-07 DIAGNOSIS — F329 Major depressive disorder, single episode, unspecified: Secondary | ICD-10-CM | POA: Diagnosis present

## 2014-03-07 DIAGNOSIS — Z79899 Other long term (current) drug therapy: Secondary | ICD-10-CM | POA: Diagnosis not present

## 2014-03-07 DIAGNOSIS — D352 Benign neoplasm of pituitary gland: Secondary | ICD-10-CM | POA: Diagnosis present

## 2014-03-07 DIAGNOSIS — Z91041 Radiographic dye allergy status: Secondary | ICD-10-CM

## 2014-03-07 DIAGNOSIS — N289 Disorder of kidney and ureter, unspecified: Secondary | ICD-10-CM

## 2014-03-07 DIAGNOSIS — F418 Other specified anxiety disorders: Secondary | ICD-10-CM | POA: Diagnosis present

## 2014-03-07 DIAGNOSIS — F4542 Pain disorder with related psychological factors: Secondary | ICD-10-CM | POA: Diagnosis present

## 2014-03-07 DIAGNOSIS — Z885 Allergy status to narcotic agent status: Secondary | ICD-10-CM

## 2014-03-07 DIAGNOSIS — Z90710 Acquired absence of both cervix and uterus: Secondary | ICD-10-CM

## 2014-03-07 DIAGNOSIS — Z7952 Long term (current) use of systemic steroids: Secondary | ICD-10-CM

## 2014-03-07 DIAGNOSIS — F4329 Adjustment disorder with other symptoms: Secondary | ICD-10-CM | POA: Diagnosis present

## 2014-03-07 DIAGNOSIS — J189 Pneumonia, unspecified organism: Secondary | ICD-10-CM | POA: Diagnosis present

## 2014-03-07 DIAGNOSIS — E232 Diabetes insipidus: Secondary | ICD-10-CM | POA: Diagnosis present

## 2014-03-07 DIAGNOSIS — Z880 Allergy status to penicillin: Secondary | ICD-10-CM

## 2014-03-07 DIAGNOSIS — R32 Unspecified urinary incontinence: Secondary | ICD-10-CM | POA: Diagnosis present

## 2014-03-07 DIAGNOSIS — F419 Anxiety disorder, unspecified: Secondary | ICD-10-CM | POA: Diagnosis present

## 2014-03-07 DIAGNOSIS — F4323 Adjustment disorder with mixed anxiety and depressed mood: Secondary | ICD-10-CM | POA: Diagnosis not present

## 2014-03-07 DIAGNOSIS — D72829 Elevated white blood cell count, unspecified: Secondary | ICD-10-CM | POA: Diagnosis present

## 2014-03-07 DIAGNOSIS — Z79891 Long term (current) use of opiate analgesic: Secondary | ICD-10-CM | POA: Diagnosis not present

## 2014-03-07 DIAGNOSIS — Z91410 Personal history of adult physical and sexual abuse: Secondary | ICD-10-CM | POA: Diagnosis not present

## 2014-03-07 DIAGNOSIS — G40909 Epilepsy, unspecified, not intractable, without status epilepticus: Secondary | ICD-10-CM | POA: Diagnosis present

## 2014-03-07 DIAGNOSIS — F4310 Post-traumatic stress disorder, unspecified: Secondary | ICD-10-CM | POA: Diagnosis present

## 2014-03-07 DIAGNOSIS — R609 Edema, unspecified: Secondary | ICD-10-CM | POA: Diagnosis present

## 2014-03-07 DIAGNOSIS — Z79890 Hormone replacement therapy (postmenopausal): Secondary | ICD-10-CM

## 2014-03-07 DIAGNOSIS — R112 Nausea with vomiting, unspecified: Secondary | ICD-10-CM | POA: Diagnosis present

## 2014-03-07 DIAGNOSIS — E271 Primary adrenocortical insufficiency: Secondary | ICD-10-CM | POA: Diagnosis present

## 2014-03-07 DIAGNOSIS — G894 Chronic pain syndrome: Secondary | ICD-10-CM | POA: Diagnosis present

## 2014-03-07 DIAGNOSIS — Z792 Long term (current) use of antibiotics: Secondary | ICD-10-CM

## 2014-03-07 DIAGNOSIS — E230 Hypopituitarism: Secondary | ICD-10-CM | POA: Diagnosis present

## 2014-03-07 DIAGNOSIS — R569 Unspecified convulsions: Secondary | ICD-10-CM

## 2014-03-07 DIAGNOSIS — E039 Hypothyroidism, unspecified: Secondary | ICD-10-CM | POA: Diagnosis present

## 2014-03-07 DIAGNOSIS — R748 Abnormal levels of other serum enzymes: Secondary | ICD-10-CM | POA: Diagnosis present

## 2014-03-07 DIAGNOSIS — R1013 Epigastric pain: Secondary | ICD-10-CM | POA: Diagnosis present

## 2014-03-07 LAB — URINALYSIS, ROUTINE W REFLEX MICROSCOPIC
BILIRUBIN URINE: NEGATIVE
Glucose, UA: NEGATIVE mg/dL
KETONES UR: NEGATIVE mg/dL
NITRITE: NEGATIVE
PH: 8 (ref 5.0–8.0)
PROTEIN: NEGATIVE mg/dL
Specific Gravity, Urine: 1.007 (ref 1.005–1.030)
UROBILINOGEN UA: 0.2 mg/dL (ref 0.0–1.0)

## 2014-03-07 LAB — BASIC METABOLIC PANEL
Anion gap: 17 — ABNORMAL HIGH (ref 5–15)
BUN: 5 mg/dL — ABNORMAL LOW (ref 6–23)
CALCIUM: 10.1 mg/dL (ref 8.4–10.5)
CO2: 21 meq/L (ref 19–32)
CREATININE: 0.78 mg/dL (ref 0.50–1.10)
Chloride: 102 mEq/L (ref 96–112)
GFR calc Af Amer: >90 mL/min (ref >90)
GLUCOSE: 86 mg/dL (ref 70–99)
Potassium: 3.6 mEq/L — ABNORMAL LOW (ref 3.7–5.3)
Sodium: 140 mEq/L (ref 137–147)

## 2014-03-07 LAB — CBC WITH DIFFERENTIAL/PLATELET
BASOS ABS: 0 10*3/uL (ref 0.0–0.1)
BASOS PCT: 0 % (ref 0–1)
Eosinophils Absolute: 0 10*3/uL (ref 0.0–0.7)
Eosinophils Relative: 0 % (ref 0–5)
HEMATOCRIT: 39.6 % (ref 36.0–46.0)
Hemoglobin: 13.6 g/dL (ref 12.0–15.0)
Lymphocytes Relative: 27 % (ref 12–46)
Lymphs Abs: 2.2 10*3/uL (ref 0.7–4.0)
MCH: 26.9 pg (ref 26.0–34.0)
MCHC: 34.3 g/dL (ref 30.0–36.0)
MCV: 78.3 fL (ref 78.0–100.0)
MONO ABS: 0.4 10*3/uL (ref 0.1–1.0)
Monocytes Relative: 5 % (ref 3–12)
Neutro Abs: 5.6 10*3/uL (ref 1.7–7.7)
Neutrophils Relative %: 68 % (ref 43–77)
PLATELETS: 262 10*3/uL (ref 150–400)
RBC: 5.06 MIL/uL (ref 3.87–5.11)
RDW: 13.5 % (ref 11.5–15.5)
WBC: 8.2 10*3/uL (ref 4.0–10.5)

## 2014-03-07 LAB — CULTURE, BLOOD (ROUTINE X 2)
Culture: NO GROWTH
Culture: NO GROWTH

## 2014-03-07 LAB — RAPID URINE DRUG SCREEN, HOSP PERFORMED
Amphetamines: NOT DETECTED
Barbiturates: NOT DETECTED
Benzodiazepines: POSITIVE — AB
COCAINE: NOT DETECTED
Opiates: NOT DETECTED
TETRAHYDROCANNABINOL: NOT DETECTED

## 2014-03-07 LAB — URINE MICROSCOPIC-ADD ON

## 2014-03-07 LAB — ETHANOL: Alcohol, Ethyl (B): <11 mg/dL (ref 0–11)

## 2014-03-07 LAB — LACTIC ACID, PLASMA: Lactic Acid, Venous: 2.6 mmol/L — ABNORMAL HIGH (ref 0.5–2.2)

## 2014-03-07 MED ORDER — TIZANIDINE HCL 4 MG PO TABS
8.0000 mg | ORAL_TABLET | Freq: Three times a day (TID) | ORAL | Status: DC | PRN
  Administered 2014-03-07 – 2014-03-13 (×9): 8 mg via ORAL
  Filled 2014-03-07 (×11): qty 2

## 2014-03-07 MED ORDER — SODIUM CHLORIDE 0.9 % IJ SOLN
3.0000 mL | Freq: Two times a day (BID) | INTRAMUSCULAR | Status: DC
  Administered 2014-03-08 – 2014-03-11 (×3): 3 mL via INTRAVENOUS

## 2014-03-07 MED ORDER — HYDROCORTISONE NA SUCCINATE PF 100 MG IJ SOLR
20.0000 mg | Freq: Every evening | INTRAMUSCULAR | Status: DC
  Administered 2014-03-08 – 2014-03-10 (×3): 20 mg via INTRAVENOUS
  Filled 2014-03-07 (×3): qty 2

## 2014-03-07 MED ORDER — ONDANSETRON HCL 4 MG/2ML IJ SOLN: 4.0000 mg | Freq: Once | INTRAMUSCULAR | Status: DC

## 2014-03-07 MED ORDER — DESMOPRESSIN ACE RHINAL TUBE 0.01 % NA SOLN: 10.0000 ug | Freq: Two times a day (BID) | NASAL | Status: DC

## 2014-03-07 MED ORDER — HYDROCODONE-ACETAMINOPHEN 10-325 MG PO TABS
1.0000 | ORAL_TABLET | Freq: Four times a day (QID) | ORAL | Status: DC | PRN
  Filled 2014-03-07: qty 1

## 2014-03-07 MED ORDER — DEXAMETHASONE SODIUM PHOSPHATE 10 MG/ML IJ SOLN
10.0000 mg | Freq: Once | INTRAMUSCULAR | Status: AC
  Administered 2014-03-07: 10 mg via INTRAVENOUS
  Filled 2014-03-07: qty 1

## 2014-03-07 MED ORDER — LEVOTHYROXINE SODIUM 100 MCG PO TABS
100.0000 ug | ORAL_TABLET | Freq: Every day | ORAL | Status: DC
  Filled 2014-03-07: qty 1

## 2014-03-07 MED ORDER — DEXTROSE 5 % IV SOLN
1.0000 g | INTRAVENOUS | Status: DC
  Administered 2014-03-08: 1 g via INTRAVENOUS
  Filled 2014-03-07: qty 10

## 2014-03-07 MED ORDER — HYDROCORTISONE NA SUCCINATE PF 100 MG IJ SOLR
40.0000 mg | Freq: Every morning | INTRAMUSCULAR | Status: DC
  Administered 2014-03-08 – 2014-03-10 (×3): 40 mg via INTRAVENOUS
  Filled 2014-03-07 (×3): qty 2

## 2014-03-07 MED ORDER — ONDANSETRON HCL 4 MG/2ML IJ SOLN
4.0000 mg | Freq: Once | INTRAMUSCULAR | Status: DC
  Filled 2014-03-07: qty 2

## 2014-03-07 MED ORDER — LORAZEPAM 2 MG/ML IJ SOLN: 1.0000 mg | INTRAMUSCULAR | Status: DC | PRN

## 2014-03-07 MED ORDER — LEVETIRACETAM IN NACL 1500 MG/100ML IV SOLN
1500.0000 mg | Freq: Two times a day (BID) | INTRAVENOUS | Status: DC
  Administered 2014-03-08 (×2): 1500 mg via INTRAVENOUS
  Filled 2014-03-07 (×4): qty 100

## 2014-03-07 MED ORDER — GABAPENTIN 300 MG PO CAPS
300.0000 mg | ORAL_CAPSULE | Freq: Three times a day (TID) | ORAL | Status: DC
  Administered 2014-03-10 – 2014-03-13 (×11): 300 mg via ORAL
  Filled 2014-03-07 (×13): qty 1

## 2014-03-07 MED ORDER — DESMOPRESSIN ACE REFRIGERATED 0.01 % NA SOLN
1.0000 [drp] | Freq: Two times a day (BID) | NASAL | Status: DC
  Administered 2014-03-09 – 2014-03-13 (×8): 1 [drp] via NASAL
  Filled 2014-03-07: qty 2.5

## 2014-03-07 MED ORDER — PROMETHAZINE HCL 25 MG/ML IJ SOLN
12.5000 mg | Freq: Once | INTRAMUSCULAR | Status: AC
  Administered 2014-03-07: 12.5 mg via INTRAVENOUS
  Filled 2014-03-07: qty 1

## 2014-03-07 MED ORDER — SODIUM CHLORIDE 0.9 % IV BOLUS (SEPSIS)
1000.0000 mL | Freq: Once | INTRAVENOUS | Status: AC
  Administered 2014-03-07: 1000 mL via INTRAVENOUS

## 2014-03-07 MED ORDER — PANTOPRAZOLE SODIUM 40 MG IV SOLR
40.0000 mg | Freq: Once | INTRAVENOUS | Status: AC
  Administered 2014-03-07: 40 mg via INTRAVENOUS
  Filled 2014-03-07: qty 40

## 2014-03-07 MED ORDER — HYDROMORPHONE HCL 1 MG/ML IJ SOLN
1.0000 mg | Freq: Once | INTRAMUSCULAR | Status: AC
  Administered 2014-03-07: 1 mg via INTRAVENOUS
  Filled 2014-03-07: qty 1

## 2014-03-07 MED ORDER — SODIUM CHLORIDE 0.9 % IV SOLN
INTRAVENOUS | Status: AC
  Administered 2014-03-07: 21:00:00 via INTRAVENOUS
  Administered 2014-03-08: 950 mL via INTRAVENOUS
  Administered 2014-03-08: 06:00:00 via INTRAVENOUS

## 2014-03-07 MED ORDER — ONDANSETRON HCL 4 MG/2ML IJ SOLN
4.0000 mg | Freq: Four times a day (QID) | INTRAMUSCULAR | Status: DC | PRN
  Administered 2014-03-07: 4 mg via INTRAVENOUS
  Filled 2014-03-07: qty 2

## 2014-03-07 MED ORDER — ONDANSETRON HCL 4 MG/2ML IJ SOLN
4.0000 mg | Freq: Once | INTRAMUSCULAR | Status: AC
  Administered 2014-03-07: 4 mg via INTRAVENOUS

## 2014-03-07 MED ORDER — SODIUM CHLORIDE 0.9 % IV SOLN
100.0000 mg | Freq: Two times a day (BID) | INTRAVENOUS | Status: DC
  Administered 2014-03-07 – 2014-03-10 (×7): 100 mg via INTRAVENOUS
  Filled 2014-03-07 (×17): qty 10

## 2014-03-07 MED ORDER — LORAZEPAM 2 MG/ML IJ SOLN
INTRAMUSCULAR | Status: AC
  Administered 2014-03-07: 2 mg
  Filled 2014-03-07: qty 1

## 2014-03-07 MED ORDER — POTASSIUM CHLORIDE 10 MEQ/100ML IV SOLN
10.0000 meq | INTRAVENOUS | Status: AC
  Administered 2014-03-07 (×2): 10 meq via INTRAVENOUS
  Filled 2014-03-07 (×2): qty 100

## 2014-03-07 MED ORDER — PROMETHAZINE HCL 25 MG/ML IJ SOLN
12.5000 mg | Freq: Four times a day (QID) | INTRAMUSCULAR | Status: DC | PRN
  Administered 2014-03-07 – 2014-03-11 (×13): 12.5 mg via INTRAVENOUS
  Filled 2014-03-07 (×14): qty 1

## 2014-03-07 MED ORDER — ENOXAPARIN SODIUM 40 MG/0.4ML ~~LOC~~ SOLN
40.0000 mg | SUBCUTANEOUS | Status: DC
  Administered 2014-03-07 – 2014-03-12 (×6): 40 mg via SUBCUTANEOUS
  Filled 2014-03-07 (×6): qty 0.4

## 2014-03-07 MED ORDER — ALPRAZOLAM 0.5 MG PO TABS
1.0000 mg | ORAL_TABLET | Freq: Three times a day (TID) | ORAL | Status: DC | PRN
  Administered 2014-03-07: 1 mg via ORAL
  Filled 2014-03-07: qty 2

## 2014-03-07 NOTE — ED Notes (Signed)
CT wants confirmation from ordering MD that we can do the scan without oral contrast. Have paged and waiting for return call. Needs CT before transporting to floor, report called. Floor aware of situation.

## 2014-03-07 NOTE — ED Notes (Signed)
Internal medicine physicians at bedside.

## 2014-03-07 NOTE — ED Provider Notes (Signed)
CSN: [id number]     Arrival date & time 03/07/14  1437 History   First MD Initiated Contact with Patient 03/07/14 1452     Chief Complaint  Patient presents with  . Seizures     (Consider location/radiation/quality/duration/timing/severity/associated sxs/prior Treatment) HPI....Marland Kitchenlevel V caveat for urgent need for intervention. Patient arrived via EMS after being found on the floor at home by her children. She was discharged from [facility] yesterday for pneumonia according to their history. She has been on long-term steroids for Addison's disease. When I first entered her room, she appeared to be having a tonic-clonic seizure.  Airway was maintained.  Past Medical History  Diagnosis Date  . Addison's disease   . Diabetes insipidus   . Pneumonia   . Seizures   . Brain tumor    Past Surgical History  Procedure Laterality Date  . Tonsillectomy    . Abdominal hysterectomy    . Brain surgery      crainotomy   History reviewed. No pertinent family history. History  Substance Use Topics  . Smoking status: Never Smoker   . Smokeless tobacco: Never Used  . Alcohol Use: No   OB History    No data available     Review of Systems  Unable to perform ROS: Acuity of condition      Allergies  Contrast media; Fentanyl; Morphine; Shellfish allergy; Latex; Penicillins; and Metrizamide  Home Medications   Prior to Admission medications   Medication Sig Start Date End Date Taking? Authorizing Provider  promethazine (PHENERGAN) 25 MG tablet Take 25 mg by mouth every 6 (six) hours as needed for nausea or vomiting (nausea & vomiting).   Yes Historical Provider, MD  alendronate (FOSAMAX) 70 MG tablet Take 70 mg by mouth once a week. Take with a full glass of water on an empty stomach.    Historical Provider, MD  ALPRAZolam Duanne Moron) 1 MG tablet Take 1 mg by mouth 3 (three) times daily as needed for sleep (slep).     Historical Provider, MD  azelastine (OPTIVAR) 0.05 % ophthalmic solution  Place 1 drop into both eyes 2 (two) times daily.    Historical Provider, MD  butalbital-acetaminophen-caffeine (FIORICET WITH CODEINE) 50-325-40-30 MG per capsule Take 1 capsule by mouth every 4 (four) hours as needed for headache or migraine.    Historical Provider, MD  Calcium Carb-Cholecalciferol (CALCIUM-VITAMIN D3) 600-500 MG-UNIT CAPS Take 1 tablet by mouth 2 (two) times daily.    Historical Provider, MD  CARBONYL IRON PO Take 1 tablet by mouth daily.    Historical Provider, MD  cefdinir (OMNICEF) 300 MG capsule Take 1 capsule (300 mg total) by mouth 2 (two) times daily. 03/06/14   Charlott Rakes, MD  desmopressin (DDAVP) 0.01 % nasal solution Place 10 mcg into the nose 2 (two) times daily.    Historical Provider, MD  diclofenac sodium (VOLTAREN) 1 % GEL Apply 2 g topically daily as needed (for pain).    Historical Provider, MD  dimenhyDRINATE (DRAMAMINE) 50 MG tablet Take 50 mg by mouth at bedtime as needed (for sleep).    Historical Provider, MD  eszopiclone (LUNESTA) 1 MG TABS tablet Take 2 mg by mouth at bedtime. Take immediately before bedtime    Historical Provider, MD  FIBER PO Take 2 tablets by mouth 2 (two) times daily.    Historical Provider, MD  gabapentin (NEURONTIN) 300 MG capsule Take 300 mg by mouth 3 (three) times daily.    Historical Provider, MD  HYDROcodone-acetaminophen [facility])  10-325 MG per tablet Take 1 tablet by mouth every 6 (six) hours as needed for moderate pain.    Historical Provider, MD  hydrocortisone (CORTEF) 20 MG tablet Take 2 tablets in the morning, 1 in the evening. 03/06/14   Charlott Rakes, MD  lacosamide 100 MG TABS Take 1 tablet (100 mg total) by mouth 2 (two) times daily. 03/06/14   Charlott Rakes, MD  levETIRAcetam (KEPPRA) 750 MG tablet Take 2 tablets (1,500 mg total) by mouth 2 (two) times daily. 03/06/14   Charlott Rakes, MD  levothyroxine (SYNTHROID, LEVOTHROID) 100 MCG tablet Take 100 mcg by mouth daily before breakfast.    Historical Provider, MD  mirtazapine  (REMERON) 15 MG tablet Take 15 mg by mouth at bedtime.    Historical Provider, MD  olopatadine (PATANOL) 0.1 % ophthalmic solution Place 1 drop into both eyes 2 (two) times daily.    Historical Provider, MD  ondansetron (ZOFRAN-ODT) 4 MG disintegrating tablet Take 4 mg by mouth every 6 (six) hours as needed for nausea or vomiting.    Historical Provider, MD  oxyCODONE-acetaminophen (PERCOCET/ROXICET) 5-325 MG per tablet Take 1 tablet by mouth every 6 (six) hours.    Historical Provider, MD  oxyCODONE-acetaminophen (ROXICET) 5-325 MG/5ML solution Take 5 mLs by mouth every 4 (four) hours as needed for severe pain.    Historical Provider, MD  pantoprazole (PROTONIX) 40 MG tablet Take 1 tablet (40 mg total) by mouth daily. 03/02/14   Charlott Rakes, MD  polyethylene glycol (MIRALAX / GLYCOLAX) packet Take 17 g by mouth daily as needed for moderate constipation.    Historical Provider, MD  potassium chloride (K-DUR) 10 MEQ tablet Take 10 mEq by mouth 2 (two) times daily.    Historical Provider, MD  tiZANidine (ZANAFLEX) 4 MG tablet Take 8 mg by mouth every 8 (eight) hours as needed for muscle spasms.    Historical Provider, MD  Vitamin D, Ergocalciferol, (DRISDOL) 50000 UNITS CAPS capsule Take 50,000 Units by mouth every 7 (seven) days.    Historical Provider, MD  zolpidem (AMBIEN) 5 MG tablet Take 5 mg by mouth at bedtime as needed for sleep.    Historical Provider, MD   BP 152/97 mmHg  Pulse 51  Temp(Src) 97.9 F (36.6 C) (Axillary)  Resp 20  Ht 5\' 5"  (1.651 m)  Wt 150 lb (68.04 kg)  BMI 24.96 kg/m2  SpO2 100% Physical Exam  Constitutional:  Apparent tonic-clonic seizure upon entering room  HENT:  Head: Normocephalic and atraumatic.  Eyes: Pupils are equal, round, and reactive to light.  Neck: Normal range of motion. Neck supple.  Cardiovascular: Normal rate, regular rhythm and normal heart sounds.   Pulmonary/Chest: Effort normal and breath sounds normal.  Abdominal: Soft. Bowel sounds are  normal.  Musculoskeletal:  unable  Neurological:  unable  Skin: Skin is warm and dry.  Psychiatric:  unable  Nursing note and vitals reviewed.   ED Course  Procedures (including critical care time) Labs Review Labs Reviewed  BASIC METABOLIC PANEL - Abnormal; Notable for the following:    Potassium 3.6 (*)    BUN 5 (*)    Anion gap 17 (*)    All other components within normal limits  CBC WITH DIFFERENTIAL  ETHANOL  URINALYSIS, ROUTINE W REFLEX MICROSCOPIC  URINALYSIS, ROUTINE W REFLEX MICROSCOPIC  URINE RAPID DRUG SCREEN (HOSP PERFORMED)    Imaging Review No results found.   EKG Interpretation None     CRITICAL CARE Performed by: Nat Christen Total critical care  time: 30 Critical care time was exclusive of separately billable procedures and treating other patients. Critical care was necessary to treat or prevent imminent or life-threatening deterioration. Critical care was time spent personally by me on the following activities: development of treatment plan with patient and/or surrogate as well as nursing, discussions with consultants, evaluation of patient's response to treatment, examination of patient, obtaining history from patient or surrogate, ordering and performing treatments and interventions, ordering and review of laboratory studies, ordering and review of radiographic studies, pulse oximetry and re-evaluation of patient's condition. MDM   Final diagnoses:  Seizure    Uncertain etiology of patient's presentation. She seemed to respond to Ativan 1 mg IV. However she did not appear to be postictal. Patient discharged from [facility] yesterday. Discussed with internal medicine teaching service. Admit.    Nat Christen, MD 03/13/14 [phone]

## 2014-03-07 NOTE — ED Notes (Signed)
IV team at bedside attempting peripheral site.

## 2014-03-07 NOTE — ED Notes (Signed)
Jacqulynn Cadet (son): [phone] Ronny Bacon (Daughter): [phone]

## 2014-03-07 NOTE — ED Notes (Signed)
This RN spoke with CT, who states that the pt is finished with her scan. This RN to send a tech to take the pt upstairs.

## 2014-03-07 NOTE — Progress Notes (Signed)
Report received from ED RN. Pt coming to 4N23 after CT abdomen completed.

## 2014-03-07 NOTE — ED Notes (Addendum)
Per EMS; she called son for shortness of breath; EMS arrived to find her in floor fluttering eyes; would not speak while in the house; began to talk and be alert in route. In triage, seized with incontinence of urine. Currently moaning; oriented to self; alert, anxious. Discharged last night from hospital.

## 2014-03-07 NOTE — H&P (Signed)
Date: 03/07/2014               Patient Name:  Shelly Le MRN: [id number]  DOB: [birth year] Age / Sex: 53 y.o., female   PCP: Shelly Dan, MD         Medical Service: Internal Medicine Teaching Service         Attending Physician: Dr. Beryle Beams    First Contact: Dr. Posey Pronto Pager: [phone]  Second Contact: Dr. Denton Brick Pager: [phone]       After Hours (After 5p/  First Contact Pager: [phone]  weekends / holidays): Second Contact Pager: [phone]   Chief Complaint: nausea, vomiting, abdominal pain  History of Present Illness: Shelly Le is a 53 yo female with PMHx of seizure history, addison disease, diabetes inspidus, hypothyroidism, and anemia who was recently discharged from the hospital on 03/06/14 for seizure disorder and CAP. Most of history is taken from son, as patient is in severe distress. Son states that his mother called him aroun 1300 screaming into the phone. He called EMS and rushed to her home to find her laying on the floor, unresponsive. Patient was still breathing and was not in cardiac arrest. Patient did not speak while in her home, but became more responsive with EMS. Upon talking with the patient, she states she had a sudden onset of nausea, vomiting, and abdominal pain that started this afternoon. She began vomiting non-bloody yellow-green material. She has been having bowel movement--soft stool. She describes her abdominal pain as severe, right sided and epigastric, radiating to her back like an alien was trying to get out of her. Nothing makes the pain better. She has not tried anything for the pain. She has never had pain like this before. Her only abdominal surgery was a PEG placement and hysterectomy.  Meds: Prescriptions prior to admission  Medication Sig Dispense Refill Last Dose  . alendronate (FOSAMAX) 70 MG tablet Take 70 mg by mouth once a week. Take with a full glass of water on an empty stomach.   unknown  . ALPRAZolam (XANAX) 1 MG tablet Take 1 mg by  mouth 3 (three) times daily as needed for sleep (slep).    03/06/2014 at Unknown time  . azelastine (OPTIVAR) 0.05 % ophthalmic solution Place 1 drop into both eyes 2 (two) times daily.   unknown  . butalbital-acetaminophen-caffeine (FIORICET WITH CODEINE) 50-325-40-30 MG per capsule Take 1 capsule by mouth every 4 (four) hours as needed for headache or migraine.   unknown  . Calcium Carb-Cholecalciferol (CALCIUM-VITAMIN D3) 600-500 MG-UNIT CAPS Take 1 tablet by mouth 2 (two) times daily.   unknown  . CARBONYL IRON PO Take 1 tablet by mouth daily.   unknown  . cefdinir (OMNICEF) 300 MG capsule Take 1 capsule (300 mg total) by mouth 2 (two) times daily. 4 capsule 0 unknown  . desmopressin (DDAVP) 0.01 % nasal solution Place 10 mcg into the nose 2 (two) times daily.   unknown  . diclofenac sodium (VOLTAREN) 1 % GEL Apply 2 g topically daily as needed (for pain).   unknown  . dimenhyDRINATE (DRAMAMINE) 50 MG tablet Take 50 mg by mouth at bedtime as needed (for sleep).   unknown  . eszopiclone (LUNESTA) 1 MG TABS tablet Take 2 mg by mouth at bedtime. Take immediately before bedtime   unknown  . FIBER PO Take 2 tablets by mouth 2 (two) times daily.   unknown  . gabapentin (NEURONTIN) 300 MG capsule Take 300 mg by mouth 3 (  three) times daily.   03/06/2014 at Unknown time  . HYDROcodone-acetaminophen (NORCO) 10-325 MG per tablet Take 1 tablet by mouth every 6 (six) hours as needed for moderate pain.   03/06/2014 at Unknown time  . hydrocortisone (CORTEF) 20 MG tablet Take 2 tablets in the morning, 1 in the evening. 30 tablet 0 03/06/2014 at Unknown time  . lacosamide 100 MG TABS Take 1 tablet (100 mg total) by mouth 2 (two) times daily. 60 tablet 0 03/06/2014 at Unknown time  . levETIRAcetam (KEPPRA) 750 MG tablet Take 2 tablets (1,500 mg total) by mouth 2 (two) times daily. 60 tablet 0 03/06/2014 at 1000 pm  . levothyroxine (SYNTHROID, LEVOTHROID) 100 MCG tablet Take 100 mcg by mouth daily before breakfast.    03/06/2014 at Unknown time  . mirtazapine (REMERON) 15 MG tablet Take 15 mg by mouth at bedtime.   unknown  . olopatadine (PATANOL) 0.1 % ophthalmic solution Place 1 drop into both eyes 2 (two) times daily.   03/06/2014 at Unknown time  . ondansetron (ZOFRAN-ODT) 4 MG disintegrating tablet Take 4 mg by mouth every 6 (six) hours as needed for nausea or vomiting.   unknown  . oxyCODONE-acetaminophen (PERCOCET/ROXICET) 5-325 MG per tablet Take 1 tablet by mouth every 6 (six) hours.   unknown  . oxyCODONE-acetaminophen (ROXICET) 5-325 MG/5ML solution Take 5 mLs by mouth every 4 (four) hours as needed for severe pain.   unknown  . pantoprazole (PROTONIX) 40 MG tablet Take 1 tablet (40 mg total) by mouth daily. 30 tablet 0 03/06/2014 at Unknown time  . polyethylene glycol (MIRALAX / GLYCOLAX) packet Take 17 g by mouth daily as needed for moderate constipation.   unknown  . potassium chloride (K-DUR) 10 MEQ tablet Take 10 mEq by mouth 2 (two) times daily.   unknown  . promethazine (PHENERGAN) 25 MG tablet Take 25 mg by mouth every 6 (six) hours as needed for nausea or vomiting (nausea & vomiting).   03/06/2014 at Unknown time  . tiZANidine (ZANAFLEX) 4 MG tablet Take 8 mg by mouth every 8 (eight) hours as needed for muscle spasms.   03/06/2014 at Unknown time  . Vitamin D, Ergocalciferol, (DRISDOL) 50000 UNITS CAPS capsule Take 50,000 Units by mouth every 7 (seven) days.   unknown  . zolpidem (AMBIEN) 5 MG tablet Take 5 mg by mouth at bedtime as needed for sleep.   unknown    Current Facility-Administered Medications  Medication Dose Route Frequency Provider Last Rate Last Dose  . potassium chloride 10 mEq in 100 mL IVPB  10 mEq Intravenous Q1 Hr x 4 Corky Sox, MD        Allergies: Allergies as of 03/07/2014 - Review Complete 03/07/2014  Allergen Reaction Noted  . Contrast media [iodinated diagnostic agents] Rash and Shortness Of Breath 10/19/2012  . Fentanyl Other (See Comments) 02/28/2014  .  Morphine Hives and Shortness Of Breath 02/26/2014  . Shellfish allergy Anaphylaxis and Hives 07/13/2013  . Latex  02/26/2014  . Penicillins Nausea And Vomiting 02/21/2014  . Metrizamide Rash 02/26/2014   Past Medical History  Diagnosis Date  . Addison's disease   . Diabetes insipidus   . Pneumonia   . Seizures   . Brain tumor    Past Surgical History  Procedure Laterality Date  . Tonsillectomy    . Abdominal hysterectomy    . Brain surgery      crainotomy   History reviewed. No pertinent family history. History   Social History  .  Marital Status: Divorced    Spouse Name: N/A    Number of Children: N/A  . Years of Education: N/A   Occupational History  . Not on file.   Social History Main Topics  . Smoking status: Never Smoker   . Smokeless tobacco: Never Used  . Alcohol Use: No  . Drug Use: No  . Sexual Activity: Not on file   Other Topics Concern  . Not on file   Social History Narrative    Review of Systems: General: Denies fever, chills, fatigue, change in appetite and diaphoresis.  Respiratory: Admits to SOB, Denies cough, DOE, chest tightness, and wheezing.   Cardiovascular: Denies chest pain and palpitations.  Gastrointestinal: Admits to nausea, vomiting, abdominal pain, diarrhea, Denies constipation, blood in stool and abdominal distention.  Genitourinary: Denies dysuria, urgency, frequency, hematuria, suprapubic pain and flank pain. Endocrine: Denies hot or cold intolerance, polyuria, and polydipsia. Musculoskeletal: Denies myalgias, back pain, joint swelling, arthralgias and gait problem.  Skin: Denies pallor, rash and wounds.  Neurological: Denies dizziness, headaches, weakness, lightheadedness, numbness, and syncope, Psychiatric/Behavioral: Denies mood changes, confusion, nervousness, sleep disturbance and agitation.  Physical Exam: Filed Vitals:   03/07/14 1630 03/07/14 1700 03/07/14 1800 03/07/14 1915  BP: 159/79 153/84 130/106 144/71  Pulse:  52 53 104   Temp:      TempSrc:      Resp: 18 29 29    Height:      Weight:      SpO2: 100% 99% 100%    General: Vital signs reviewed.  Patient is an anxious, tearful women in severe distress and minimally cooperative with exam.  Cardiovascular: Tachycardic, regular rhythm, S1 normal, S2 normal, no murmurs, gallops, or rubs. Pulmonary/Chest: Clear to auscultation bilaterally, no wheezes, rales, or rhonchi. Abdominal: Soft, diffusely tender, non-distended, hypoactive BS +, no masses, organomegaly, or guarding present.  Musculoskeletal: No joint deformities, erythema, or stiffness, ROM full and nontender. Extremities: No lower extremity edema bilaterally,  pulses symmetric and intact bilaterally. No cyanosis or clubbing.  Skin: Warm, dry and intact. No rashes or erythema. Psychiatric: Anxious and tearful. Yelling out in pain.   Lab results: Basic Metabolic Panel:  Recent Labs  03/06/14 0253 03/07/14 1551  NA 133* 140  K 4.2 3.6*  CL 97 102  CO2 21 21  GLUCOSE 94 86  BUN 5* 5*  CREATININE 0.76 0.78  CALCIUM 9.0 10.1   CBC:  Recent Labs  03/05/14 1150 03/07/14 1551  WBC 7.9 8.2  NEUTROABS  --  5.6  HGB 9.3* 13.6  HCT 27.5* 39.6  MCV 79.3 78.3  PLT 167 262   Cardiac Enzymes:  Recent Labs  03/05/14 2153 03/06/14 0253 03/06/14 1428  TROPONINI <0.30 <0.30 <0.30   Urine Drug Screen: Drugs of Abuse     Component Value Date/Time   LABOPIA NONE DETECTED 03/07/2014 1726   COCAINSCRNUR NONE DETECTED 03/07/2014 1726   LABBENZ POSITIVE* 03/07/2014 1726   AMPHETMU NONE DETECTED 03/07/2014 1726   THCU NONE DETECTED 03/07/2014 1726   LABBARB NONE DETECTED 03/07/2014 1726    Alcohol Level:  Recent Labs  03/07/14 1551  ETH <11   Urinalysis:  Recent Labs  03/07/14 1726  COLORURINE YELLOW  LABSPEC 1.007  PHURINE 8.0  GLUCOSEU NEGATIVE  HGBUR SMALL*  BILIRUBINUR NEGATIVE  KETONESUR NEGATIVE  PROTEINUR NEGATIVE  UROBILINOGEN 0.2  NITRITE NEGATIVE    LEUKOCYTESUR TRACE*   Assessment & Plan by Problem:  Abdominal Pain, Nausea and Intractable Vomiting: Patient presents with a one day  history of right sided and epigastric pain that is severe and constant in nature with radiation to the back. Patient also has nausea with intractable bilious vomiting. Patient received phenergan and zofran in the ED with little relief. Etiology of abdominal pain is unclear at this time. We discussed medication side effects with Dr. Leonel Ramsay who did not think nausea and vomiting was related to seizure medications. Location of pain is difficult to assess and we will need to rule out mesenteric ischemia (few risk factors) versus gallbladder disease (not post-prandial), versus nephrolithiasis (not colicky pain) versus gastroenteritis (no diarrhea, no leukocytosis or fever, severe abdominal pain), versus pancreatitis (lipase pending, no alcohol) versus SBO (unlikely because she had bowel movement). We will assess this with CT scan. -Lipase -CT abdomen/pelvis  -Repeat EKG in am -Regular Diet -NS 125 mL/hr  -Norco 10-325 mg Q6H prn pain -Zofran 4 mg Q6H prn -BMET/CBC/Mag tomorrow am -Hepatic Function Panel -Cardiac Monitoring  Seizures: Patient has a questionable recurrent seizure. 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 Patient was questionably incontinent of urine, but she also had urinary incontinence. She did not remember having a seizure and one was not witnessed. During last admission, per discharge note, neurology recommended increasing her home dose of Keppra to 1250mg  BID in the setting of non-adherence to treatment. On 10/28, she was found to have a Fentanyl patch and requested another one. Given its potential to increase her risk of seizures, she was given oxycodone instead. She was felt to be stable for discharge on 10/29 though she deferred until leaving in the morning. On 03/02/2014, she experienced another tonic-clonic seizure that was treated with 6 mg Ativan and 100  mg Vimpat and was transported to the ICU where seizure-like activity abated prior to intubation. She had no further seizure-like activity in the ICU and was transferred back to the floor on 03/04/14. Her medications were changed to 1500mg  Keppra BID and 100mg  Vimpat BID and was discharged on this regimen. -Vimpat 100 mg IV BID -Keppra 1500 mg IV BID -Neuro checks -Seizure precautions -Strict Bed Rest  CAP: Patient was diagnosed with CAP during previous admission. She completed a 5-day course of azithromycin and was discharged on a 2-day course of cefdinir to finish an equivalent course. -Ceftriaxone 1 gram IV daily  Addison's Disease: Patient is on hydrocortisone 20mg  qAM and 10mg  qPM at home. -Hydrocortisone 20 mg IV QPM -Hydrocortisone 40 mg IV QAM  Chronic Pain: During last admission, opioids were discontinued, but gabapentin, voltaren gel, and tizanidine were continued. Patient is currently in severe pain. -Norco 10-325 Q6H prn -Gabapentin 300 mg TID -Tizanidine 8 mg Q8H prn  Anxiety: Patient is very anxious and tearful. She is on Xanax, Lunesta, and Ambien at home. The medications were discontinued at the time of discharge and encouraged to be cautious with the use of sedative medications since they provoke seizure-like activity. -Xanax 1 mg TID prn -Ativan 1-2 mg Q4H prn anxiety  Hypothyroidism: TSH 0.036,T4 1.19. On Synthroid 100 mcg daily at home. -Continue home synthroid 100 mcg daily  Anemia of chronic disease: Previous admission showed low Fe, TIBC and normal ferritin.  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 -Continue desmopressin 10 mcg BID  DVT/PE ppx: Lovenox 40 mg daily  Dispo: Disposition is deferred at this time, awaiting improvement of current medical problems. Anticipated discharge in approximately 1-2 day(s).   The patient does have a current PCP (Shelly Dan, MD) and does not need an [name] hospital follow-up appointment after discharge.  The  patient does not have transportation limitations that hinder transportation to clinic appointments.  Signed: Osa Craver, DO PGY-1 Internal Medicine Resident Pager # [phone] 03/07/2014 8:31 PM

## 2014-03-07 NOTE — Progress Notes (Signed)
Late entry:  Patient left floor at 2120 pm for discharge with family.

## 2014-03-07 NOTE — ED Notes (Signed)
Attempted report 

## 2014-03-07 NOTE — ED Notes (Addendum)
This Rn spoke with family, who is aware that the pt will go directly to the floor after her CT scan is finished.

## 2014-03-07 NOTE — ED Notes (Signed)
Dr. Ronnald Ramp returned page; she may go for CT without oral contrast. He spoke with CT and they are aware.

## 2014-03-08 LAB — LIPASE, BLOOD: Lipase: 62 U/L — ABNORMAL HIGH (ref 11–59)

## 2014-03-08 LAB — LACTIC ACID, PLASMA: Lactic Acid, Venous: 1.5 mmol/L (ref 0.5–2.2)

## 2014-03-08 LAB — CBC
HEMATOCRIT: 30.9 % — AB (ref 36.0–46.0)
Hemoglobin: 10.4 g/dL — ABNORMAL LOW (ref 12.0–15.0)
MCH: 27.2 pg (ref 26.0–34.0)
MCHC: 33.7 g/dL (ref 30.0–36.0)
MCV: 80.7 fL (ref 78.0–100.0)
PLATELETS: 246 10*3/uL (ref 150–400)
RBC: 3.83 MIL/uL — AB (ref 3.87–5.11)
RDW: 14 % (ref 11.5–15.5)
WBC: 11.4 10*3/uL — ABNORMAL HIGH (ref 4.0–10.5)

## 2014-03-08 LAB — HEPATIC FUNCTION PANEL
ALBUMIN: 3.7 g/dL (ref 3.5–5.2)
ALT: 22 U/L (ref 0–35)
AST: 15 U/L (ref 0–37)
Alkaline Phosphatase: 72 U/L (ref 39–117)
BILIRUBIN TOTAL: 0.2 mg/dL — AB (ref 0.3–1.2)
Total Protein: 6.9 g/dL (ref 6.0–8.3)

## 2014-03-08 LAB — BASIC METABOLIC PANEL
Anion gap: 18 — ABNORMAL HIGH (ref 5–15)
BUN: 6 mg/dL (ref 6–23)
CALCIUM: 9.1 mg/dL (ref 8.4–10.5)
CO2: 20 mEq/L (ref 19–32)
CREATININE: 0.86 mg/dL (ref 0.50–1.10)
Chloride: 110 mEq/L (ref 96–112)
GFR, EST AFRICAN AMERICAN: 87 mL/min — AB (ref >90)
GFR, EST NON AFRICAN AMERICAN: 75 mL/min — AB (ref >90)
GLUCOSE: 99 mg/dL (ref 70–99)
Potassium: 3.6 mEq/L — ABNORMAL LOW (ref 3.7–5.3)
Sodium: 148 mEq/L — ABNORMAL HIGH (ref 137–147)

## 2014-03-08 LAB — MAGNESIUM: MAGNESIUM: 2.2 mg/dL (ref 1.5–2.5)

## 2014-03-08 LAB — LACTATE DEHYDROGENASE: LDH: 272 U/L — AB (ref 94–250)

## 2014-03-08 MED ORDER — LEVOTHYROXINE SODIUM 100 MCG IV SOLR
50.0000 ug/h | Freq: Every day | INTRAVENOUS | Status: DC
  Filled 2014-03-08: qty 10

## 2014-03-08 MED ORDER — LORAZEPAM 2 MG/ML IJ SOLN
1.0000 mg | INTRAMUSCULAR | Status: DC | PRN
  Administered 2014-03-08 – 2014-03-11 (×16): 1 mg via INTRAVENOUS
  Filled 2014-03-08 (×16): qty 1

## 2014-03-08 MED ORDER — LEVETIRACETAM IN NACL 1500 MG/100ML IV SOLN
1500.0000 mg | Freq: Two times a day (BID) | INTRAVENOUS | Status: DC
  Administered 2014-03-08 – 2014-03-11 (×5): 1500 mg via INTRAVENOUS
  Filled 2014-03-08 (×7): qty 100

## 2014-03-08 MED ORDER — DEXTROSE 5 % IV SOLN
1.0000 g | INTRAVENOUS | Status: DC
  Filled 2014-03-08: qty 10

## 2014-03-08 MED ORDER — HYDROMORPHONE HCL 1 MG/ML IJ SOLN
1.0000 mg | INTRAMUSCULAR | Status: DC | PRN
  Administered 2014-03-08 – 2014-03-11 (×15): 1 mg via INTRAVENOUS
  Filled 2014-03-08 (×15): qty 1

## 2014-03-08 MED ORDER — PROMETHAZINE HCL 25 MG/ML IJ SOLN
12.5000 mg | Freq: Once | INTRAMUSCULAR | Status: AC
  Administered 2014-03-08: 12.5 mg via INTRAVENOUS

## 2014-03-08 MED ORDER — CEFTRIAXONE SODIUM 1 G IJ SOLR
1.0000 g | INTRAMUSCULAR | Status: AC
Start: 2014-03-09 — End: 2014-03-09
  Administered 2014-03-09: 1 g via INTRAVENOUS
  Filled 2014-03-08: qty 10

## 2014-03-08 MED ORDER — LEVOTHYROXINE SODIUM 100 MCG IV SOLR
50.0000 ug | Freq: Every day | INTRAVENOUS | Status: DC
  Administered 2014-03-09 – 2014-03-10 (×2): 50 ug via INTRAVENOUS
  Filled 2014-03-08 (×4): qty 5

## 2014-03-08 MED ORDER — HYDROMORPHONE HCL 1 MG/ML IJ SOLN
1.0000 mg | Freq: Once | INTRAMUSCULAR | Status: AC
  Administered 2014-03-08: 1 mg via INTRAVENOUS
  Filled 2014-03-08: qty 1

## 2014-03-08 NOTE — Progress Notes (Signed)
Pt c/o of burning sensation at the iv site due to iv potassium, infusing at 50lml/hr alongside in ns at 127ml/hr, Dr Raelene Bott (on call) paged and notified, ordered to d/c remaining dose of iv potassium, same related to pt,pt reassured, will continue to monitor. Obasogie-Asidi, Allyana Vogan Efe

## 2014-03-08 NOTE — Progress Notes (Signed)
CARE MANAGEMENT NOTE 03/08/2014  Patient:  Shelly Le, Shelly Le   Account Number:  [id number]  Date Initiated:  03/08/2014  Documentation initiated by:  Olga Coaster  Subjective/Objective Assessment:   ADMITTED WITH SEIZURE     Action/Plan:   CM FOLLOWING FOR DCP  PATIENT IS ACTIVE WITH [facility] AS PRIOR TO ADMISSION   Anticipated DC Date:  03/08/2014   Anticipated DC Plan:  [place] [place] [facility]  CM consult        Status of service:  In process, will continue to follow Medicare Important Message given?   (If response is "NO", the following Medicare IM given date fields will be blank)  Per UR Regulation:  Reviewed for med. necessity/level of care/duration of stay  Comments:  03/08/2014- B Jakera Beaupre RN,BSN,MHA

## 2014-03-08 NOTE — Progress Notes (Signed)
UR COMPLETED  

## 2014-03-08 NOTE — Progress Notes (Signed)
IMTS night float progress note  S: Called about patient being in severe, frantic pain and asking for Dilaudid. Upon interview, patient states that she is in severe abdominal pain that is diffuse throughout her abdomen. Patient was informed about the results of her CT abdomen and pelvis which did not show any clear evidence for an etiology for her abdominal pain. She was notified of a incidental renal lesion that may need further workup, but would not be related to her current presentation. Patient also states that she has been having bilious vomiting. Patient also reports severe and intolerable pain from her IV site where potassium was being infused. Patient is asking for Dilaudid since she cannot keep any oral pain medications down.  ODanley Danker Vitals:   03/07/14 2212  BP: 150/66  Pulse: 53  Temp:   Resp: 24   General: writhing around in bed in acute distress, loud screaming and moaning Cardiac: regular, tachycardic, no rubs, murmurs or gallops Pulm: clear to auscultation bilaterally, moving normal volumes of air Abd: soft, bowel sounds present, diffuse tenderness  A/P: Patient was initially admitted for reported suspected pseudoseizures versus seizures at home, but also complaining of some abdominal pain. CT without contrast of abdomen (because of contrast allergy) not showing any etiologies for abdominal pain. Lactate slightly elevated at 2.6 in the context of abdominal pain could be concerning for mesenteric or bowel ischemia. However, the patient has very few risk factors and no evidence of atherosclerosis. Additionally, recent possible seizures could also be contributory. Finally, an elevated lactate would be evidence of very advanced ischemic colitis that would likely manifest even on a noncontrasted CT study.  - FOBT  - Continue to monitor. Could consider an MRA if ischemia continues to be on the differential.  - Given that patient cannot take POs secondary to vomiting, 1 mg Dilaudid.
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
## 2014-03-08 NOTE — Progress Notes (Signed)
Chaplain referred to pt by nursing staff. Chaplain listened to pt spiritual story. Pt felt called to ministry as a teenager. Pt father pastor, patient sister is Company secretary as well. Pt told chaplain about presence of "demons" that are in the world and in people in the hospital. Her analysis of who has a demon affects who she wants in her room. Presence of demons and devil seem to be a central part of pt theology. Chaplain will assess this central theme further at follow up. Pt states that she trusts the nurse working with her today. Pt has a long history of illness which has caused her distress. Chaplain offered prayer, a Bible, and prayer shawl. This all seemed to give pt comfort. Chaplain will follow. Page chaplain if needed sooner.    03/08/14 1000  Clinical Encounter Type  Visited With Patient;Family;Health care provider  Visit Type Initial;Spiritual support  Referral From Nurse  Consult/Referral To Chaplain  Recommendations Follow Up  Spiritual Encounters  Spiritual Needs Prayer;Emotional;Sacred text  Stress Factors  Family Stress Factors Other (Comment) (Spiritual Distress)  Kaj Vasil, Barbette Hair, Chaplain 03/08/2014 10:22 AM

## 2014-03-08 NOTE — Progress Notes (Signed)
MD notified about patient throwing up this morning.  MD gave a one time order of phenergran.  Will continue to monitor patient.

## 2014-03-08 NOTE — Progress Notes (Signed)
Patient was complaining of pain, rating pain greater than scale of 0-10; patient writhing around in bed and moaning.  Medicated patient with a one time dose 1mg  of Dilaudid IV, which was given via slow IVP.  Patient tolerated medication without difficulty.  Will continue to monitor patient and address needs as required.

## 2014-03-08 NOTE — Progress Notes (Signed)
Took over pt's care at 2100 with pt observed to be crying for burning sensation from the iv potassium infusing through the left arm, infusion reconnected to iv ns at 154ml/hr, also c/o of pain and nausea, already vomited several times, Dr Raelene Bott (on call) paged and notified, promised to come see pt, IV dilaudid 1mg  given at 2225 and iv phernegan 12.5mg  given at 2221 as ordered, pt reassured. [place]-Asidi, Kaylin Marcon Efe

## 2014-03-08 NOTE — Progress Notes (Signed)
Patient reported having a larger emesis; held up an emesis bag.  Upon examination, emesis is noted to be small amount of bile colored emesis spit upon a paper towel.  While nurses in the room, patient is only spitting out frothy sputum.  No major bouts of emesis noted to date while nurses or tech has been in the room.  Doctor is aware of the type and quantity of emesis.  No new orders received at this time.
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
## 2014-03-09 ENCOUNTER — Inpatient Hospital Stay (HOSPITAL_COMMUNITY): Payer: Medicare Other

## 2014-03-09 DIAGNOSIS — E230 Hypopituitarism: Secondary | ICD-10-CM

## 2014-03-09 DIAGNOSIS — R112 Nausea with vomiting, unspecified: Secondary | ICD-10-CM

## 2014-03-09 DIAGNOSIS — R109 Unspecified abdominal pain: Secondary | ICD-10-CM | POA: Diagnosis present

## 2014-03-09 DIAGNOSIS — R1310 Dysphagia, unspecified: Secondary | ICD-10-CM

## 2014-03-09 DIAGNOSIS — N289 Disorder of kidney and ureter, unspecified: Secondary | ICD-10-CM

## 2014-03-09 DIAGNOSIS — R569 Unspecified convulsions: Secondary | ICD-10-CM

## 2014-03-09 LAB — BASIC METABOLIC PANEL
ANION GAP: 15 (ref 5–15)
BUN: 6 mg/dL (ref 6–23)
CALCIUM: 8.7 mg/dL (ref 8.4–10.5)
CO2: 22 meq/L (ref 19–32)
CREATININE: 0.91 mg/dL (ref 0.50–1.10)
Chloride: 111 mEq/L (ref 96–112)
GFR calc Af Amer: 81 mL/min — ABNORMAL LOW (ref >90)
GFR calc non Af Amer: 70 mL/min — ABNORMAL LOW (ref >90)
GLUCOSE: 79 mg/dL (ref 70–99)
Potassium: 3.6 mEq/L — ABNORMAL LOW (ref 3.7–5.3)
Sodium: 148 mEq/L — ABNORMAL HIGH (ref 137–147)

## 2014-03-09 LAB — CBC
HEMATOCRIT: 29.7 % — AB (ref 36.0–46.0)
Hemoglobin: 9.9 g/dL — ABNORMAL LOW (ref 12.0–15.0)
MCH: 26.8 pg (ref 26.0–34.0)
MCHC: 33.3 g/dL (ref 30.0–36.0)
MCV: 80.3 fL (ref 78.0–100.0)
Platelets: 241 10*3/uL (ref 150–400)
RBC: 3.7 MIL/uL — ABNORMAL LOW (ref 3.87–5.11)
RDW: 14.1 % (ref 11.5–15.5)
WBC: 9.9 10*3/uL (ref 4.0–10.5)

## 2014-03-09 LAB — LIPASE, BLOOD: Lipase: 57 U/L (ref 11–59)

## 2014-03-09 LAB — T4, FREE: Free T4: 1.27 ng/dL (ref 0.80–1.80)

## 2014-03-09 MED ORDER — POTASSIUM CHLORIDE IN NACL 20-0.9 MEQ/L-% IV SOLN
INTRAVENOUS | Status: DC
  Administered 2014-03-09 – 2014-03-10 (×5): via INTRAVENOUS
  Administered 2014-03-11: 1000 mL via INTRAVENOUS
  Administered 2014-03-12 – 2014-03-13 (×4): via INTRAVENOUS
  Filled 2014-03-09 (×13): qty 1000

## 2014-03-09 MED ORDER — POTASSIUM CHLORIDE IN NACL 20-0.45 MEQ/L-% IV SOLN: INTRAVENOUS | Status: DC

## 2014-03-09 NOTE — Progress Notes (Signed)
[facility] Progress Note     Subjective: Pt looks much better this am.  No N/V, pain well controlled.  Says pain was in RUQ, LLQ and suprapubic.  Radiates to back.  Some flatus, BM on 11/4.    Objective: Vital signs in last 24 hours: Temp:  [97.7 F (36.5 C)-100.1 F (37.8 C)] 98.6 F (37 C) (11/06 0551) Pulse Rate:  [57-79] 75 (11/06 0551) Resp:  [18-20] 18 (11/06 0551) BP: (105-162)/(51-90) 112/51 mmHg (11/06 0551) SpO2:  [97 %-100 %] 97 % (11/06 0551) Last BM Date: 03/07/14  Intake/Output from previous day:   Intake/Output this shift:    PE: Gen:  Alert, NAD, pleasant Abd: Soft, minimally tender in RUQ, LLQ, NT with distracted exam, +BS, no HSM   Lab Results:   Recent Labs  03/08/14 0835 03/09/14 0524  WBC 11.4* 9.9  HGB 10.4* 9.9*  HCT 30.9* 29.7*  PLT 246 241   BMET  Recent Labs  03/08/14 0835 03/09/14 0524  NA 148* 148*  K 3.6* 3.6*  CL 110 111  CO2 20 22  GLUCOSE 99 79  BUN 6 6  CREATININE 0.86 0.91  CALCIUM 9.1 8.7   PT/INR No results for input(s): LABPROT, INR in the last 72 hours. CMP     Component Value Date/Time   NA 148* 03/09/2014 0524   K 3.6* 03/09/2014 0524   CL 111 03/09/2014 0524   CO2 22 03/09/2014 0524   GLUCOSE 79 03/09/2014 0524   BUN 6 03/09/2014 0524   CREATININE 0.91 03/09/2014 0524   CALCIUM 8.7 03/09/2014 0524   PROT 6.9 03/07/2014 2035   ALBUMIN 3.7 03/07/2014 2035   AST 15 03/07/2014 2035   ALT 22 03/07/2014 2035   ALKPHOS 72 03/07/2014 2035   BILITOT 0.2* 03/07/2014 2035   GFRNONAA 70* 03/09/2014 0524   GFRAA 81* 03/09/2014 0524   Lipase     Component Value Date/Time   LIPASE 62* 03/07/2014 2035       Studies/Results: Ct Abdomen Pelvis Wo Contrast  03/07/2014   CLINICAL DATA:  Severe abdominal pain with nausea and vomiting.  EXAM: CT ABDOMEN AND PELVIS WITHOUT CONTRAST  TECHNIQUE: Multidetector CT imaging of the abdomen and pelvis was performed following the standard protocol without  IV contrast.  COMPARISON:  None.  FINDINGS: There appears to be a trace pleural fluid bilaterally. Small hiatal hernia. No evidence for free air.  Scattered areas of high density in the subcutaneous tissue probably related to injection sites. Unenhanced CT was performed per clinician order. Lack of IV contrast limits sensitivity and specificity, especially for evaluation of abdominal/pelvic solid viscera.  No gross abnormality to the liver, gallbladder, spleen, adrenal glands or right kidney. Normal appearance of the bladder. There is mild fullness of the left renal collecting system and there is concern for a lesion along the left kidney lower pole on sequence 2, image 31. This area roughly measures 1.9 cm. There is no left perinephric edema. There is no gross abnormality to the pancreas. No significant free fluid or lymphadenopathy.  Trace amount of free fluid in the pelvis. Uterus has been removed and ovaries are not confidently identified. Normal appearance of the colon and appendix. There is minimal stranding in the upper abdominal mesentery which is nonspecific. No evidence for bowel obstruction. No acute bone abnormality.  IMPRESSION: Concern for a lesion in the left kidney lower pole, roughly measuring 1.9 cm. This could represent a neoplastic or inflammatory process. There is no significant inflammatory  changes around the left kidney. Patient has a contrast allergy but she may be able to get gadolinium for an MRI study. Recommend a pre and post contrast MRI for further evaluation of the left kidney lesion.  Trace amount of fluid in the pelvis of unknown etiology. Unclear if both ovaries have been removed. Uterus has been removed.  No evidence for acute bowel inflammation. There is mild edema in the upper abdominal mesentery which is nonspecific.  Trace amount of pleural fluid versus pleural thickening.  Small hiatal hernia.   Electronically Signed   By: Markus Daft M.D.   On: 03/07/2014 20:16     Anti-infectives: Anti-infectives    Start     Dose/Rate Route Frequency Ordered Stop   03/09/14 0100  cefTRIAXone (ROCEPHIN) 1 g in dextrose 5 % 50 mL IVPB     1 g100 mL/hr over 30 Minutes Intravenous Every 24 hours 03/08/14 1721 03/09/14 0207   03/08/14 2200  cefTRIAXone (ROCEPHIN) 1 g in dextrose 5 % 50 mL IVPB  Status:  Discontinued     1 g100 mL/hr over 30 Minutes Intravenous Every 24 hours 03/08/14 0802 03/08/14 1721   03/07/14 2100  cefTRIAXone (ROCEPHIN) 1 g in dextrose 5 % 50 mL IVPB  Status:  Discontinued     1 g100 mL/hr over 30 Minutes Intravenous Every 24 hours 03/07/14 2034 03/08/14 0802       Assessment/Plan RUQ, RLQ and epigastric abdominal pain Subjective nausea & vomiting - not witnessed Edema to the upper abdominal mesentary Leukocytosis Mildly elevated lipase Left kidney lesion  Plan: 1. Her LFT's are normal, her WBC is now normal. Her pain is currently well controlled with the dilaudid. Her subjective symptoms of frequent large amounts of nausea and vomiting have not been witnessed by providers, nurses, or techs. She does not have an acute abdomen. Her abdomen is soft and minimally tender with distracted exam. Her subjective pain is out of proportion to her exam. She had a non-contrasted CT scan because she says she's allergic, but then on further questioning, she has done well with contrast dye "when it was pushed slowly into the veins" 2. NPO, bowel rest, IVF, pain control, antiemetics 3. Dr. Brantley Stage has recommended that we start with an ultrasound to rule out biliary colic/cholecystitis.  I looked through her medical record at [facility] and no ultrasound has been ever done.  If this is negative a HIDA could be used to confirm.  Don't see need in repeating CT with contrast for appendicitis. 4. Recommend conservative measures. Given the inconsistencies in her history/physical exam she may warrant a psych workup for conversion, malingering,  somatization at some point. This patient needs continuity of care, she seems to get frequent care at [facility] which is where most of her specialist are. 5.  Repeat lipase - may have had mild pancreatitis    LOS: 2 days    DORT, Joellyn Grandt 03/09/2014, 8:56 AM Pager: [phone]
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
## 2014-03-09 NOTE — Progress Notes (Signed)
Subjective: This AM, son is present with her at bedside. She appears much more rested and comfortable and feels the medications she has received have helped her. She knows she is due for an RUQ [place] study today.   Otherwise, she reports that "she would not be back here if she could help it" and confirms Dr. Azucena Freed resemblance to Thomes Dinning, the founder of [facility], and "has no problems telling it like it is because that's how I am." She also feels she would try to advance diet but is not allowed to do so by her current diet.   Per chart review, abdominal CT with contrast in 2009 showed simple renal cyst in L kidney done at [facility].   Objective: Vital signs in last 24 hours: Filed Vitals:   03/08/14 1700 03/08/14 2117 03/09/14 0107 03/09/14 0551  BP: 149/71 105/61 110/56 112/51  Pulse: 57 60 79 75  Temp: 97.7 F (36.5 C) 99.3 F (37.4 C) 98.8 F (37.1 C) 98.6 F (37 C)  TempSrc: Axillary Oral Oral Oral  Resp: 20 18 18 18   Height:      Weight:      SpO2: 100% 99% 99% 97%   Weight change:  No intake or output data in the 24 hours ending 03/09/14 0649  General: resting in bed comfortably HEENT: PERRL, EOMI, no scleral icterus Cardiac: RRR, no rubs, murmurs or gallops Pulm: clear to auscultation bilaterally, no wheezes, rales, or rhonchi Abd: soft, non-distended, mild tenderness to palpation in lower quadrants though improved from prior exam Ext: warm and well perfused, no pedal edema Neuro: alert and oriented X3,strength and sensation to light touch equal in bilateral upper and lower extremities, spontaneously moving all extremities   Lab Results: Basic Metabolic Panel:  Recent Labs Lab 03/07/14 1551 03/07/14 2035 03/08/14 0835  NA 140  --  148*  K 3.6*  --  3.6*  CL 102  --  110  CO2 21  --  20  GLUCOSE 86  --  99  BUN 5*  --  6  CREATININE 0.78  --  0.86  CALCIUM 10.1  --  9.1  MG  --  2.2  --    Liver Function Tests:  Recent Labs Lab  03/02/14 2120 03/07/14 2035  AST 82* 15  ALT 62* 22  ALKPHOS 105 72  BILITOT <0.2* 0.2*  PROT 6.7 6.9  ALBUMIN 3.3* 3.7    Recent Labs Lab 03/07/14 2035  LIPASE 62*   CBC:  Recent Labs Lab 03/07/14 1551 03/08/14 0835 03/09/14 0524  WBC 8.2 11.4* 9.9  NEUTROABS 5.6  --   --   HGB 13.6 10.4* 9.9*  HCT 39.6 30.9* 29.7*  MCV 78.3 80.7 80.3  PLT 262 246 241   Cardiac Enzymes:  Recent Labs Lab 03/05/14 2153 03/06/14 0253 03/06/14 1428  TROPONINI <0.30 <0.30 <0.30   Urine Drug Screen: Drugs of Abuse     Component Value Date/Time   LABOPIA NONE DETECTED 03/07/2014 1726   COCAINSCRNUR NONE DETECTED 03/07/2014 1726   LABBENZ POSITIVE* 03/07/2014 1726   AMPHETMU NONE DETECTED 03/07/2014 1726   THCU NONE DETECTED 03/07/2014 1726   LABBARB NONE DETECTED 03/07/2014 1726    Alcohol Level:  Recent Labs Lab 03/07/14 1551  ETH <11   Urinalysis:  Recent Labs Lab 03/02/14 1203 03/07/14 1726  COLORURINE YELLOW YELLOW  LABSPEC 1.011 1.007  PHURINE 7.0 8.0  GLUCOSEU NEGATIVE NEGATIVE  HGBUR NEGATIVE SMALL*  BILIRUBINUR NEGATIVE NEGATIVE  KETONESUR NEGATIVE  NEGATIVE  PROTEINUR NEGATIVE NEGATIVE  UROBILINOGEN 0.2 0.2  NITRITE NEGATIVE NEGATIVE  LEUKOCYTESUR TRACE* TRACE*   Micro Results: Recent Results (from the past 240 hour(s))  Rapid strep screen     Status: None   Collection Time: 02/27/14  2:55 PM  Result Value Ref Range Status   Streptococcus, Group A Screen (Direct) NEGATIVE NEGATIVE Final    Comment: (NOTE) A Rapid Antigen test may result negative if the antigen level in the sample is below the detection level of this test. The FDA has not cleared this test as a stand-alone test therefore the rapid antigen negative result has reflexed to a Group A Strep culture.  Culture, Group A Strep     Status: None   Collection Time: 02/27/14  2:55 PM  Result Value Ref Range Status   Specimen Description THROAT  Final   Special Requests NONE  Final    Culture   Final    No Beta Hemolytic Streptococci Isolated Performed at [facility]   Report Status 03/01/2014 FINAL  Final  Culture, blood (routine x 2) Call MD if unable to obtain prior to antibiotics being given     Status: None   Collection Time: 02/28/14  8:10 PM  Result Value Ref Range Status   Specimen Description BLOOD RIGHT HAND  Final   Special Requests BOTTLES DRAWN AEROBIC ONLY 4CC  Final   Culture  Setup Time   Final    03/01/2014 00:46 Performed at [place]   Final    NO GROWTH 5 DAYS Performed at [facility]    Report Status 03/07/2014 FINAL  Final  Culture, blood (routine x 2) Call MD if unable to obtain prior to antibiotics being given     Status: None   Collection Time: 02/28/14  8:40 PM  Result Value Ref Range Status   Specimen Description BLOOD RIGHT WRIST  Final   Special Requests BOTTLES DRAWN AEROBIC ONLY 4CC  Final   Culture  Setup Time   Final    03/01/2014 00:46 Performed at [facility]    Culture   Final    NO GROWTH 5 DAYS Performed at [facility]    Report Status 03/07/2014 FINAL  Final  MRSA PCR Screening     Status: None   Collection Time: 03/02/14  5:43 PM  Result Value Ref Range Status   MRSA by PCR NEGATIVE NEGATIVE Final    Comment:        The GeneXpert MRSA Assay (FDA approved for NASAL specimens only), is one component of a comprehensive MRSA colonization surveillance program. It is not intended to diagnose MRSA infection nor to guide or monitor treatment for MRSA infections.   Studies/Results: Ct Abdomen Pelvis Wo Contrast  03/07/2014   CLINICAL DATA:  Severe abdominal pain with nausea and vomiting.  EXAM: CT ABDOMEN AND PELVIS WITHOUT CONTRAST  TECHNIQUE: Multidetector CT imaging of the abdomen and pelvis was performed following the standard protocol without IV contrast.  COMPARISON:  None.  FINDINGS: There appears to be a trace pleural fluid bilaterally. Small hiatal  hernia. No evidence for free air.  Scattered areas of high density in the subcutaneous tissue probably related to injection sites. Unenhanced CT was performed per clinician order. Lack of IV contrast limits sensitivity and specificity, especially for evaluation of abdominal/pelvic solid viscera.  No gross abnormality to the liver, gallbladder, spleen, adrenal glands or right kidney. Normal appearance of the bladder. There is  mild fullness of the left renal collecting system and there is concern for a lesion along the left kidney lower pole on sequence 2, image 31. This area roughly measures 1.9 cm. There is no left perinephric edema. There is no gross abnormality to the pancreas. No significant free fluid or lymphadenopathy.  Trace amount of free fluid in the pelvis. Uterus has been removed and ovaries are not confidently identified. Normal appearance of the colon and appendix. There is minimal stranding in the upper abdominal mesentery which is nonspecific. No evidence for bowel obstruction. No acute bone abnormality.  IMPRESSION: Concern for a lesion in the left kidney lower pole, roughly measuring 1.9 cm. This could represent a neoplastic or inflammatory process. There is no significant inflammatory changes around the left kidney. Patient has a contrast allergy but she may be able to get gadolinium for an MRI study. Recommend a pre and post contrast MRI for further evaluation of the left kidney lesion.  Trace amount of fluid in the pelvis of unknown etiology. Unclear if both ovaries have been removed. Uterus has been removed.  No evidence for acute bowel inflammation. There is mild edema in the upper abdominal mesentery which is nonspecific.  Trace amount of pleural fluid versus pleural thickening.  Small hiatal hernia.   Electronically Signed   By: Markus Daft M.D.   On: 03/07/2014 20:16   Medications: I have reviewed the patient's current medications. Scheduled Meds: . desmopressin  1 drop Nasal BID  .  enoxaparin (LOVENOX) injection  40 mg Subcutaneous Q24H  . gabapentin  300 mg Oral TID  . hydrocortisone sod succinate (SOLU-CORTEF) inj  20 mg Intravenous QPM  . hydrocortisone sod succinate (SOLU-CORTEF) inj  40 mg Intravenous q morning - 10a  . lacosamide (VIMPAT) IV  100 mg Intravenous Q12H  . levETIRAcetam  1,500 mg Intravenous Q12H  . levothyroxine  50 mcg Intravenous Daily  . sodium chloride  3 mL Intravenous Q12H   Continuous Infusions:   PRN Meds:.HYDROcodone-acetaminophen, HYDROmorphone, LORazepam, promethazine, tiZANidine Assessment/Plan: Active Problems:   Seizure   Intractable nausea and vomiting  Ms. Paulsen is a 53 year old woman with multiple medical co-morbidities hospitalized for intractable nausea and vomiting, now improving, and found to have L kidney lesion.  Intractable nausea and vomiting: Markedly improved today on Ativan and Dilaudid. Psychiatric disorder possible given her current presentation which is consistent with prior ones. Biliary process less likely though will r/o with RUQ [place] today. Lipase improved as well.  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 fT4 & AM cortisol pending. -Continue IV hydrocortisone 40mg  qAM & 20mg  qPM. -Give 19mcg synthroid IV per Pharmacy recs -Continue ddAVP nasal spray; per RN, she refused to take this medication though encouraged RN to tell her it's for her Addison's  Odynophagia: Unclear etiology. Swallow evaluation at prior hospitalization was unremarkable. -Continue assessing  Left kidney lesion: Correlates with prior study done 6 years ago and is less likely the cause of her initial presentation. -Continue to monitor and consider urology follow-up and follow-up MRI as outpatient  #FEN:  -Diet: NPO  #DVT prophylaxis:  SCDs  #CODE STATUS: FULL CODE  Dispo: Disposition is deferred at this time, awaiting improvement of current medical problems.   -Will let RN know that I will be unable to speak to Ms. Dub Mikes today  The patient  does have a current PCP (Daneil Dan, MD) and does not need an [facility] hospital follow-up appointment after discharge.  The patient does have transportation limitations that hinder transportation to clinic appointments.  .Services Needed at time of discharge: Y = Yes, Blank = No PT:   OT:   RN:   Equipment:   Other:     LOS: 2 days   Charlott Rakes, MD 03/09/2014, 6:49 AM

## 2014-03-09 NOTE — Plan of Care (Signed)
Problem: Phase I Progression Outcomes Goal: Seizure activity controlled Outcome: Completed/Met Date Met:  03/09/14 Goal: IV access obtained Outcome: Completed/Met Date Met:  03/09/14 Goal: Maintaining airway and VS stable Outcome: Completed/Met Date Met:  03/09/14 Goal: Pain controlled with appropriate interventions Outcome: Completed/Met Date Met:  03/09/14 Goal: Voiding-avoid urinary catheter unless indicated Outcome: Completed/Met Date Met:  03/09/14 Goal: Hemodynamically stable Outcome: Completed/Met Date Met:  03/09/14  Problem: Phase II Progression Outcomes Goal: Pain controlled Outcome: Completed/Met Date Met:  03/09/14 Goal: IV converted to [facility] or NSL Outcome: Completed/Met Date Met:  03/09/14

## 2014-03-09 NOTE — Progress Notes (Signed)
Patient ID: Shelly Le, female   DOB: 14-Apr-1961, 53 y.o.   MRN: [id number] Medicine attending: I personally interviewed and examined this patient this morning together with resident physician Dr. Charlott Rakes and I concur with his evaluation and management plan. We appreciate surgery input and review of outside records. 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 She does not currently have a surgical abdomen. Recommendation was an ultrasound of the gallbladder to assess her for cholecystitis. The study was done earlier today and does not show any gallstones or thickening of the gallbladder wall. No pericholecystic fluid. No intra-or extrahepatic biliary duct dilatation. Physicians at [name] felt that her abdominal pain was functional. They initiated a program of pelvic floor physical therapy which did give her some initial relief. Her symptoms have subsided somewhat with the combined use of lorazepam and Phenergan although she is still having some intermittent vomiting and is not able to eat solid food. She states that she cannot drink fluids either. Her son was here this morning and we reviewed current status and management. We will go ahead and get an elective MRI for further evaluation of a lesion seen in her kidney although this is certainly not related to her current symptoms.

## 2014-03-10 LAB — BASIC METABOLIC PANEL
Anion gap: 14 (ref 5–15)
BUN: 6 mg/dL (ref 6–23)
CALCIUM: 8.7 mg/dL (ref 8.4–10.5)
CO2: 23 meq/L (ref 19–32)
Chloride: 105 mEq/L (ref 96–112)
Creatinine, Ser: 0.87 mg/dL (ref 0.50–1.10)
GFR calc Af Amer: 86 mL/min — ABNORMAL LOW (ref >90)
GFR calc non Af Amer: 74 mL/min — ABNORMAL LOW (ref >90)
Glucose, Bld: 82 mg/dL (ref 70–99)
POTASSIUM: 3.7 meq/L (ref 3.7–5.3)
Sodium: 142 mEq/L (ref 137–147)

## 2014-03-10 LAB — CORTISOL-AM, BLOOD: Cortisol - AM: 5 ug/dL (ref 4.3–22.4)

## 2014-03-10 LAB — GLUCOSE, CAPILLARY: Glucose-Capillary: 83 mg/dL (ref 70–99)

## 2014-03-10 NOTE — Progress Notes (Signed)
Patient ID: Shelly Le, female   DOB: Apr 20, 1961, 53 y.o.   MRN: [id number] Medicine attending: I personally interviewed and examined this patient today together with resident physician Dr. Charlott Rakes and I concur with his evaluation and management plan. The patient is much calmer. Her abdomen remains soft and minimally tender. She is moving her bowels. She continues to contend that she cannot keep down liquids or solids. There appears to be a strong psychological overlay to her symptoms. We are going to go ahead and get an MRI of her abdomen for further delineation of a right renal lesion.

## 2014-03-10 NOTE — Progress Notes (Addendum)
Subjective: Her diet was advanced to Liquids yesterday. RUQ [place] was unremarkable for stones.   This AM, she reports only tolerating ice chips. She does not feel she is able to tolerate liquids. We reiterated to her that she needs to try advancing her diet. We will also schedule repeat imaging of the kidney.   Objective: Vital signs in last 24 hours: Filed Vitals:   03/09/14 1824 03/09/14 2139 03/10/14 0304 03/10/14 0600  BP: 111/60 111/53 121/66 121/54  Pulse: 65 68 58 56  Temp: 99 F (37.2 C) 99.1 F (37.3 C) 98.4 F (36.9 C) 98.8 F (37.1 C)  TempSrc: Oral Oral Oral Oral  Resp: 20 20 18 18   Height:      Weight:      SpO2: 100% 95% 100% 98%   Weight change:  No intake or output data in the 24 hours ending 03/10/14 0747  General: resting in bed comfortably HEENT: PERRL, EOMI, no scleral icterus Cardiac: RRR, no rubs, murmurs or gallops Pulm: clear to auscultation bilaterally, no wheezes, rales, or rhonchi Abd: soft, non-distended, bowel sounds present in all four quadrants without tenderness when pressure is applied to the stethoscope during auscultation Ext: warm and well perfused, no pedal edema Neuro: alert and oriented X3,strength and sensation to light touch equal in bilateral upper and lower extremities, spontaneously moving all extremities   Lab Results: Basic Metabolic Panel:  Recent Labs Lab 03/07/14 2035  03/09/14 0524 03/10/14 0549  NA  --   < > 148* 142  K  --   < > 3.6* 3.7  CL  --   < > 111 105  CO2  --   < > 22 23  GLUCOSE  --   < > 79 82  BUN  --   < > 6 6  CREATININE  --   < > 0.91 0.87  CALCIUM  --   < > 8.7 8.7  MG 2.2  --   --   --   < > = values in this interval not displayed. Liver Function Tests:  Recent Labs Lab 03/07/14 2035  AST 15  ALT 22  ALKPHOS 72  BILITOT 0.2*  PROT 6.9  ALBUMIN 3.7    Recent Labs Lab 03/07/14 2035 03/09/14 0930  LIPASE 62* 57   CBC:  Recent Labs Lab 03/07/14 1551 03/08/14 0835  03/09/14 0524  WBC 8.2 11.4* 9.9  NEUTROABS 5.6  --   --   HGB 13.6 10.4* 9.9*  HCT 39.6 30.9* 29.7*  MCV 78.3 80.7 80.3  PLT 262 246 241   Cardiac Enzymes:  Recent Labs Lab 03/05/14 2153 03/06/14 0253 03/06/14 1428  TROPONINI <0.30 <0.30 <0.30   Urine Drug Screen: Drugs of Abuse     Component Value Date/Time   LABOPIA NONE DETECTED 03/07/2014 1726   COCAINSCRNUR NONE DETECTED 03/07/2014 1726   LABBENZ POSITIVE* 03/07/2014 1726   AMPHETMU NONE DETECTED 03/07/2014 1726   THCU NONE DETECTED 03/07/2014 1726   LABBARB NONE DETECTED 03/07/2014 1726    Alcohol Level:  Recent Labs Lab 03/07/14 1551  ETH <11   Urinalysis:  Recent Labs Lab 03/07/14 1726  COLORURINE YELLOW  LABSPEC 1.007  PHURINE 8.0  GLUCOSEU NEGATIVE  HGBUR SMALL*  BILIRUBINUR NEGATIVE  KETONESUR NEGATIVE  PROTEINUR NEGATIVE  UROBILINOGEN 0.2  NITRITE NEGATIVE  LEUKOCYTESUR TRACE*   Micro Results: Recent Results (from the past 240 hour(s))  Culture, blood (routine x 2) Call MD if unable to obtain prior to antibiotics being  given     Status: None   Collection Time: 02/28/14  8:10 PM  Result Value Ref Range Status   Specimen Description BLOOD RIGHT HAND  Final   Special Requests BOTTLES DRAWN AEROBIC ONLY 4CC  Final   Culture  Setup Time   Final    03/01/2014 00:46 Performed at [place]   Final    NO GROWTH 5 DAYS Performed at [facility]    Report Status 03/07/2014 FINAL  Final  Culture, blood (routine x 2) Call MD if unable to obtain prior to antibiotics being given     Status: None   Collection Time: 02/28/14  8:40 PM  Result Value Ref Range Status   Specimen Description BLOOD RIGHT WRIST  Final   Special Requests BOTTLES DRAWN AEROBIC ONLY 4CC  Final   Culture  Setup Time   Final    03/01/2014 00:46 Performed at [facility]    Culture   Final    NO GROWTH 5 DAYS Performed at [facility]    Report Status 03/07/2014 FINAL   Final  MRSA PCR Screening     Status: None   Collection Time: 03/02/14  5:43 PM  Result Value Ref Range Status   MRSA by PCR NEGATIVE NEGATIVE Final    Comment:        The GeneXpert MRSA Assay (FDA approved for NASAL specimens only), is one component of a comprehensive MRSA colonization surveillance program. It is not intended to diagnose MRSA infection nor to guide or monitor treatment for MRSA infections.   Studies/Results: US Abdomen Limited Ruq  03/09/2014   CLINICAL DATA:  Upper abdominal pain  EXAM: US ABDOMEN LIMITED - RIGHT UPPER QUADRANT  COMPARISON:  CT abdomen and pelvis March 07, 2014  FINDINGS: Gallbladder:  There is rather minimal sludge in the gallbladder. No gallstones or wall thickening visualized. There is no pericholecystic fluid. No sonographic Murphy sign noted.  Common bile duct:  Diameter: 3 mm. There is no intrahepatic or extrahepatic biliary duct dilatation.  Liver:  No focal lesion identified. Within normal limits in parenchymal echogenicity.  There is a small right pleural effusion.  IMPRESSION: Rather minimal sludge in gallbladder. Gallbladder otherwise appears normal. Small right pleural effusion. Study otherwise unremarkable.   Electronically Signed   By: Lowella Grip M.D.   On: 03/09/2014 15:29   Medications: I have reviewed the patient's current medications. Scheduled Meds: . desmopressin  1 drop Nasal BID  . enoxaparin (LOVENOX) injection  40 mg Subcutaneous Q24H  . gabapentin  300 mg Oral TID  . hydrocortisone sod succinate (SOLU-CORTEF) inj  20 mg Intravenous QPM  . hydrocortisone sod succinate (SOLU-CORTEF) inj  40 mg Intravenous q morning - 10a  . lacosamide (VIMPAT) IV  100 mg Intravenous Q12H  . levETIRAcetam  1,500 mg Intravenous Q12H  . levothyroxine  50 mcg Intravenous Daily  . sodium chloride  3 mL Intravenous Q12H   Continuous Infusions: . 0.9 % NaCl with KCl 20 mEq / L 125 mL/hr at 03/10/14 0123   PRN  Meds:.HYDROcodone-acetaminophen, HYDROmorphone, LORazepam, promethazine, tiZANidine Assessment/Plan: Active Problems:   Seizure   Intractable nausea and vomiting   Abdominal pain  Ms. Sheahan is a 53 year old woman with multiple medical co-morbidities hospitalized for intractable nausea and vomiting, now improving, and found to have L kidney lesion.  Intractable nausea and vomiting: Required less doses of  Ativan and Dilaudid yesterday. RUQ US unremarkable for a biliary process.  Psychiatric disorder is suspect and should be further investigated perhaps in the outpatient setting. 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 fT4 1.27 yesterday,  & AM cortisol 5.0.  -Continue IV hydrocortisone 40mg  qAM & 20mg  qPM. -Continue 84mcg synthroid IV per Pharmacy recs -Continue ddAVP nasal spray  Odynophagia: Unclear etiology. Swallow evaluation at prior hospitalization was unremarkable. -Continue assessing  Left kidney lesion: Recent imaging correlates with prior study done 6 years ago and is less likely the cause of her initial presentation. -Per Radiology, inpatient MR studies are not routinely recommended -MRI abdomen pending  #FEN:  -Diet: Clears  #DVT prophylaxis: Lovenox  #CODE STATUS: FULL CODE  Dispo: Disposition is deferred at this time, awaiting improvement of current medical problems.    The patient does have a current PCP (Daneil Dan, MD) and does not need an [facility] hospital follow-up appointment after discharge.  The patient does have transportation limitations that hinder transportation to clinic appointments.  .Services Needed at time of discharge: Y = Yes, Blank = No PT:   OT:   RN:   Equipment:   Other:     LOS: 3 days   Charlott Rakes, MD 03/10/2014, 7:47 AM

## 2014-03-10 NOTE — Progress Notes (Signed)
[facility] Progress Note     Subjective: Pt doing much better.  She says she "tried ginger ale again and it all came up".  The nurses believe she is making herself vomit and spit up phlegm.  She uses tissues to in the bottom of the bags to make it look like there's more output.    Objective: Vital signs in last 24 hours: Temp:  [98.4 F (36.9 C)-99.1 F (37.3 C)] 98.8 F (37.1 C) (11/07 0600) Pulse Rate:  [56-74] 56 (11/07 0600) Resp:  [16-20] 18 (11/07 0600) BP: (111-138)/(53-66) 121/54 mmHg (11/07 0600) SpO2:  [95 %-100 %] 98 % (11/07 0600) Last BM Date: 03/07/14  Intake/Output from previous day:   Intake/Output this shift:    PE: Gen:  Alert, NAD, pleasant Abd: Soft, ND, completely non-tender (WITHOUT DISTRACTED EXAM), +BS, no HSM    Lab Results:   Recent Labs  03/08/14 0835 03/09/14 0524  WBC 11.4* 9.9  HGB 10.4* 9.9*  HCT 30.9* 29.7*  PLT 246 241   BMET  Recent Labs  03/09/14 0524 03/10/14 0549  NA 148* 142  K 3.6* 3.7  CL 111 105  CO2 22 23  GLUCOSE 79 82  BUN 6 6  CREATININE 0.91 0.87  CALCIUM 8.7 8.7   PT/INR No results for input(s): LABPROT, INR in the last 72 hours. CMP     Component Value Date/Time   NA 142 03/10/2014 0549   K 3.7 03/10/2014 0549   CL 105 03/10/2014 0549   CO2 23 03/10/2014 0549   GLUCOSE 82 03/10/2014 0549   BUN 6 03/10/2014 0549   CREATININE 0.87 03/10/2014 0549   CALCIUM 8.7 03/10/2014 0549   PROT 6.9 03/07/2014 2035   ALBUMIN 3.7 03/07/2014 2035   AST 15 03/07/2014 2035   ALT 22 03/07/2014 2035   ALKPHOS 72 03/07/2014 2035   BILITOT 0.2* 03/07/2014 2035   GFRNONAA 74* 03/10/2014 0549   GFRAA 86* 03/10/2014 0549   Lipase     Component Value Date/Time   LIPASE 57 03/09/2014 0930       Studies/Results: US Abdomen Limited Ruq  03/09/2014   CLINICAL DATA:  Upper abdominal pain  EXAM: US ABDOMEN LIMITED - RIGHT UPPER QUADRANT  COMPARISON:  CT abdomen and pelvis March 07, 2014  FINDINGS:  Gallbladder:  There is rather minimal sludge in the gallbladder. No gallstones or wall thickening visualized. There is no pericholecystic fluid. No sonographic Murphy sign noted.  Common bile duct:  Diameter: 3 mm. There is no intrahepatic or extrahepatic biliary duct dilatation.  Liver:  No focal lesion identified. Within normal limits in parenchymal echogenicity.  There is a small right pleural effusion.  IMPRESSION: Rather minimal sludge in gallbladder. Gallbladder otherwise appears normal. Small right pleural effusion. Study otherwise unremarkable.   Electronically Signed   By: Lowella Grip M.D.   On: 03/09/2014 15:29    Anti-infectives: Anti-infectives    Start     Dose/Rate Route Frequency Ordered Stop   03/09/14 0100  cefTRIAXone (ROCEPHIN) 1 g in dextrose 5 % 50 mL IVPB     1 g100 mL/hr over 30 Minutes Intravenous Every 24 hours 03/08/14 1721 03/09/14 0207   03/08/14 2200  cefTRIAXone (ROCEPHIN) 1 g in dextrose 5 % 50 mL IVPB  Status:  Discontinued     1 g100 mL/hr over 30 Minutes Intravenous Every 24 hours 03/08/14 0802 03/08/14 1721   03/07/14 2100  cefTRIAXone (ROCEPHIN) 1 g in dextrose 5 % 50 mL IVPB  Status:  Discontinued     1 g100 mL/hr over 30 Minutes Intravenous Every 24 hours 03/07/14 2034 03/08/14 0802       Assessment/Plan RUQ, RLQ and epigastric abdominal pain Subjective nausea & vomiting - not witnessed Edema to the upper abdominal mesentary Leukocytosis Mildly elevated lipase Left kidney lesion  Plan: 1. Her LFT's are normal, her WBC is now normal. Her pain is objectively improved, but says her pain is still there. Her subjective symptoms of frequent large amounts of nausea and vomiting have not been witnessed by providers, nurses, or techs. She does not have an acute abdomen. Her abdomen is soft and minimally tender with distracted exam. Her subjective pain is out of proportion to her exam. She had a non-contrasted CT scan because she says she's allergic,  but then on further questioning, she has done well with contrast dye "when it was pushed slowly into the veins" 2. Advance diet as tolerated, may have to be on a liquid/pureed diet at home to minimize symptoms.  Maximize non-sedating antiemetics. 3. Ultrasound shows minimal sludge, but otherwise completely normal, no stones, her LFT's are normal and her lipase is normal as well.  Don't see need in repeating CT with contrast for appendicitis or performing HIDA at this time.  I have re-iterated to her that she does not have a surgical problem and that we believe her appendix and gallbladder is normal. 4. Recommend conservative measures and medical management. Given the inconsistencies in her history/physical exam she may warrant a psych workup for conversion, malingering, somatization at some point. This patient needs continuity of care, she seems to get frequent care at [facility] which is where most of her specialist are. 5. No further recommendations, will sign off.  This problems is non-surgical and seems to be functional.  She is seeing specialist at [name] and she should continue to keep continuity there.    LOS: 3 days    Coralie Keens 03/10/2014, 8:32 AM Pager: [phone]

## 2014-03-11 ENCOUNTER — Inpatient Hospital Stay (HOSPITAL_COMMUNITY): Payer: Medicare Other

## 2014-03-11 DIAGNOSIS — N281 Cyst of kidney, acquired: Secondary | ICD-10-CM

## 2014-03-11 DIAGNOSIS — R109 Unspecified abdominal pain: Secondary | ICD-10-CM

## 2014-03-11 LAB — BASIC METABOLIC PANEL
ANION GAP: 14 (ref 5–15)
BUN: 6 mg/dL (ref 6–23)
CHLORIDE: 105 meq/L (ref 96–112)
CO2: 19 meq/L (ref 19–32)
CREATININE: 0.78 mg/dL (ref 0.50–1.10)
Calcium: 8.6 mg/dL (ref 8.4–10.5)
GFR calc Af Amer: >90 mL/min (ref >90)
GFR calc non Af Amer: >90 mL/min (ref >90)
GLUCOSE: 77 mg/dL (ref 70–99)
Potassium: 4.3 mEq/L (ref 3.7–5.3)
Sodium: 138 mEq/L (ref 137–147)

## 2014-03-11 MED ORDER — GADOBENATE DIMEGLUMINE 529 MG/ML IV SOLN
15.0000 mL | Freq: Once | INTRAVENOUS | Status: AC | PRN
  Administered 2014-03-11: 15 mL via INTRAVENOUS

## 2014-03-11 MED ORDER — HYDROCORTISONE 10 MG PO TABS
10.0000 mg | ORAL_TABLET | Freq: Every day | ORAL | Status: DC
  Administered 2014-03-11 – 2014-03-13 (×3): 10 mg via ORAL
  Filled 2014-03-11 (×3): qty 1

## 2014-03-11 MED ORDER — HYDROCORTISONE 20 MG PO TABS
20.0000 mg | ORAL_TABLET | Freq: Every day | ORAL | Status: DC
  Administered 2014-03-11 – 2014-03-13 (×3): 20 mg via ORAL
  Filled 2014-03-11 (×3): qty 1

## 2014-03-11 MED ORDER — LEVETIRACETAM 750 MG PO TABS
1500.0000 mg | ORAL_TABLET | Freq: Two times a day (BID) | ORAL | Status: DC
  Administered 2014-03-12 – 2014-03-13 (×3): 1500 mg via ORAL
  Filled 2014-03-11 (×4): qty 2

## 2014-03-11 MED ORDER — LACOSAMIDE 50 MG PO TABS
100.0000 mg | ORAL_TABLET | Freq: Two times a day (BID) | ORAL | Status: DC
  Administered 2014-03-11 – 2014-03-13 (×5): 100 mg via ORAL
  Filled 2014-03-11 (×5): qty 2

## 2014-03-11 MED ORDER — LEVETIRACETAM 750 MG PO TABS
1500.0000 mg | ORAL_TABLET | Freq: Once | ORAL | Status: AC
  Administered 2014-03-11: 1500 mg via ORAL
  Filled 2014-03-11: qty 2

## 2014-03-11 MED ORDER — PROMETHAZINE HCL 25 MG PO TABS
25.0000 mg | ORAL_TABLET | Freq: Four times a day (QID) | ORAL | Status: DC | PRN
  Administered 2014-03-11 – 2014-03-13 (×7): 25 mg via ORAL
  Filled 2014-03-11 (×7): qty 1

## 2014-03-11 MED ORDER — LORAZEPAM 1 MG PO TABS
1.0000 mg | ORAL_TABLET | Freq: Four times a day (QID) | ORAL | Status: DC | PRN
  Administered 2014-03-11 – 2014-03-13 (×7): 1 mg via ORAL
  Filled 2014-03-11 (×7): qty 1

## 2014-03-11 MED ORDER — LEVOTHYROXINE SODIUM 100 MCG PO TABS
100.0000 ug | ORAL_TABLET | Freq: Every day | ORAL | Status: DC
  Administered 2014-03-11 – 2014-03-13 (×3): 100 ug via ORAL
  Filled 2014-03-11 (×3): qty 1

## 2014-03-11 NOTE — Progress Notes (Signed)
Patient ID: Shelly Le, female   DOB: 1960-09-28, 53 y.o.   MRN: [id number] Medicine attending: She appears to be slowly improving. Nurses report that she ate full meals yesterday. Patient still states that she cannot keep anything down. Her abdomen is soft, nontender. MRI of the abdomen with contrast shows that the questionable lesion in her left renal sinus is a benign appearing hemorrhagic cyst. Right kidney normal. Liver normal. Gallbladder with some layering sludge. Pancreas, spleen, adrenals, normal. No abdominal adenopathy or ascites. I encouraged the patient to go back on her oral medications at this time. Nurses also report that she has been taking her Tegretol without any difficulty.

## 2014-03-11 NOTE — Progress Notes (Signed)
Subjective: Per RN, she has been able to tolerate a Heart Healthy diet and has had no issues with nausea or vomiting yesterday.   This AM, she reports she is still struggling with her PO intake but reports feeling better overall. She feels Ativan and Phenergan were particularly helpful.   Objective: Vital signs in last 24 hours: Filed Vitals:   03/10/14 1744 03/10/14 2102 03/11/14 0118 03/11/14 0542  BP: 108/63 114/67 106/57 105/50  Pulse: 90 84 81 76  Temp: 98.9 F (37.2 C) 98.7 F (37.1 C) 98.5 F (36.9 C) 98.5 F (36.9 C)  TempSrc: Oral Oral Oral Oral  Resp: 18 20 17 18   Height:      Weight:      SpO2: 100% 98% 99% 98%   Weight change:  No intake or output data in the 24 hours ending 03/11/14 1153  General: resting in bed comfortably HEENT: PERRL, EOMI, no scleral icterus Cardiac: RRR, no rubs, murmurs or gallops Pulm: clear to auscultation bilaterally, no wheezes, rales, or rhonchi Abd: soft, non-distended, bowel sounds present in all four quadrants Ext: warm and well perfused, no pedal edema Neuro: responds to questions appropriately; moving all extremities freely   Lab Results: Basic Metabolic Panel:  Recent Labs Lab 03/07/14 2035  03/10/14 0549 03/11/14 0615  NA  --   < > 142 138  K  --   < > 3.7 4.3  CL  --   < > 105 105  CO2  --   < > 23 19  GLUCOSE  --   < > 82 77  BUN  --   < > 6 6  CREATININE  --   < > 0.87 0.78  CALCIUM  --   < > 8.7 8.6  MG 2.2  --   --   --   < > = values in this interval not displayed. Liver Function Tests:  Recent Labs Lab 03/07/14 2035  AST 15  ALT 22  ALKPHOS 72  BILITOT 0.2*  PROT 6.9  ALBUMIN 3.7    Recent Labs Lab 03/07/14 2035 03/09/14 0930  LIPASE 62* 57   CBC:  Recent Labs Lab 03/07/14 1551 03/08/14 0835 03/09/14 0524  WBC 8.2 11.4* 9.9  NEUTROABS 5.6  --   --   HGB 13.6 10.4* 9.9*  HCT 39.6 30.9* 29.7*  MCV 78.3 80.7 80.3  PLT 262 246 241   Cardiac Enzymes:  Recent Labs Lab  03/05/14 2153 03/06/14 0253 03/06/14 1428  TROPONINI <0.30 <0.30 <0.30   Urine Drug Screen: Drugs of Abuse     Component Value Date/Time   LABOPIA NONE DETECTED 03/07/2014 1726   COCAINSCRNUR NONE DETECTED 03/07/2014 1726   LABBENZ POSITIVE* 03/07/2014 1726   AMPHETMU NONE DETECTED 03/07/2014 1726   THCU NONE DETECTED 03/07/2014 1726   LABBARB NONE DETECTED 03/07/2014 1726    Alcohol Level:  Recent Labs Lab 03/07/14 1551  ETH <11   Urinalysis:  Recent Labs Lab 03/07/14 1726  COLORURINE YELLOW  LABSPEC 1.007  PHURINE 8.0  GLUCOSEU NEGATIVE  HGBUR SMALL*  BILIRUBINUR NEGATIVE  KETONESUR NEGATIVE  PROTEINUR NEGATIVE  UROBILINOGEN 0.2  NITRITE NEGATIVE  LEUKOCYTESUR TRACE*   Micro Results: Recent Results (from the past 240 hour(s))  MRSA PCR Screening     Status: None   Collection Time: 03/02/14  5:43 PM  Result Value Ref Range Status   MRSA by PCR NEGATIVE NEGATIVE Final    Comment:        The  GeneXpert MRSA Assay (FDA approved for NASAL specimens only), is one component of a comprehensive MRSA colonization surveillance program. It is not intended to diagnose MRSA infection nor to guide or monitor treatment for MRSA infections.   Studies/Results: US Abdomen Limited Ruq  03/09/2014   CLINICAL DATA:  Upper abdominal pain  EXAM: US ABDOMEN LIMITED - RIGHT UPPER QUADRANT  COMPARISON:  CT abdomen and pelvis March 07, 2014  FINDINGS: Gallbladder:  There is rather minimal sludge in the gallbladder. No gallstones or wall thickening visualized. There is no pericholecystic fluid. No sonographic Murphy sign noted.  Common bile duct:  Diameter: 3 mm. There is no intrahepatic or extrahepatic biliary duct dilatation.  Liver:  No focal lesion identified. Within normal limits in parenchymal echogenicity.  There is a small right pleural effusion.  IMPRESSION: Rather minimal sludge in gallbladder. Gallbladder otherwise appears normal. Small right pleural effusion. Study  otherwise unremarkable.   Electronically Signed   By: Lowella Grip M.D.   On: 03/09/2014 15:29   Medications: I have reviewed the patient's current medications. Scheduled Meds: . desmopressin  1 drop Nasal BID  . enoxaparin (LOVENOX) injection  40 mg Subcutaneous Q24H  . gabapentin  300 mg Oral TID  . hydrocortisone  10 mg Oral Daily  . hydrocortisone  20 mg Oral Daily  . lacosamide  100 mg Oral BID  . [START ON 03/12/2014] levETIRAcetam  1,500 mg Oral BID  . levETIRAcetam  1,500 mg Oral Once  . levothyroxine  100 mcg Oral QAC breakfast  . sodium chloride  3 mL Intravenous Q12H   Continuous Infusions: . 0.9 % NaCl with KCl 20 mEq / L 1,000 mL (03/11/14 0436)   PRN Meds:.HYDROcodone-acetaminophen, HYDROmorphone, LORazepam, promethazine, tiZANidine Assessment/Plan: Active Problems:   Seizure   Intractable nausea and vomiting   Abdominal pain  Ms. Butrum is a 53 year old woman with multiple medical co-morbidities hospitalized for intractable nausea and vomiting, now improving, and found to have L kidney lesion.  Intractable nausea and vomiting: Workup for GI pathology has been unremarkable here and at other institutions as well. Psychiatric disorder is suspect though unclear if there's primary vs secondary gain given her prior history of psychosocial trauma. She is tolerating PO intake and her medications will be converted accordingly. 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  -Continue Cortef 20mg  qAM & 10mg  qPM -Continue home Synthroid 143mcg -Continue ddAVP nasal spray  Odynophagia: Unclear etiology. Swallow evaluation at prior hospitalization was unremarkable. It appears improved though is inconsistent.   Left kidney lesion: Recent imaging correlates with prior study done 6 years ago and is less likely the cause of her initial presentation. -MRI abdomen pending  #FEN:   -Diet: Heart Healthy  #DVT prophylaxis: Lovenox  #CODE STATUS: FULL CODE  Dispo: Disposition is deferred at this time, awaiting improvement of current medical problems.    The patient does have a current PCP (Daneil Dan, MD) and does not need an [facility] hospital follow-up appointment after discharge.  The patient does have transportation limitations that hinder transportation to clinic appointments.  .Services Needed at time of discharge: Y = Yes, Blank = No PT:   OT:   RN:   Equipment:   Other:     LOS: 4 days   Charlott Rakes, MD 03/11/2014, 11:53 AM

## 2014-03-11 NOTE — Progress Notes (Signed)
PT Cancellation Note  Patient Details Name: Shelly Le MRN: [id number] DOB: 10/20/1960   Cancelled Treatment:    Reason Eval/Treat Not Completed: Patient declined, no reason specified  Patient declined PT eval as she had company in the room "There is no need to make me look bad while I have company" Although her family encouraged her to participate, she replied "I am the patient here and I get to make the decisions.    [name] 03/11/2014, 3:59 PM  [name], [place]

## 2014-03-11 NOTE — Progress Notes (Signed)
Pt. Stated that [place] doesn't help her pain and prefers IV Dilaudid.  MD paged.  MD stated that the series of tests conducted has not revealed the source of her pain.  At this time, IV Dilaudid will not be ordered and wants patient to try Norco.  This RN accompanied by another RN informed patient what the MD had decided.  Patient refused to try Norco and preferred Zanaflex.  Patient was upset that her IV ativan and phenergan had been switched to PO.  Patient was upset and commented that she was ready to go home.  Will continue to monitor patient.

## 2014-03-12 DIAGNOSIS — N281 Cyst of kidney, acquired: Secondary | ICD-10-CM

## 2014-03-12 LAB — BASIC METABOLIC PANEL
ANION GAP: 10 (ref 5–15)
BUN: 4 mg/dL — ABNORMAL LOW (ref 6–23)
CHLORIDE: 106 meq/L (ref 96–112)
CO2: 24 meq/L (ref 19–32)
CREATININE: 0.74 mg/dL (ref 0.50–1.10)
Calcium: 8.6 mg/dL (ref 8.4–10.5)
GFR calc Af Amer: >90 mL/min (ref >90)
GFR calc non Af Amer: >90 mL/min (ref >90)
GLUCOSE: 98 mg/dL (ref 70–99)
Potassium: 3.9 mEq/L (ref 3.7–5.3)
Sodium: 140 mEq/L (ref 137–147)

## 2014-03-12 LAB — GLUCOSE, CAPILLARY: Glucose-Capillary: 103 mg/dL — ABNORMAL HIGH (ref 70–99)
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
## 2014-03-12 NOTE — Consult Note (Signed)
[facility] Face-to-Face Psychiatry Consult   Reason for Consult:  Somatic symptoms Referring Physician:  Dr. Dimple Casey is an 53 y.o. female. Total Time spent with patient: 45 minutes  Assessment: AXIS I:  Adjustment Disorder with Mixed Emotional Features AXIS II:  Deferred AXIS III:   Past Medical History  Diagnosis Date  . Addison's disease   . Diabetes insipidus   . Pneumonia   . Seizures   . Brain tumor    AXIS IV:  other psychosocial or environmental problems, problems related to social environment and problems with primary support group AXIS V:  51-60 moderate symptoms  Plan:  Recommended no medication management at this time Patient will be referred to the outpatient counseling services including family counseling when medically cleared No evidence of imminent risk to self or others at present.   Patient does not meet criteria for psychiatric inpatient admission. Supportive therapy provided about ongoing stressors. Discussed crisis plan, support from social network, calling 911, coming to the Emergency Department, and calling Suicide Hotline.  Appreciate psychiatric consultation Please contact [phone] or [phone] if needs further assistance   Subjective:   Shelly Le is a 53 y.o. female patient admitted with somatization.  HPI:  Patient is seen, chart reviewed for psychiatric consultation and evaluation of multiple somatic symptoms. Patient reported she does not want to see a psychiatrist but reported she already has a psychologist whose she has been talking to and interested to follow-up as an outpatient. Patient reported she has no inpatient psychiatric services are medication management. Patient talks about her relationship with her husband is not going well but she has fairly good relationship with her children who are 66 and 40 years old. Patient endorses symptoms of multiple physical complaints and recent history of seizure and infections. Patient  denies current symptoms of depression, anxiety and psychosis. Patient denies suicidal ideation, intention or plan. Patient has no evidence of homicidal ideation, intentions or plans. Patient has no evidence of psychosis.  HPI Elements:   Location:  multiple physical problems. Quality:  moderate to poor. Severity:  undetermined. Timing:  recent history of seizure and lung infection.  Past Psychiatric History: Past Medical History  Diagnosis Date  . Addison's disease   . Diabetes insipidus   . Pneumonia   . Seizures   . Brain tumor     reports that she has never smoked. She has never used smokeless tobacco. She reports that she does not drink alcohol or use illicit drugs. History reviewed. No pertinent family history.   Living Arrangements: Alone   Abuse/Neglect [name]) Physical Abuse: Denies Verbal Abuse: Denies Sexual Abuse: Denies Allergies:   Allergies  Allergen Reactions  . Contrast Media [Iodinated Diagnostic Agents] Rash and Shortness Of Breath  . Fentanyl Other (See Comments)    Witnessed grand tonic clonic seizure in ED while wearing patch (02/26/14); h/o seizure disorder  . Morphine Hives and Shortness Of Breath  . Shellfish Allergy Anaphylaxis and Hives  . Latex   . Penicillins Nausea And Vomiting    GI Intolerance  . Metrizamide Rash    ACT Assessment Complete:  no Objective: Blood pressure 118/77, pulse 92, temperature 98.2 F (36.8 C), temperature source Oral, resp. rate 16, height 5' 5"  (1.651 m), weight 68.04 kg (150 lb), SpO2 99 %.Body mass index is 24.96 kg/(m^2). Results for orders placed or performed during the hospital encounter of 03/07/14 (from the past 72 hour(s))  Basic metabolic panel     Status:  Abnormal   Collection Time: 03/10/14  5:49 AM  Result Value Ref Range   Sodium 142 137 - 147 mEq/L   Potassium 3.7 3.7 - 5.3 mEq/L   Chloride 105 96 - 112 mEq/L   CO2 23 19 - 32 mEq/L   Glucose, Bld 82 70 - 99 mg/dL   BUN 6 6 - 23 mg/dL   Creatinine,  Ser 0.87 0.50 - 1.10 mg/dL   Calcium 8.7 8.4 - 10.5 mg/dL   GFR calc non Af Amer 74 (L) >90 mL/min   GFR calc Af Amer 86 (L) >90 mL/min    Comment: (NOTE) The eGFR has been calculated using the CKD EPI equation. This calculation has not been validated in all clinical situations. eGFR's persistently <90 mL/min signify possible Chronic Kidney Disease.    Anion gap 14 5 - 15  Glucose, capillary     Status: None   Collection Time: 03/10/14  6:05 AM  Result Value Ref Range   Glucose-Capillary 83 70 - 99 mg/dL   Comment 1 Documented in Chart    Comment 2 Notify RN   Basic metabolic panel     Status: None   Collection Time: 03/11/14  6:15 AM  Result Value Ref Range   Sodium 138 137 - 147 mEq/L   Potassium 4.3 3.7 - 5.3 mEq/L   Chloride 105 96 - 112 mEq/L   CO2 19 19 - 32 mEq/L   Glucose, Bld 77 70 - 99 mg/dL   BUN 6 6 - 23 mg/dL   Creatinine, Ser 0.78 0.50 - 1.10 mg/dL   Calcium 8.6 8.4 - 10.5 mg/dL   GFR calc non Af Amer >90 >90 mL/min   GFR calc Af Amer >90 >90 mL/min    Comment: (NOTE) The eGFR has been calculated using the CKD EPI equation. This calculation has not been validated in all clinical situations. eGFR's persistently <90 mL/min signify possible Chronic Kidney Disease.    Anion gap 14 5 - 15  Basic metabolic panel     Status: Abnormal   Collection Time: 03/12/14  4:17 AM  Result Value Ref Range   Sodium 140 137 - 147 mEq/L   Potassium 3.9 3.7 - 5.3 mEq/L   Chloride 106 96 - 112 mEq/L   CO2 24 19 - 32 mEq/L   Glucose, Bld 98 70 - 99 mg/dL   BUN 4 (L) 6 - 23 mg/dL   Creatinine, Ser 0.74 0.50 - 1.10 mg/dL   Calcium 8.6 8.4 - 10.5 mg/dL   GFR calc non Af Amer >90 >90 mL/min   GFR calc Af Amer >90 >90 mL/min    Comment: (NOTE) The eGFR has been calculated using the CKD EPI equation. This calculation has not been validated in all clinical situations. eGFR's persistently <90 mL/min signify possible Chronic Kidney Disease.    Anion gap 10 5 - 15  Glucose,  capillary     Status: Abnormal   Collection Time: 03/12/14  6:43 AM  Result Value Ref Range   Glucose-Capillary 103 (H) 70 - 99 mg/dL   Comment 1 Documented in Chart    Comment 2 Notify RN    Labs are reviewed.  Current Facility-Administered Medications  Medication Dose Route Frequency Provider Last Rate Last Dose  . 0.9 % NaCl with KCl 20 mEq/ L  infusion   Intravenous Continuous Charlott Rakes, MD 125 mL/hr at 03/12/14 0556    . desmopressin (DDAVP Rhinal tube) 0.01 % nasal solution 1 drop  1 drop  Nasal BID Annia Belt, MD   1 drop at 03/12/14 1143  . enoxaparin (LOVENOX) injection 40 mg  40 mg Subcutaneous Q24H Corky Sox, MD   40 mg at 03/11/14 2215  . gabapentin (NEURONTIN) capsule 300 mg  300 mg Oral TID Corky Sox, MD   300 mg at 03/12/14 1142  . HYDROcodone-acetaminophen (NORCO) 10-325 MG per tablet 1 tablet  1 tablet Oral Q6H PRN Corky Sox, MD      . hydrocortisone (CORTEF) tablet 10 mg  10 mg Oral Daily Charlott Rakes, MD   10 mg at 03/11/14 1650  . hydrocortisone (CORTEF) tablet 20 mg  20 mg Oral Daily Charlott Rakes, MD   20 mg at 03/12/14 1142  . lacosamide (VIMPAT) tablet 100 mg  100 mg Oral BID Charlott Rakes, MD   100 mg at 03/12/14 1142  . levETIRAcetam (KEPPRA) tablet 1,500 mg  1,500 mg Oral BID Charlott Rakes, MD   1,500 mg at 03/12/14 0901  . levothyroxine (SYNTHROID, LEVOTHROID) tablet 100 mcg  100 mcg Oral QAC breakfast Charlott Rakes, MD   100 mcg at 03/12/14 0901  . LORazepam (ATIVAN) tablet 1 mg  1 mg Oral Q6H PRN Charlott Rakes, MD   1 mg at 03/12/14 1151  . promethazine (PHENERGAN) tablet 25 mg  25 mg Oral Q6H PRN Charlott Rakes, MD   25 mg at 03/12/14 1150  . sodium chloride 0.9 % injection 3 mL  3 mL Intravenous Q12H Corky Sox, MD   3 mL at 03/11/14 1202  . tiZANidine (ZANAFLEX) tablet 8 mg  8 mg Oral Q8H PRN Corky Sox, MD   8 mg at 03/12/14 1150    Psychiatric Specialty Exam: Physical [place] per history and physical  Review of Systems  Constitutional:  Positive for malaise/fatigue.  Respiratory: Positive for shortness of breath.   Cardiovascular: Positive for chest pain.  Gastrointestinal: Positive for abdominal pain.  Musculoskeletal: Positive for myalgias.  Neurological: Positive for weakness and headaches.  Psychiatric/Behavioral: Positive for depression. The patient is nervous/anxious.     Blood pressure 118/77, pulse 92, temperature 98.2 F (36.8 C), temperature source Oral, resp. rate 16, height 5' 5"  (1.651 m), weight 68.04 kg (150 lb), SpO2 99 %.Body mass index is 24.96 kg/(m^2).  General Appearance: Guarded  Eye Contact::  Good  Speech:  Clear and Coherent and Slow  Volume:  Decreased  Mood:  Anxious and Depressed  Affect:  Appropriate and Congruent  Thought Process:  Coherent and Goal Directed  Orientation:  Full (Time, Place, and Person)  Thought Content:  Rumination  Suicidal Thoughts:  No  Homicidal Thoughts:  No  Memory:  Immediate;   Fair Recent;   Fair  Judgement:  Fair  Insight:  Fair  Psychomotor Activity:  Decreased  Concentration:  Good  Recall:  Good  Fund of Knowledge:Good  Language: Good  Akathisia:  NA  Handed:  Right  AIMS (if indicated):     Assets:  Communication Skills Desire for Improvement Financial Resources/Insurance Housing Intimacy Leisure Time Resilience Social Support Talents/Skills Transportation  Sleep:      Musculoskeletal: Strength & Muscle Tone: within normal limits Gait & Station: unable to stand Patient leans: N/A  Treatment Plan Summary: Daily contact with patient to assess and evaluate symptoms and progress in treatment Medication management  Patient will be referred to the outpatient counseling services and medically cleared. Recommended no psychotropic medication at this time.   Chenae Brager,JANARDHAHA R. 03/12/2014 12:42 PM

## 2014-03-12 NOTE — Progress Notes (Signed)
PT Cancellation Note  Patient Details Name: Shelly Le MRN: [id number] DOB: Sep 01, 1960   Cancelled Treatment:    Reason Eval/Treat Not Completed: Patient declined, no reason specified.  Patient refusing PT stating it is too late.  She states "I don't know why you are even here."  Declined to get OOB.  Will try PT eval again in am.   Despina Pole 03/12/2014, 6:47 PM Carita Pian. Sanjuana Kava, [place] Pager [phone]

## 2014-03-12 NOTE — Progress Notes (Signed)
Chaplain initiated follow up with pt. Doctors came in for visit. Chaplain will return later. Pt said chaplain's last visit was helpful.   03/12/14 1000  Clinical Encounter Type  Visited With Patient;Health care provider  Visit Type Follow-up;Spiritual support  Consult/Referral To Chaplain  Recommendations Follow Up  Rolly Salter, Chaplain 03/12/2014 10:40 AM

## 2014-03-12 NOTE — Progress Notes (Signed)
Subjective: MRI yesterday showed bilobed left hemorrhagic renal cyst. I was unable to speak to her son yesterday per her request.  As she was agreeable to trying PO, her medications were all switched from IV. In the evening, she requested IV Dilaudid. Given the nociceptive nature of her pain, I deferred from adding it back on, especially since she hadn't taken a single dose of her home Norco. Per RN charting, her pain was 4/10 overnight, down from  5-6/10 the day prior when she was receiving IV Dilaudid.   It appears this AM she has not taken her home Norco. She reiterates that she is not a special patient and that she is aware that providers feel "it's all in her head." We attempted to have her clarify what it is exactly that she would like treated before she could go home, and she started perseverating on nausea and urinary incontinence. She also noted how she would like to be home and not in the hospital and that [facility] was [facility] since the providers at [facility] could not even handle the complexity of her case. We offered to have her seen by psychiatry, and she felt she had previously exhausted that option in the past and that she has no problems talking to her friend who holds a PhD as well as another friend who is a Clinical biochemist.   Objective: Vital signs in last 24 hours: Filed Vitals:   03/11/14 1750 03/11/14 2110 03/12/14 0221 03/12/14 0556  BP: 112/65 116/69 113/67 117/67  Pulse: 82 83 65 64  Temp: 98.6 F (37 C) 98.2 F (36.8 C) 97.5 F (36.4 C) 98 F (36.7 C)  TempSrc: Oral Oral Oral Oral  Resp: 18 18 18 18   Height:      Weight:      SpO2: 99% 97% 95% 99%   Weight change:   Intake/Output Summary (Last 24 hours) at 03/12/14 0728 Last data filed at 03/11/14 1750  Gross per 24 hour  Intake    240 ml  Output      0 ml  Net    240 ml    General: resting in bed comfortably HEENT: PERRL, EOMI, no scleral icterus Cardiac: RRR, no rubs,  murmurs or gallops Pulm: clear to auscultation bilaterally, no wheezes, rales, or rhonchi Abd: soft, non-distended, bowel sounds present in all four quadrants Ext: warm and well perfused, no pedal edema Neuro: responds to questions appropriately; moving all extremities freely   Lab Results: Basic Metabolic Panel:  Recent Labs Lab 03/07/14 2035  03/11/14 0615 03/12/14 0417  NA  --   < > 138 140  K  --   < > 4.3 3.9  CL  --   < > 105 106  CO2  --   < > 19 24  GLUCOSE  --   < > 77 98  BUN  --   < > 6 4*  CREATININE  --   < > 0.78 0.74  CALCIUM  --   < > 8.6 8.6  MG 2.2  --   --   --   < > = values in this interval not displayed. Liver Function Tests:  Recent Labs Lab 03/07/14 2035  AST 15  ALT 22  ALKPHOS 72  BILITOT 0.2*  PROT 6.9  ALBUMIN 3.7    Recent Labs Lab 03/07/14 2035 03/09/14 0930  LIPASE 62* 57   CBC:  Recent Labs Lab 03/07/14 1551  03/08/14 0835 03/09/14 0524  WBC 8.2 11.4* 9.9  NEUTROABS 5.6  --   --   HGB 13.6 10.4* 9.9*  HCT 39.6 30.9* 29.7*  MCV 78.3 80.7 80.3  PLT 262 246 241   Urine Drug Screen: Drugs of Abuse     Component Value Date/Time   LABOPIA NONE DETECTED 03/07/2014 1726   COCAINSCRNUR NONE DETECTED 03/07/2014 1726   LABBENZ POSITIVE* 03/07/2014 1726   AMPHETMU NONE DETECTED 03/07/2014 1726   THCU NONE DETECTED 03/07/2014 1726   LABBARB NONE DETECTED 03/07/2014 1726    Urinalysis:  Recent Labs Lab 03/07/14 1726  COLORURINE YELLOW  LABSPEC 1.007  PHURINE 8.0  GLUCOSEU NEGATIVE  HGBUR SMALL*  BILIRUBINUR NEGATIVE  KETONESUR NEGATIVE  PROTEINUR NEGATIVE  UROBILINOGEN 0.2  NITRITE NEGATIVE  LEUKOCYTESUR TRACE*   Micro Results: Recent Results (from the past 240 hour(s))  MRSA PCR Screening     Status: None   Collection Time: 03/02/14  5:43 PM  Result Value Ref Range Status   MRSA by PCR NEGATIVE NEGATIVE Final    Comment:        The GeneXpert MRSA Assay (FDA approved for NASAL specimens only), is one  component of a comprehensive MRSA colonization surveillance program. It is not intended to diagnose MRSA infection nor to guide or monitor treatment for MRSA infections.   Studies/Results: Mr Abdomen W Contrast  03/11/2014   CLINICAL DATA:  Evaluate left renal lesion on CT  EXAM: MRI ABDOMEN WITHOUT CONTRAST  TECHNIQUE: Multiplanar multisequence MR imaging of the abdomen was performed after the administration of intravenous contrast.  COMPARISON:  CT abdomen pelvis dated 03/07/2014  FINDINGS: Motion degraded images.  1.6 x 2.7 cm bilobed, hemorrhagic left renal sinus cyst (series 1200/image 61), with additional layering hemorrhage, corresponding to the CT abnormality. Although mildly constrained by motion degradation, there is no convincing enhancement on post contrast subtraction imaging. This appearance is compatible with a benign hemorrhagic cyst (Bosniak II).  Right kidney is within normal limits. No hydronephrosis.  Liver is within normal limits. No suspicious/enhancing hepatic lesions.  Gallbladder is notable for layering sludge (series 5/ image 14), without associated inflammatory changes. No intrahepatic or extrahepatic ductal dilatation.  Pancreas, spleen, and adrenal glands are within normal limits.  No abdominal ascites.  No suspicious abdominal lymphadenopathy.  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  No focal osseous lesions.  IMPRESSION: 2.7 cm bilobed, hemorrhagic left renal sinus cyst, corresponding to the CT abnormality, benign (Bosniak II).   Electronically Signed   By: Julian Hy M.D.   On: 03/11/2014 11:56   Medications: I have reviewed the patient's current medications. Scheduled Meds: . desmopressin  1 drop Nasal BID  . enoxaparin (LOVENOX) injection  40 mg Subcutaneous Q24H  . gabapentin  300 mg Oral TID  . hydrocortisone  10 mg Oral Daily  . hydrocortisone  20 mg Oral Daily  . lacosamide  100 mg Oral BID  . levETIRAcetam  1,500 mg Oral BID  .  levothyroxine  100 mcg Oral QAC breakfast  . sodium chloride  3 mL Intravenous Q12H   Continuous Infusions: . 0.9 % NaCl with KCl 20 mEq / L 125 mL/hr at 03/12/14 0556   PRN Meds:.HYDROcodone-acetaminophen, LORazepam, promethazine, tiZANidine Assessment/Plan: Active Problems:   Seizure   Intractable nausea and vomiting   Abdominal pain   Kidney lesion, native, left  Ms. Baise is a 53 year old woman with multiple medical co-morbidities hospitalized for intractable nausea and vomiting, now improving, and found  to have L renal cyst.  Intractable nausea and vomiting: Workup for GI pathology has been unremarkable here and at other institutions as well. Psychiatric disorder is suspect though unclear if there's primary vs secondary gain given her prior history of psychosocial trauma. She is not agreeable to have psychiatry see her though we will consult them anyhow. 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  -Continue Cortef 20mg  qAM & 10mg  qPM -Continue home Synthroid 134mcg -Continue ddAVP nasal spray  Odynophagia: Unclear etiology. Swallow evaluation at prior hospitalization was unremarkable. It appears improved though is inconsistent.   Left renal cyst: Stable from CT done 6 years ago.Marland Kitchen  #FEN:  -Diet: Heart Healthy  #DVT prophylaxis: Lovenox  #CODE STATUS: FULL CODE  Dispo: Disposition is deferred at this time, awaiting improvement of current medical problems.    The patient does have a current PCP (Daneil Dan, MD) and does not need an [facility] hospital follow-up appointment after discharge.  The patient does have transportation limitations that hinder transportation to clinic appointments.  .Services Needed at time of discharge: Y = Yes, Blank = No PT:   OT:   RN:   Equipment:   Other:     LOS: 5 days   Charlott Rakes, MD 03/12/2014, 7:28 AM

## 2014-03-12 NOTE — Progress Notes (Signed)
Pt c/o pain in right arm and shoulder. Pt requested a one-time Dilaudid order. The attending, Dr. Beryle Beams, paged this a.m. He said that Dilaudid would not be ordered at this time because it has not helped pt's pain long term. Pt has refused to take the Norco that is ordered. The resident, Dr. Posey Pronto, paged this afternoon regarding the same request for Dilaudid. He also does not want to order Dilaudid at this time, for the same reason. Dr. Posey Pronto spoke to the pt's son, who agreed with the physicians that Dilaudid is not the best pain medicine for the pt at this time. Pt is still refusing Norco at this time. Will continue to monitor.

## 2014-03-12 NOTE — Progress Notes (Signed)
Pt ate about half of her pancake for breakfast this a.m.

## 2014-03-12 NOTE — Progress Notes (Signed)
Patient ID: Shelly Le, female   DOB: 03-09-1961, 53 y.o.   MRN: [id number] Medicine attending: I personally interviewed and examined this patient this morning together with resident physicians Dr. Charlott Rakes and Dr. Denton Brick. We spent more than 30 minutes discussing her atypical GI symptoms. We don't have any clear-cut solutions to her chronic GI problems. We reassured her that we have excluded any possible life-threatening medical condition at this time. We do feel it would be beneficial for her to get some counseling to assist her in coping with her situation.

## 2014-03-12 NOTE — Progress Notes (Signed)
Chaplain initiated follow up with pt. Pt described how she has been advocating for herself and her health. Pt reports (and has spoken with director) at [facility] regarding care particularily as she was admitted in the ED. Chaplain and pt spoke about how spiritual life is at the center of pt's life. Pt remarks that this is what is keeping her going. Pt has ample spiritual support from family and friends who have driven great distances to pray over her. Pt awareness of demons (see previous note) comes from her strong connection to faith tradition of "discernment of spirits." Pt wants to go home but is considering being admitted to [facility] in [place]. Chaplain offered prayer, emotional support, and facilitated storytelling. Chaplain will continue to follow. Page as needed.    03/12/14 1600  Clinical Encounter Type  Visited With Patient  Visit Type Follow-up  Spiritual Encounters  Spiritual Needs Prayer;Emotional  Stress Factors  Patient Stress Factors Exhausted  Rolly Salter, Chaplain 03/12/2014 4:08 PM

## 2014-03-13 DIAGNOSIS — F4329 Adjustment disorder with other symptoms: Secondary | ICD-10-CM | POA: Diagnosis present

## 2014-03-13 DIAGNOSIS — J189 Pneumonia, unspecified organism: Secondary | ICD-10-CM

## 2014-03-13 DIAGNOSIS — F4325 Adjustment disorder with mixed disturbance of emotions and conduct: Secondary | ICD-10-CM

## 2014-03-13 DIAGNOSIS — G8929 Other chronic pain: Secondary | ICD-10-CM

## 2014-03-13 LAB — GLUCOSE, CAPILLARY: Glucose-Capillary: 98 mg/dL (ref 70–99)

## 2014-03-13 LAB — BASIC METABOLIC PANEL
Anion gap: 13 (ref 5–15)
BUN: 4 mg/dL — ABNORMAL LOW (ref 6–23)
CALCIUM: 8.8 mg/dL (ref 8.4–10.5)
CO2: 22 mEq/L (ref 19–32)
Chloride: 106 mEq/L (ref 96–112)
Creatinine, Ser: 0.74 mg/dL (ref 0.50–1.10)
GFR calc Af Amer: >90 mL/min (ref >90)
Glucose, Bld: 81 mg/dL (ref 70–99)
Potassium: 3.9 mEq/L (ref 3.7–5.3)
Sodium: 141 mEq/L (ref 137–147)

## 2014-03-13 MED ORDER — HYDROCORTISONE 10 MG PO TABS: ORAL_TABLET | ORAL | Status: DC

## 2014-03-13 MED ORDER — LORAZEPAM 1 MG PO TABS: 1.0000 mg | ORAL_TABLET | Freq: Three times a day (TID) | ORAL | Status: DC

## 2014-03-13 NOTE — Progress Notes (Signed)
Pt ate 50% of lunch, no c/o nausea.  Pt ambulated to restroom and assisted to bed, heat packs applied.  Will continue to monitor. Cori Razor, RN

## 2014-03-13 NOTE — Progress Notes (Signed)
Pt ambulated to bathroom several times with one assist and walker.  Will continue to monitor. Cori Razor, RN 03/13/2014

## 2014-03-13 NOTE — Progress Notes (Signed)
Pt worked with PT.  Up in chair for 1.5 hours then assisted back to bed, tolerated well.  Pt c/o nausea, given phenergan, no vomiting noted. Heating packs applied to her right shoulder and right forearm for relief of pain.  Will continue to monitor. Cori Razor, RN

## 2014-03-13 NOTE — Progress Notes (Addendum)
Subjective: This AM, she had her tray at bedside. She reported that her PO intake was not much improved. She also complained of some back pain radiating from her IV on her R arm for which she was requesting Dilaudid yesterday. She also reported to me that the psychiatrist yesterday told her it was a waste of his time to have been seen by him and that her son would never say anything against me as he was raised that way. She was agreeable to working on getting out of bed and with physical therapy though reported that the last time she didn't was because they came at an irregular time of day and gave her "rubber socks" to wear.   Per CM, she refused physical therapy today. Per Licensed conveyancer, she watched patient tolerate PO intake. RN called me this AM and was asking for a regular diet - again inconsistent with her report at bedside.  Objective: Vital signs in last 24 hours: Filed Vitals:   03/12/14 1756 03/12/14 2124 03/13/14 0222 03/13/14 0527  BP: 113/64 103/77 97/56 111/71  Pulse: 82 73 82 62  Temp: 98.7 F (37.1 C) 98.4 F (36.9 C) 98.8 F (37.1 C) 98.2 F (36.8 C)  TempSrc: Oral Oral Axillary Oral  Resp: 20 20 20 16   Height:      Weight:      SpO2: 100% 100% 95% 100%   Weight change:  No intake or output data in the 24 hours ending 03/13/14 1017  General: resting in bed comfortably HEENT: PERRL, EOMI, no scleral icterus Cardiac: RRR, no rubs, murmurs or gallops Pulm: clear to auscultation bilaterally, no wheezes, rales, or rhonchi Abd: soft, non-distended, bowel sounds present in all four quadrants, no tenderness noted in all four quadrants Ext: warm and well perfused, no pedal edema Neuro: responds to questions appropriately; moving all extremities freely  Lab Results: Basic Metabolic Panel:  Recent Labs Lab 03/07/14 2035  03/12/14 0417 03/13/14 0829  NA  --   < > 140 141  K  --   < > 3.9 3.9  CL  --   < > 106 106  CO2  --   < > 24 22  GLUCOSE  --   < > 98 81  BUN   --   < > 4* 4*  CREATININE  --   < > 0.74 0.74  CALCIUM  --   < > 8.6 8.8  MG 2.2  --   --   --   < > = values in this interval not displayed.  Urine Drug Screen: Drugs of Abuse     Component Value Date/Time   LABOPIA NONE DETECTED 03/07/2014 1726   COCAINSCRNUR NONE DETECTED 03/07/2014 1726   LABBENZ POSITIVE* 03/07/2014 1726   AMPHETMU NONE DETECTED 03/07/2014 1726   THCU NONE DETECTED 03/07/2014 1726   LABBARB NONE DETECTED 03/07/2014 1726    Urinalysis:  Recent Labs Lab 03/07/14 1726  COLORURINE YELLOW  LABSPEC 1.007  PHURINE 8.0  GLUCOSEU NEGATIVE  HGBUR SMALL*  BILIRUBINUR NEGATIVE  KETONESUR NEGATIVE  PROTEINUR NEGATIVE  UROBILINOGEN 0.2  NITRITE NEGATIVE  LEUKOCYTESUR TRACE*   Studies/Results: Mr Abdomen W Contrast  03/11/2014   CLINICAL DATA:  Evaluate left renal lesion on CT  EXAM: MRI ABDOMEN WITHOUT CONTRAST  TECHNIQUE: Multiplanar multisequence MR imaging of the abdomen was performed after the administration of intravenous contrast.  COMPARISON:  CT abdomen pelvis dated 03/07/2014  FINDINGS: Motion degraded images.  1.6 x 2.7 cm bilobed, hemorrhagic  left renal sinus cyst (series 1200/image 61), with additional layering hemorrhage, corresponding to the CT abnormality. Although mildly constrained by motion degradation, there is no convincing enhancement on post contrast subtraction imaging. This appearance is compatible with a benign hemorrhagic cyst (Bosniak II).  Right kidney is within normal limits. No hydronephrosis.  Liver is within normal limits. No suspicious/enhancing hepatic lesions.  Gallbladder is notable for layering sludge (series 5/ image 14), without associated inflammatory changes. No intrahepatic or extrahepatic ductal dilatation.  Pancreas, spleen, and adrenal glands are within normal limits.  No abdominal ascites.  No suspicious abdominal lymphadenopathy.  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  No focal osseous lesions.  IMPRESSION: 2.7  cm bilobed, hemorrhagic left renal sinus cyst, corresponding to the CT abnormality, benign (Bosniak II).   Electronically Signed   By: Julian Hy M.D.   On: 03/11/2014 11:56   Medications: I have reviewed the patient's current medications. Scheduled Meds: . desmopressin  1 drop Nasal BID  . enoxaparin (LOVENOX) injection  40 mg Subcutaneous Q24H  . gabapentin  300 mg Oral TID  . hydrocortisone  10 mg Oral Daily  . hydrocortisone  20 mg Oral Daily  . lacosamide  100 mg Oral BID  . levETIRAcetam  1,500 mg Oral BID  . levothyroxine  100 mcg Oral QAC breakfast  . sodium chloride  3 mL Intravenous Q12H   Continuous Infusions:   PRN Meds:.HYDROcodone-acetaminophen, LORazepam, promethazine, tiZANidine Assessment/Plan: Active Problems:   Seizure   Intractable nausea and vomiting   Abdominal pain   Renal cyst, left  Ms. Hallinan is a 53 year old woman with multiple medical co-morbidities hospitalized for intractable nausea and vomiting likely 2/2 adjustment disorder and found to have L renal cyst.  Intractable nausea and vomiting: Initial psychiatric diagnosis is adjustment disorder with mixed emotional features though a baseline personality disorder is suspect. She's recommended to purse further outpatient counseling. Workup for GI pathology has been unremarkable here and at other institutions as well.  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  -Continue Cortef 20mg  qAM & 10mg  qPM -Continue home Synthroid 180mcg -Continue ddAVP nasal spray  Odynophagia: Unclear etiology. Swallow evaluation at prior hospitalization was unremarkable. It appears improved though is inconsistent.   Left renal cyst: Stable from CT done 6 years ago.Marland Kitchen  #FEN:  -Diet: Heart Healthy  #DVT prophylaxis: Lovenox  #CODE STATUS: FULL CODE  Dispo: Disposition is deferred at this time, awaiting  improvement of current medical problems.    The patient does have a current PCP (Daneil Dan, MD) and does not need an [facility] hospital follow-up appointment after discharge.  The patient does have transportation limitations that hinder transportation to clinic appointments.  .Services Needed at time of discharge: Y = Yes, Blank = No PT:   OT:   RN:   Equipment:   Other:     LOS: 6 days   Charlott Rakes, MD 03/13/2014, 10:17 AM

## 2014-03-13 NOTE — Progress Notes (Signed)
Patient ID: Shelly Le, female   DOB: Mar 10, 1961, 53 y.o.   MRN: [id number] Attending physician discharge note: I personally interviewed and examined this patient on the day of discharge and I concur with the evaluation and discharge plan as recorded by resident physician Dr. Charlott Rakes.  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 She has developed a seizure disorder related to the surgery. She has developed chronic, idiopathic, gastrointestinal symptoms with intermittent abdominal pain, and refractory nausea and vomiting  which are not felt to be related to her pituitary disease.. She has been on chronic narcotic analgesics to control her intermittent abdominal pain and most recently was on a fentanyl Transderm patch. She has problems with chronic urinary incontinence. She has had previous evaluations for her GI , GU problems and her pituitary disease at [facility] on multiple occasions over the last year including upper and lower endoscopy. She has been followed on a chronic basis at the [facility] in [place]. She was initially admitted here with recurrent seizures 02/26/2014. During that admission she had multiple complaints with atypical migratory abdominal and chest pain, intermittent dyspnea with choking sensation in her throat. Multiple cardiograms and cardiac lead tests were unremarkable. A nuclear medicine scan was low probability for pulmonary embolus. She developed a vague, early, left lower lobe pulmonary infiltrate and was put on antibiotics to cover the possibility of early aspiration pneumonia. She complained of severe right thigh pain on admission and thought she was bitten by an insect. There was a small bruise with no break in the skin on the lateral proximal right thigh which resolved  spontaneously.  She was discharged on November 3 only to be readmitted the next day November 4. She presented with an abdominal crisis with severe, generalized, abdominal pain, worse in the lower quadrants, with associated intractable nausea and vomiting without hematemesis.  On initial exam her abdomen was diffusely tender but not distended. Bowel sounds hypoactive. No obvious mass or organomegaly. No peritoneal signs. A noncontrast CT scan of the abdomen showed a normal liver and pancreas. No signs of bowel obstruction. No free air. Questionable lesion in the left kidney.  Hospital course: She was treated with hydration, analgesics, and antiemetics. She did not respond to zoledronic acid but did have a partial response to a combination of lorazepam and Phenergan. At time of my initial exam, pain appeared to be most localized to the right lower abdominal quadrant. Surgical consultation was obtained. At time of their exam there was no significant abdominal tenderness in any quadrant. It was not felt that she had a surgical abdomen. Ultrasound showed some sludge in the gallbladder without any stones, no gallbladder wall thickening, no inflammatory fluid collection outside the gallbladder. Appendix appeared normal on the CT scan. Subsequent MRI scan done for better characterization of the lesion in the left kidney shows this appeared to be a simple, benign, hemorrhagic cyst.  All of her hormonal replacement and antiseizure medicines were given by vein.  Despite resolution of the pain, nausea, and vomiting, the patient continued to state that she could not eat or drink anything. Direct observation by some of the nurses reported directly to me and the resident physicians contradicted the patient's history as she was observed to be able to eat intermittently without vomiting. We suggested to the patient that some of her symptoms  might be stress related but she was reluctant to accept this. We had multiple  lengthy discussions with her and the resident physician had multiple phone and personal conferences with the patient's son to review her status. We asked psychiatry to evaluate the patient. Impression was "adjustment disorder with mixed emotional features". No medication management was recommended. Recommendation was for outpatient counseling including family counseling.  We felt that the patient had reached the maximum benefit of further hospitalization and was overall stable for discharge at this time. Resident physician was in contact with her primary care physician to make sure that she has good follow-up and that changes in her medications were transmitted. We stopped her fentanyl during the first admission since it could potentially lower her seizure threshold. She frequently requested Dilaudid for pain control stating that oral medications were not effective. I did not feel that narcotic analgesics were indicated given the benign abdominal exam andnormal imaging studies outlined above.

## 2014-03-13 NOTE — Progress Notes (Signed)
Discharge orders received.  Discharge instructions and follow-up appointments reviewed with the patient and her son.  Education complete.  Pt complains that social work has not come in to see her and talk about her home health therapies.  This nurse explained to her that per case management she is already set up for home health with [facility] from prior to admission.  Pt stated that she is not set up with home health and that PT was not coming into her home.  She was complaining that PT told her that they were going to have social work come see her today and they did not.  I again explained to her that her home health services are already set up per case management's note in the chart.   Pt states that she understands and she agrees to get dressed to be discharged.  Pt transported out via wheelchair, son present.  Cori Razor, RN

## 2014-03-13 NOTE — Progress Notes (Signed)
Patient is active with [facility] as prior to admission for [place]ins [phone]

## 2014-03-13 NOTE — Evaluation (Signed)
Physical Therapy Evaluation Patient Details Name: Shelly Le MRN: [id number] DOB: 1960-06-11 Today's Date: 03/13/2014   History of Present Illness  Patient is a 53 y/o female admitted with severe onset of abdominal pain and vomiting. When the son arrived to assist her, she was found on the floor unresponsive but still breathing. EMS was called. PMH of seizure history, addison disease, diabetes inspidus, hypothyroidism, and anemia who was recently discharged from the hospital on 03/06/14 for seizure disorder and CAP. Non contrast CT- shows possible lesion in left kidney.    Clinical Impression  Patient presents with functional limitations due to deficits listed in PT problem list (see below). Pt with generalized weakness and balance deficits impacting safe mobility. Lengthy discussion about disposition and need for 24/7 supervision and follow up therapy services at discharge however pt refusing. Pt not safe to be home alone due to being high fall risk (which pt seems aware of stating she is weak and needs her son to carry her up the steps to enter her apt) however continues to decline services. Pt would benefit from acute PT to improve transfers, gait, balance and safe mobility so pt can maximize independence and return to PLOF.    Follow Up Recommendations Supervision/Assistance - 24 hour;Home health PT (Would benefit from SNF but pt refusing both recommendations. Pt willing to talk to SW about using [employer] (a company/agency?) (and a specific person that works for that company to work with her).)    Equipment Recommendations  Other (comment)    Recommendations for Other Services OT consult     Precautions / Restrictions Precautions Precautions: Fall Restrictions Weight Bearing Restrictions: No      Mobility  Bed Mobility Overal bed mobility: Needs Assistance Bed Mobility: Supine to Sit     Supine to sit: Min guard     General bed mobility comments: Min guard for safety. No  physical assist required. Increased time as pt distracted discussing unhappiness about care in hospital.  Transfers Overall transfer level: Needs assistance Equipment used: Rolling walker (2 wheeled) Transfers: Sit to/from Stand Sit to Stand: Min guard         General transfer comment: Min guard to stand for balance/safety. Stood from toilet with grab bar for assist. VC's for hand placement as pt using BUEs to pull up on RW.   Ambulation/Gait Ambulation/Gait assistance: Min assist Ambulation Distance (Feet): 20 Feet (x2 bouts) Assistive device: Rolling walker (2 wheeled) Gait Pattern/deviations: Step-through pattern;Decreased stride length;Leaning posteriorly Gait velocity: slow   General Gait Details: Slow, unsteady gait with mild balance deficits. Posterior lean noted to relieve back pain per pt report. Pt able to voice complaints and unhappiness about care at hospital.   Stairs            Wheelchair Mobility    Modified Rankin (Stroke Patients Only)       Balance Overall balance assessment: Needs assistance Sitting-balance support: Feet supported;No upper extremity supported Sitting balance-Leahy Scale: Good Sitting balance - Comments: Able to donn socks sitting EOB reaching outside BoS without difficulty.    Standing balance support: During functional activity;Bilateral upper extremity supported Standing balance-Leahy Scale: Poor Standing balance comment: Requires BUE support on RW for safety/balance.                              Pertinent Vitals/Pain Pain Assessment: 0-10 Pain Score:  (not rated on pain scale.) Pain Location: Right shoulder. Pain Descriptors / Indicators:  Aching;Sore Pain Intervention(s): Limited activity within patient's tolerance;Monitored during session;Repositioned;Patient requesting pain meds-RN notified    Home Living Family/patient expects to be discharged to:: Private residence Living Arrangements: Alone Available Help  at Discharge: Family;Friend(s);Available PRN/intermittently Type of Home: Apartment Home Access: Stairs to enter Entrance Stairs-Rails: Right Entrance Stairs-Number of Steps: 1 flight Home Layout: One level Home Equipment: [place] - 4 wheels;Cane - single point Additional Comments: Pt not forth coming with information - difficult to obtain history.  as pt gets easily agitated. Information provided is contradictory. Pt reporting concern for building strength and wanting to return home however declining services to help with improving strength and not able to negotiate flight of steps to get into apt (son carried her up them last time).   Prior Function Level of Independence: Independent with assistive device(s)         Comments: Reports owning rollator at home in [place] but is it not here in [place]. When asked if she cleans/cooks, "girl I am OCD, I can't stand a mess."     Hand Dominance        Extremity/Trunk Assessment   Upper Extremity Assessment: Generalized weakness (Assessment limited due to pain in RUE. Pt using BUEs to pull up on walker.)           Lower Extremity Assessment: Generalized weakness      Cervical / Trunk Assessment: Normal  Communication   Communication: No difficulties  Cognition Arousal/Alertness: Awake/alert Behavior During Therapy: Agitated Overall Cognitive Status: Within Functional Limits for tasks assessed Area of Impairment: Safety/judgement;Problem solving         Safety/Judgement: Decreased awareness of safety     General Comments: "I am weak, I know that but I don't want no therapy, what about occupational therapy?" Pt providing contradictory information throughout evaluation. Pt refusing HHPT and SNF.    General Comments General comments (skin integrity, edema, etc.): Pt continually stating how weak she is throughout session. Difficulty obtaining information. "So do I need to get someone with me to walk to the bathroom, that is on you if you  say no." "Don't you have massage therapists in the hospital or something to help with this shoulder?" Discussed disposition options in length however pt refusing HHPT, "I don't want people in my house, it's a waste of my time and theirs." Refusing SNF as well.     Exercises        Assessment/Plan    PT Assessment Patient needs continued PT services  PT Diagnosis Generalized weakness;Acute pain;Difficulty walking   PT Problem List Decreased strength;Pain;Decreased activity tolerance;Decreased safety awareness;Decreased balance;Decreased mobility  PT Treatment Interventions Balance training;Gait training;Stair training;Patient/family education;Functional mobility training;Therapeutic activities;Therapeutic exercise   PT Goals (Current goals can be found in the Care Plan section) Acute Rehab PT Goals Patient Stated Goal: to get stronger PT Goal Formulation: With patient Time For Goal Achievement: 03/27/14 Potential to Achieve Goals: Fair    Frequency Min 3X/week   Barriers to discharge Decreased caregiver support;Inaccessible home environment Pt lives alone and has 1 flight of steps to get to apt.    Co-evaluation               End of Session Equipment Utilized During Treatment: Gait belt Activity Tolerance: Patient limited by fatigue Patient left: in chair;with call bell/phone within reach;with nursing/sitter in room Nurse Communication: Mobility status         Time: 9826-4158 PT Time Calculation (min) (ACUTE ONLY): 51 min   Charges:   PT Evaluation $  Initial PT Evaluation Tier I: 1 Procedure PT Treatments $Therapeutic Activity: 8-22 mins $Self Care/Home Management: 8-22   PT G CodesCandy Sledge A 03/13/2014, 11:59 AM Candy Sledge, PT, DPT [phone]

## 2014-03-13 NOTE — Discharge Instructions (Signed)
Thank you for trusting [place] with your medical care!  You were hospitalized for nausea and vomiting. Imaging and bloodwork was reassuring that you did not have a new disease or disorder and that these symptoms are similar to the ones you had back in January. 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 Below is more information.  To help, we will start a new medication called Ativan (take 1mg  every 8 hours). It is a longer acting Xanax, and taking the two together is dangerous.   Please return the Xanax to the pharmacy before filling the Ativan.   Here are your follow-up appointments: -Dr. Darrick Grinder [facility], GI) @ Monday, 11/16 (10:20 AM) -Dr. Sheppard Evens [facility], Endocrinology) @ Monday, 11/16 (1:00 PM) -Dr. Saddie Benders ([place]) @ Tuesday, 11/17 (2:20 PM)    Somatization Disorder Somatization disorder is a type of somatoform disorder. It occurs when a person feels pain and other symptoms that cannot be explained by medical findings. It is diagnosed after many medical tests and exams. The pain, physical symptoms, or emotional symptoms are real. They are not faked or created. Often, people with somatization disorder are worried about their symptoms. Along with a medical caregiver, a mental health caregiver can help to diagnose somatization disorder and teach coping skills. The disorder can last for several years and may be intermittent. CAUSES  It is unclear what causes somatization disorder. It may be related to:  Abnormal nerve impulses.  Psychological stress.  Family history.  Chronic pain.  History of sexual or physical abuse. The disorder usually begins before age 31. It occurs more often in women than men. SYMPTOMS There are many symptoms that can accompany somatization disorder. Usually, several mild to moderate symptoms appear. A person with somatization disorder seeks many medical visits to try to determine the  cause of symptoms. DIAGNOSIS Diagnostic testing will vary depending on the specific symptoms involved. A person with this disorder will often have many medical tests and exams to rule out serious physical health problems. Evaluation may include:  Physical exam.  Screening health questionnaire.  Medical tests, such as blood tests, urine tests, X-rays, or other imaging tests. TREATMENT There is no cure for somatization disorder, but the symptoms can be managed and sometimes prevented. Treatment aims at teaching coping skills, reducing stress, and preventing new episodes. Once somatization disorder is diagnosed, treatment may include:  Regular monitoring and consultation with a dedicated caregiver for evaluation and coping skills.  Regular monitoring and therapy with a mental health provider to increase understanding of triggers and coping skills. Therapies may include:  Talk therapy.  Cognitive behavioral therapy.  Antidepressant medicines. It can be helpful for a primary caregiver and mental health provider to work together to develop a treatment plan. HOME CARE INSTRUCTIONS  Follow up with your primary caregiver or mental health provider as directed.  Take medicines as directed.  Try to reduce stress.  Exercise regularly. SEEK MEDICAL CARE IF:  New symptoms appear.  Pain or symptoms do not go away or become severe. SEEK IMMEDIATE MEDICAL CARE IF: You feel that your life or health are in danger. MAKE SURE YOU:  Understand these instructions.  Will watch your condition.  Will get help right away if you are not doing well or get worse. Document Released: 05/23/2010 Document Revised: 07/13/2011 Document Reviewed: 05/23/2010 [facility] Patient Information 2015 [place], [place]. This information is not intended to replace advice given to you by your health care  provider. Make sure you discuss any questions you have with your health care provider.

## 2014-03-15 NOTE — Progress Notes (Signed)
Received message that Shelly Le has been calling the unit stating that she has been falling and home health care has not seen her yet; [facility]- they are following her for HHPT and they will contact her today; VM left with Shelly [place]'s son; Aneta Mins [phone]

## 2014-04-12 ENCOUNTER — Emergency Department (HOSPITAL_BASED_OUTPATIENT_CLINIC_OR_DEPARTMENT_OTHER)
Admission: EM | Admit: 2014-04-12 | Discharge: 2014-04-12 | Disposition: A | Discharge status: Home / Self Care | Payer: Medicare Other | Attending: Emergency Medicine | Admitting: Emergency Medicine

## 2014-04-12 DIAGNOSIS — Z91040 Latex allergy status: Secondary | ICD-10-CM | POA: Diagnosis not present

## 2014-04-12 DIAGNOSIS — M79601 Pain in right arm: Secondary | ICD-10-CM | POA: Insufficient documentation

## 2014-04-12 DIAGNOSIS — Z79899 Other long term (current) drug therapy: Secondary | ICD-10-CM | POA: Insufficient documentation

## 2014-04-12 DIAGNOSIS — Z880 Allergy status to penicillin: Secondary | ICD-10-CM | POA: Insufficient documentation

## 2014-04-12 DIAGNOSIS — G40909 Epilepsy, unspecified, not intractable, without status epilepticus: Secondary | ICD-10-CM | POA: Insufficient documentation

## 2014-04-12 DIAGNOSIS — Z86018 Personal history of other benign neoplasm: Secondary | ICD-10-CM | POA: Diagnosis not present

## 2014-04-12 DIAGNOSIS — M25511 Pain in right shoulder: Secondary | ICD-10-CM | POA: Diagnosis present

## 2014-04-12 DIAGNOSIS — Z8701 Personal history of pneumonia (recurrent): Secondary | ICD-10-CM | POA: Diagnosis not present

## 2014-04-12 DIAGNOSIS — E232 Diabetes insipidus: Secondary | ICD-10-CM | POA: Diagnosis not present

## 2014-04-12 MED ORDER — METHOCARBAMOL 500 MG PO TABS: 500.0000 mg | ORAL_TABLET | Freq: Two times a day (BID) | ORAL | Status: DC

## 2014-04-12 MED ORDER — OXYCODONE-ACETAMINOPHEN 5-325 MG PO TABS: 2.0000 | ORAL_TABLET | ORAL | Status: DC | PRN

## 2014-04-12 NOTE — ED Notes (Signed)
Advised by reg clerk that pt is seated in w/c and requesting arm sling-discussed with EDPA-orders for arm sling

## 2014-04-12 NOTE — Discharge Instructions (Signed)
Take Percocet as needed for pain. Take Robaxin as needed for muscle spasm. Apply heat to your arm for pain relief. Refer to attached documents for more information. Follow up with your doctor for further evaluation.

## 2014-04-12 NOTE — ED Provider Notes (Signed)
CSN: [id number]     Arrival date & time 04/12/14  1310 History   First MD Initiated Contact with Patient 04/12/14 1338     Chief Complaint  Patient presents with  . Shoulder Pain     (Consider location/radiation/quality/duration/timing/severity/associated sxs/prior Treatment) HPI Comments: Patient is a 53 year old female with an extensive past medical history including pituitary tumor, Addison's disease, diabetes insipidus, seizures and adjustment disorder who presents with right arm pain that started 1 month ago. Patient reports the pain started while she was in the hospital after being admitted for seizures after multiple IV attempts in that arm. Patient reports persistent, aching pain in the right arm that radiates up to her right shoulder. Palpation of the right arm makes the pain worse. No alleviating factors. Patient denies any injury. Patient reports associated "knot" on her right arm. No other symptoms.    Past Medical History  Diagnosis Date  . Addison's disease   . Diabetes insipidus   . Pneumonia   . Seizures   . Brain tumor    Past Surgical History  Procedure Laterality Date  . Tonsillectomy    . Abdominal hysterectomy    . Brain surgery      crainotomy   No family history on file. History  Substance Use Topics  . Smoking status: Never Smoker   . Smokeless tobacco: Never Used  . Alcohol Use: No   OB History    No data available     Review of Systems  Constitutional: Negative for fever, chills and fatigue.  HENT: Negative for trouble swallowing.   Eyes: Negative for visual disturbance.  Respiratory: Negative for shortness of breath.   Cardiovascular: Negative for chest pain and palpitations.  Gastrointestinal: Negative for nausea, vomiting, abdominal pain and diarrhea.  Genitourinary: Negative for dysuria and difficulty urinating.  Musculoskeletal: Positive for myalgias. Negative for arthralgias and neck pain.  Skin: Negative for color change.   Neurological: Negative for dizziness and weakness.  Psychiatric/Behavioral: Negative for dysphoric mood.      Allergies  Contrast media; Fentanyl; Morphine; Shellfish allergy; Latex; Penicillins; and Metrizamide  Home Medications   Prior to Admission medications   Medication Sig Start Date End Date Taking? Authorizing Provider  desmopressin (DDAVP) 0.01 % nasal solution Place 10 mcg into the nose 2 (two) times daily.    Historical Provider, MD  gabapentin (NEURONTIN) 300 MG capsule Take 300 mg by mouth 3 (three) times daily.    Historical Provider, MD  hydrocortisone (CORTEF) 10 MG tablet Take 2 tablets in the morning, 1 in the evening. 03/13/14   Charlott Rakes, MD  levETIRAcetam (KEPPRA) 750 MG tablet Take 2 tablets (1,500 mg total) by mouth 2 (two) times daily. 03/06/14   Charlott Rakes, MD  levothyroxine (SYNTHROID, LEVOTHROID) 100 MCG tablet Take 100 mcg by mouth daily before breakfast.    Historical Provider, MD  mirtazapine (REMERON) 15 MG tablet Take 15 mg by mouth at bedtime.    Historical Provider, MD  olopatadine (PATANOL) 0.1 % ophthalmic solution Place 1 drop into both eyes 2 (two) times daily.    Historical Provider, MD  potassium chloride (K-DUR) 10 MEQ tablet Take 10 mEq by mouth 2 (two) times daily.    Historical Provider, MD  promethazine (PHENERGAN) 25 MG tablet Take 25 mg by mouth every 6 (six) hours as needed for nausea or vomiting (nausea & vomiting).    Historical Provider, MD  tiZANidine (ZANAFLEX) 4 MG tablet Take 8 mg by mouth every 8 (eight)  hours as needed for muscle spasms.    Historical Provider, MD   BP 94/60 mmHg  Pulse 76  Temp(Src) 98.7 F (37.1 C) (Oral)  Resp 18  Ht 5\' 5"  (1.651 m)  Wt 155 lb (70.308 kg)  BMI 25.79 kg/m2  SpO2 96% Physical Exam  Constitutional: She is oriented to person, place, and time. She appears well-developed and well-nourished. No distress.  HENT:  Head: Normocephalic and atraumatic.  Eyes: Conjunctivae and EOM are normal.   Neck: Normal range of motion.  Cardiovascular: Normal rate and regular rhythm.  Exam reveals no gallop and no friction rub.   No murmur heard. Strong right radial pulse.   Pulmonary/Chest: Effort normal and breath sounds normal. She has no wheezes. She has no rales. She exhibits no tenderness.  Abdominal: Soft. She exhibits no distension. There is no tenderness. There is no rebound.  Musculoskeletal: Normal range of motion.  Right generalized arm, shoulder and scapular tenderness to palpation without obvious deformity.   Neurological: She is alert and oriented to person, place, and time. Coordination normal.  Speech is goal-oriented. Moves limbs without ataxia.   Skin: Skin is warm and dry.  No open wound or superficial thrombophlebitis.   Psychiatric: She has a normal mood and affect. Her behavior is normal.  Nursing note and vitals reviewed.   ED Course  Procedures (including critical care time) Labs Review Labs Reviewed - No data to display  Imaging Review No results found.   EKG Interpretation None      MDM   Final diagnoses:  Right arm pain    2:34 PM Patient likely has muscle pain that is reproducible on palpation. No signs of thrombophlebitis or DVT. No neurovascular compromise of extremity. No injury that resulted in pain. Patient has many chronic health problems that would be best managed by her PCP. Patient instructed to follow up with her PCP.    Alvina Chou, PA-C 04/12/14 [place] Y[place], MD 04/12/14 [phone]

## 2014-04-12 NOTE — ED Notes (Signed)
Sling was placed by Tad Moore, EMT and was assisted to cab

## 2014-04-12 NOTE — ED Notes (Signed)
Reports recent hospitalization at [facility]- pt has extensive medical history- today c/o pain in right arm and shoulder and "a knot came up today"

## 2014-06-10 ENCOUNTER — Inpatient Hospital Stay (HOSPITAL_COMMUNITY)
Admission: EM | Admit: 2014-06-10 | Discharge: 2014-06-24 | DRG: 101 | Disposition: A | Discharge status: Home Health | Payer: Medicare Other | Attending: Internal Medicine | Admitting: Internal Medicine

## 2014-06-10 ENCOUNTER — Inpatient Hospital Stay (HOSPITAL_COMMUNITY): Payer: Medicare Other

## 2014-06-10 ENCOUNTER — Emergency Department (HOSPITAL_COMMUNITY): Payer: Medicare Other

## 2014-06-10 ENCOUNTER — Encounter (HOSPITAL_COMMUNITY): Payer: Self-pay | Admitting: Emergency Medicine

## 2014-06-10 DIAGNOSIS — A601 Herpesviral infection of perianal skin and rectum: Secondary | ICD-10-CM | POA: Diagnosis present

## 2014-06-10 DIAGNOSIS — R569 Unspecified convulsions: Principal | ICD-10-CM | POA: Diagnosis present

## 2014-06-10 DIAGNOSIS — F419 Anxiety disorder, unspecified: Secondary | ICD-10-CM | POA: Diagnosis present

## 2014-06-10 DIAGNOSIS — E893 Postprocedural hypopituitarism: Secondary | ICD-10-CM | POA: Diagnosis present

## 2014-06-10 DIAGNOSIS — K3184 Gastroparesis: Secondary | ICD-10-CM | POA: Diagnosis present

## 2014-06-10 DIAGNOSIS — Z8639 Personal history of other endocrine, nutritional and metabolic disease: Secondary | ICD-10-CM | POA: Diagnosis not present

## 2014-06-10 DIAGNOSIS — K259 Gastric ulcer, unspecified as acute or chronic, without hemorrhage or perforation: Secondary | ICD-10-CM | POA: Diagnosis present

## 2014-06-10 DIAGNOSIS — D649 Anemia, unspecified: Secondary | ICD-10-CM | POA: Diagnosis not present

## 2014-06-10 DIAGNOSIS — R05 Cough: Secondary | ICD-10-CM

## 2014-06-10 DIAGNOSIS — R6510 Systemic inflammatory response syndrome (SIRS) of non-infectious origin without acute organ dysfunction: Secondary | ICD-10-CM | POA: Diagnosis present

## 2014-06-10 DIAGNOSIS — R059 Cough, unspecified: Secondary | ICD-10-CM

## 2014-06-10 DIAGNOSIS — E272 Addisonian crisis: Secondary | ICD-10-CM | POA: Diagnosis present

## 2014-06-10 DIAGNOSIS — E271 Primary adrenocortical insufficiency: Secondary | ICD-10-CM | POA: Diagnosis present

## 2014-06-10 DIAGNOSIS — E860 Dehydration: Secondary | ICD-10-CM | POA: Diagnosis present

## 2014-06-10 DIAGNOSIS — R1110 Vomiting, unspecified: Secondary | ICD-10-CM

## 2014-06-10 DIAGNOSIS — G894 Chronic pain syndrome: Secondary | ICD-10-CM | POA: Diagnosis present

## 2014-06-10 DIAGNOSIS — E232 Diabetes insipidus: Secondary | ICD-10-CM | POA: Diagnosis present

## 2014-06-10 DIAGNOSIS — R109 Unspecified abdominal pain: Secondary | ICD-10-CM

## 2014-06-10 DIAGNOSIS — R000 Tachycardia, unspecified: Secondary | ICD-10-CM | POA: Diagnosis present

## 2014-06-10 DIAGNOSIS — F4323 Adjustment disorder with mixed anxiety and depressed mood: Secondary | ICD-10-CM | POA: Diagnosis present

## 2014-06-10 DIAGNOSIS — F4329 Adjustment disorder with other symptoms: Secondary | ICD-10-CM | POA: Diagnosis present

## 2014-06-10 DIAGNOSIS — Z79899 Other long term (current) drug therapy: Secondary | ICD-10-CM | POA: Diagnosis not present

## 2014-06-10 DIAGNOSIS — E876 Hypokalemia: Secondary | ICD-10-CM | POA: Diagnosis present

## 2014-06-10 DIAGNOSIS — N179 Acute kidney failure, unspecified: Secondary | ICD-10-CM | POA: Diagnosis present

## 2014-06-10 DIAGNOSIS — A419 Sepsis, unspecified organism: Secondary | ICD-10-CM

## 2014-06-10 DIAGNOSIS — Z7952 Long term (current) use of systemic steroids: Secondary | ICD-10-CM | POA: Diagnosis not present

## 2014-06-10 DIAGNOSIS — D352 Benign neoplasm of pituitary gland: Secondary | ICD-10-CM | POA: Diagnosis present

## 2014-06-10 DIAGNOSIS — I959 Hypotension, unspecified: Secondary | ICD-10-CM | POA: Diagnosis present

## 2014-06-10 DIAGNOSIS — R509 Fever, unspecified: Secondary | ICD-10-CM | POA: Diagnosis present

## 2014-06-10 DIAGNOSIS — R1115 Cyclical vomiting syndrome unrelated to migraine: Secondary | ICD-10-CM

## 2014-06-10 DIAGNOSIS — E230 Hypopituitarism: Secondary | ICD-10-CM | POA: Diagnosis present

## 2014-06-10 DIAGNOSIS — R112 Nausea with vomiting, unspecified: Secondary | ICD-10-CM | POA: Insufficient documentation

## 2014-06-10 DIAGNOSIS — Z7289 Other problems related to lifestyle: Secondary | ICD-10-CM | POA: Diagnosis not present

## 2014-06-10 LAB — CSF CELL COUNT WITH DIFFERENTIAL
RBC COUNT CSF: 6 /mm3 — AB
RBC Count, CSF: 1 /mm3 — ABNORMAL HIGH
TUBE #: 1
TUBE #: 4
WBC CSF: 0 /mm3 (ref 0–5)
WBC, CSF: 0 /mm3 (ref 0–5)

## 2014-06-10 LAB — URINALYSIS, ROUTINE W REFLEX MICROSCOPIC
BILIRUBIN URINE: NEGATIVE
Glucose, UA: NEGATIVE mg/dL
HGB URINE DIPSTICK: NEGATIVE
Ketones, ur: 40 mg/dL — AB
LEUKOCYTES UA: NEGATIVE
Nitrite: NEGATIVE
PROTEIN: NEGATIVE mg/dL
SPECIFIC GRAVITY, URINE: 1.01 (ref 1.005–1.030)
Urobilinogen, UA: 1 mg/dL (ref 0.0–1.0)
pH: 7 (ref 5.0–8.0)

## 2014-06-10 LAB — BASIC METABOLIC PANEL
Anion gap: 13 (ref 5–15)
BUN: 8 mg/dL (ref 6–23)
CALCIUM: 9.6 mg/dL (ref 8.4–10.5)
CHLORIDE: 100 mmol/L (ref 96–112)
CO2: 26 mmol/L (ref 19–32)
Creatinine, Ser: 1.44 mg/dL — ABNORMAL HIGH (ref 0.50–1.10)
GFR calc Af Amer: 47 mL/min — ABNORMAL LOW (ref >90)
GFR calc non Af Amer: 40 mL/min — ABNORMAL LOW (ref >90)
GLUCOSE: 118 mg/dL — AB (ref 70–99)
Potassium: 3.2 mmol/L — ABNORMAL LOW (ref 3.5–5.1)
Sodium: 139 mmol/L (ref 135–145)

## 2014-06-10 LAB — CBC WITH DIFFERENTIAL/PLATELET
Basophils Absolute: 0 10*3/uL (ref 0.0–0.1)
Basophils Relative: 0 % (ref 0–1)
Eosinophils Absolute: 0 10*3/uL (ref 0.0–0.7)
Eosinophils Relative: 0 % (ref 0–5)
HEMATOCRIT: 37.8 % (ref 36.0–46.0)
HEMOGLOBIN: 13 g/dL (ref 12.0–15.0)
LYMPHS ABS: 1.8 10*3/uL (ref 0.7–4.0)
LYMPHS PCT: 20 % (ref 12–46)
MCH: 26.9 pg (ref 26.0–34.0)
MCHC: 34.4 g/dL (ref 30.0–36.0)
MCV: 78.1 fL (ref 78.0–100.0)
MONO ABS: 0.3 10*3/uL (ref 0.1–1.0)
Monocytes Relative: 4 % (ref 3–12)
NEUTROS ABS: 7 10*3/uL (ref 1.7–7.7)
Neutrophils Relative %: 76 % (ref 43–77)
Platelets: 310 10*3/uL (ref 150–400)
RBC: 4.84 MIL/uL (ref 3.87–5.11)
RDW: 12.9 % (ref 11.5–15.5)
WBC: 9.2 10*3/uL (ref 4.0–10.5)

## 2014-06-10 LAB — INFLUENZA PANEL BY PCR (TYPE A & B)
H1N1 flu by pcr: NOT DETECTED
INFLBPCR: NEGATIVE
Influenza A By PCR: NEGATIVE

## 2014-06-10 LAB — MRSA PCR SCREENING: MRSA BY PCR: NEGATIVE

## 2014-06-10 LAB — GLUCOSE, CSF: GLUCOSE CSF: 64 mg/dL (ref 43–76)

## 2014-06-10 LAB — CBG MONITORING, ED: GLUCOSE-CAPILLARY: 112 mg/dL — AB (ref 70–99)

## 2014-06-10 LAB — GRAM STAIN

## 2014-06-10 LAB — PROTEIN, CSF: Total  Protein, CSF: 23 mg/dL (ref 15–45)

## 2014-06-10 MED ORDER — POTASSIUM CHLORIDE CRYS ER 20 MEQ PO TBCR: 40.0000 meq | EXTENDED_RELEASE_TABLET | Freq: Once | ORAL | Status: DC

## 2014-06-10 MED ORDER — POTASSIUM CHLORIDE ER 10 MEQ PO TBCR
10.0000 meq | EXTENDED_RELEASE_TABLET | Freq: Two times a day (BID) | ORAL | Status: DC
  Administered 2014-06-11 – 2014-06-12 (×3): 10 meq via ORAL
  Filled 2014-06-10 (×7): qty 1

## 2014-06-10 MED ORDER — PROPOFOL 10 MG/ML IV BOLUS
200.0000 mg | Freq: Once | INTRAVENOUS | Status: AC
  Administered 2014-06-10: 40 mg via INTRAVENOUS
  Filled 2014-06-10: qty 20

## 2014-06-10 MED ORDER — DESMOPRESSIN ACE RHINAL TUBE 0.01 % NA SOLN
10.0000 ug | Freq: Two times a day (BID) | NASAL | Status: DC
  Administered 2014-06-10 – 2014-06-23 (×24): 10 ug via NASAL
  Filled 2014-06-10 (×4): qty 2.5

## 2014-06-10 MED ORDER — HYDROMORPHONE HCL 1 MG/ML IJ SOLN
1.0000 mg | INTRAMUSCULAR | Status: DC | PRN
  Administered 2014-06-10 – 2014-06-11 (×4): 1 mg via INTRAVENOUS
  Filled 2014-06-10 (×4): qty 1

## 2014-06-10 MED ORDER — METHOCARBAMOL 500 MG PO TABS
500.0000 mg | ORAL_TABLET | Freq: Two times a day (BID) | ORAL | Status: DC
  Administered 2014-06-11 – 2014-06-12 (×2): 500 mg via ORAL
  Filled 2014-06-10 (×7): qty 1

## 2014-06-10 MED ORDER — LEVETIRACETAM IN NACL 1500 MG/100ML IV SOLN
1500.0000 mg | Freq: Two times a day (BID) | INTRAVENOUS | Status: DC
  Administered 2014-06-10 – 2014-06-12 (×4): 1500 mg via INTRAVENOUS
  Filled 2014-06-10 (×5): qty 100

## 2014-06-10 MED ORDER — LORAZEPAM 2 MG/ML IJ SOLN: 1.0000 mg | Freq: Four times a day (QID) | INTRAMUSCULAR | Status: DC | PRN

## 2014-06-10 MED ORDER — HYDROCORTISONE NA SUCCINATE PF 100 MG IJ SOLR
100.0000 mg | Freq: Once | INTRAMUSCULAR | Status: AC
  Administered 2014-06-10: 100 mg via INTRAVENOUS
  Filled 2014-06-10: qty 2

## 2014-06-10 MED ORDER — ONDANSETRON HCL 4 MG PO TABS: 4.0000 mg | ORAL_TABLET | Freq: Four times a day (QID) | ORAL | Status: DC | PRN

## 2014-06-10 MED ORDER — POLYVINYL ALCOHOL 1.4 % OP SOLN
1.0000 [drp] | OPHTHALMIC | Status: DC | PRN
  Filled 2014-06-10: qty 15

## 2014-06-10 MED ORDER — SODIUM CHLORIDE 0.9 % IV SOLN
INTRAVENOUS | Status: DC
  Administered 2014-06-10 – 2014-06-14 (×5): via INTRAVENOUS
  Administered 2014-06-15 (×2): 100 mL via INTRAVENOUS
  Administered 2014-06-16 – 2014-06-17 (×2): via INTRAVENOUS
  Administered 2014-06-17: 100 mL/h via INTRAVENOUS
  Administered 2014-06-18 (×2): via INTRAVENOUS
  Administered 2014-06-19: 1000 mL via INTRAVENOUS
  Administered 2014-06-20 – 2014-06-23 (×3): via INTRAVENOUS

## 2014-06-10 MED ORDER — PROMETHAZINE HCL 25 MG/ML IJ SOLN
12.5000 mg | Freq: Once | INTRAMUSCULAR | Status: AC
  Administered 2014-06-10: 12.5 mg via INTRAVENOUS
  Filled 2014-06-10: qty 1

## 2014-06-10 MED ORDER — DEXTROSE 5 % IV SOLN
10.0000 mg/kg | Freq: Two times a day (BID) | INTRAVENOUS | Status: DC
  Filled 2014-06-10 (×2): qty 10.9

## 2014-06-10 MED ORDER — ACETAMINOPHEN 650 MG RE SUPP: 650.0000 mg | Freq: Four times a day (QID) | RECTAL | Status: DC | PRN

## 2014-06-10 MED ORDER — ONDANSETRON HCL 4 MG/2ML IJ SOLN
4.0000 mg | Freq: Four times a day (QID) | INTRAMUSCULAR | Status: DC | PRN
  Administered 2014-06-11 – 2014-06-12 (×5): 4 mg via INTRAVENOUS
  Filled 2014-06-10 (×6): qty 2

## 2014-06-10 MED ORDER — LEVOTHYROXINE SODIUM 100 MCG IV SOLR
50.0000 ug | Freq: Every day | INTRAVENOUS | Status: DC
  Administered 2014-06-10 – 2014-06-12 (×3): 50 ug via INTRAVENOUS
  Filled 2014-06-10 (×4): qty 5

## 2014-06-10 MED ORDER — LORAZEPAM 2 MG/ML IJ SOLN
INTRAMUSCULAR | Status: AC
Start: 2014-06-10 — End: 2014-06-10
  Filled 2014-06-10: qty 1

## 2014-06-10 MED ORDER — SODIUM CHLORIDE 0.9 % IV SOLN
Freq: Once | INTRAVENOUS | Status: AC
  Administered 2014-06-10: 09:00:00 via INTRAVENOUS

## 2014-06-10 MED ORDER — HEPARIN SODIUM (PORCINE) 5000 UNIT/ML IJ SOLN
5000.0000 [IU] | Freq: Three times a day (TID) | INTRAMUSCULAR | Status: DC
  Administered 2014-06-11 – 2014-06-24 (×41): 5000 [IU] via SUBCUTANEOUS
  Filled 2014-06-10 (×42): qty 1

## 2014-06-10 MED ORDER — GUAIFENESIN-DM 100-10 MG/5ML PO SYRP: 5.0000 mL | ORAL_SOLUTION | ORAL | Status: DC | PRN

## 2014-06-10 MED ORDER — GABAPENTIN 300 MG PO CAPS
300.0000 mg | ORAL_CAPSULE | Freq: Three times a day (TID) | ORAL | Status: DC
  Administered 2014-06-11 – 2014-06-24 (×38): 300 mg via ORAL
  Filled 2014-06-10 (×40): qty 1
  Filled 2014-06-10: qty 3

## 2014-06-10 MED ORDER — LORAZEPAM 2 MG/ML IJ SOLN
2.0000 mg | Freq: Once | INTRAMUSCULAR | Status: AC
  Administered 2014-06-10: 2 mg via INTRAVENOUS

## 2014-06-10 MED ORDER — LEVETIRACETAM 750 MG PO TABS: 1500.0000 mg | ORAL_TABLET | Freq: Two times a day (BID) | ORAL | Status: DC

## 2014-06-10 MED ORDER — OXYCODONE-ACETAMINOPHEN 5-325 MG PO TABS
2.0000 | ORAL_TABLET | Freq: Four times a day (QID) | ORAL | Status: DC | PRN
  Administered 2014-06-11 – 2014-06-12 (×3): 2 via ORAL
  Filled 2014-06-10 (×3): qty 2

## 2014-06-10 MED ORDER — MIRTAZAPINE 15 MG PO TABS
15.0000 mg | ORAL_TABLET | Freq: Every day | ORAL | Status: DC
  Administered 2014-06-11 – 2014-06-23 (×13): 15 mg via ORAL
  Filled 2014-06-10 (×15): qty 1

## 2014-06-10 MED ORDER — ONDANSETRON HCL 4 MG/2ML IJ SOLN
4.0000 mg | Freq: Once | INTRAMUSCULAR | Status: AC
  Administered 2014-06-10: 4 mg via INTRAVENOUS
  Filled 2014-06-10: qty 2

## 2014-06-10 MED ORDER — METOCLOPRAMIDE HCL 5 MG/ML IJ SOLN
10.0000 mg | Freq: Once | INTRAMUSCULAR | Status: AC
  Administered 2014-06-10: 10 mg via INTRAVENOUS
  Filled 2014-06-10: qty 2

## 2014-06-10 MED ORDER — HYDROCORTISONE NA SUCCINATE PF 100 MG IJ SOLR
50.0000 mg | Freq: Three times a day (TID) | INTRAMUSCULAR | Status: DC
  Administered 2014-06-10 – 2014-06-12 (×5): 50 mg via INTRAVENOUS
  Filled 2014-06-10 (×9): qty 1

## 2014-06-10 MED ORDER — PROPOFOL 10 MG/ML IV EMUL
INTRAVENOUS | Status: AC | PRN
  Administered 2014-06-10: 40 mg/h via INTRAVENOUS

## 2014-06-10 MED ORDER — LEVETIRACETAM IN NACL 1500 MG/100ML IV SOLN
1500.0000 mg | Freq: Once | INTRAVENOUS | Status: AC
  Administered 2014-06-10: 1500 mg via INTRAVENOUS
  Filled 2014-06-10: qty 100

## 2014-06-10 MED ORDER — LORAZEPAM 2 MG/ML IJ SOLN
1.0000 mg | Freq: Once | INTRAMUSCULAR | Status: AC
  Administered 2014-06-10: 1 mg via INTRAVENOUS
  Filled 2014-06-10: qty 1

## 2014-06-10 MED ORDER — OLOPATADINE HCL 0.1 % OP SOLN: 1.0000 [drp] | Freq: Two times a day (BID) | OPHTHALMIC | Status: DC

## 2014-06-10 MED ORDER — SODIUM CHLORIDE 0.9 % IV BOLUS (SEPSIS)
1000.0000 mL | Freq: Once | INTRAVENOUS | Status: AC
  Administered 2014-06-10: 1000 mL via INTRAVENOUS

## 2014-06-10 MED ORDER — TIZANIDINE HCL 4 MG PO TABS
8.0000 mg | ORAL_TABLET | Freq: Three times a day (TID) | ORAL | Status: DC | PRN
  Administered 2014-06-11 – 2014-06-12 (×2): 8 mg via ORAL
  Filled 2014-06-10 (×5): qty 2

## 2014-06-10 MED ORDER — ALBUTEROL SULFATE (2.5 MG/3ML) 0.083% IN NEBU: 2.5000 mg | INHALATION_SOLUTION | RESPIRATORY_TRACT | Status: DC | PRN

## 2014-06-10 MED ORDER — LORAZEPAM 2 MG/ML IJ SOLN
INTRAMUSCULAR | Status: AC
  Filled 2014-06-10: qty 1

## 2014-06-10 MED ORDER — PROMETHAZINE HCL 25 MG/ML IJ SOLN
25.0000 mg | Freq: Four times a day (QID) | INTRAMUSCULAR | Status: DC | PRN
  Administered 2014-06-10 – 2014-06-11 (×4): 25 mg via INTRAVENOUS
  Filled 2014-06-10 (×4): qty 1

## 2014-06-10 MED ORDER — DEXTROSE 5 % IV SOLN
10.0000 mg/kg | Freq: Two times a day (BID) | INTRAVENOUS | Status: AC
  Administered 2014-06-10 – 2014-06-12 (×5): 550 mg via INTRAVENOUS
  Filled 2014-06-10 (×6): qty 11

## 2014-06-10 MED ORDER — ACETAMINOPHEN 325 MG PO TABS
650.0000 mg | ORAL_TABLET | Freq: Four times a day (QID) | ORAL | Status: DC | PRN
  Filled 2014-06-10: qty 2

## 2014-06-10 NOTE — Sedation Documentation (Signed)
MD performing Lumbar puncture.

## 2014-06-10 NOTE — Consult Note (Addendum)
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 Pt's family at bedside.  Of importance, she was admitted to [facility] 10/15 with seizures with normal overnight EEG. This time she indicates that she has been having seizures since last night as well as vomiting, fever up to 101 degree on Friday, and HA. Nurse report that in the ED " Pt wheeled to room having seizure. Pt eyes open. Placed pt in bed, pt breathing, rolling around, shaking. Pt airway patent and O2 sats 100% on room air. Pt mumbling words at times. Pt in and out of "seizing" and able to state her name. Pt is not incontinent. MD at bedside. Pt did not bite tongue. Pt is able to talk post ictal". Noted to have herpetic lesions on left inner buttock and sore in the mouth. CT head reviewed by myself showed no acute abnormality but old right frontal craniotomy from previous surgery. Serologies significant for mild hypokalemia and slightly elevated Cr 1.44 Denies vertigo, double vision, focal weakness or numbness, slurred speech, language or vision impairment. LP just performed by ED attending.   Past Medical History  Diagnosis Date  . Addison's disease   . Diabetes insipidus   . Pneumonia   . Seizures   . Brain tumor     Past Surgical History  Procedure Laterality Date  . Tonsillectomy    . Abdominal hysterectomy    . Brain surgery      crainotomy    History reviewed. No  pertinent family history.  Family History: no brain tumors, epilepsy, or brain aneurysm.   Social History:  reports that she has never smoked. She has never used smokeless tobacco. She reports that she does not drink alcohol or use illicit drugs.  Allergies  Allergen Reactions  . Contrast Media [Iodinated Diagnostic Agents] Rash and Shortness Of Breath  . Fentanyl Other (See Comments)    Witnessed grand tonic clonic seizure in ED while wearing patch (02/26/14); h/o seizure disorder  . Morphine Hives and Shortness Of Breath  . Shellfish Allergy Anaphylaxis and Hives  . Latex   . Penicillins Nausea And Vomiting    GI Intolerance  . Metrizamide Rash    MEDICATIONS:  I have reviewed the patient's current medications.   ROS:                                                                                                                                       History obtained from the patient, family, and chart review  General ROS: negative for - chills, fatigue, night sweats, weight gain or weight loss Psychological ROS: negative for - behavioral disorder, hallucinations, memory difficulties, mood swings or suicidal ideation Ophthalmic ROS: negative for - blurry vision, double vision, eye pain or loss of vision ENT ROS: negative for - epistaxis, nasal discharge, oral lesions, sore throat, tinnitus or vertigo Allergy and Immunology ROS: negative for - hives or itchy/watery eyes Hematological and Lymphatic ROS: negative for - bleeding problems, bruising or swollen lymph nodes Endocrine ROS: negative for - galactorrhea, hair pattern changes, polydipsia/polyuria or temperature intolerance Respiratory ROS: negative for - cough, hemoptysis, shortness of breath or wheezing Cardiovascular ROS: negative for - chest pain, dyspnea on exertion, edema or irregular  heartbeat Gastrointestinal ROS: negative for - abdominal pain, diarrhea, hematemesis, nausea/vomiting or stool incontinence Genito-Urinary ROS: negative for - dysuria, hematuria, incontinence or urinary frequency/urgency Musculoskeletal ROS: negative for - joint swelling or muscular weakness Neurological ROS: as noted in HPI Dermatological ROS: negative for rash   Physical exam: pleasant female in no apparent distress. Blood pressure 118/78, pulse 61, temperature 99.1 F (37.3 C), temperature source Oral, resp. rate 12, height _0  (1.626 m), weight 73.936 kg (163 lb), SpO2 100 %. Head: normocephalic. Neck: supple, no bruits, no JVD. Cardiac: no murmurs. Lungs: clear. Abdomen: soft, no tender, no mass. Extremities: no edema. Skin: no rash, left inner buttock lesion with a small pustule on top  Neurologic Examination:                                                                                                      General: Mental Status: Alert, oriented, thought content appropriate.  Speech fluent without evidence of aphasia.  Able to follow 3 step commands without difficulty. Cranial Nerves: II: Discs flat bilaterally; Visual fields grossly normal, pupils equal, round, reactive to light and accommodation III,IV, VI: ptosis not present, extra-ocular motions intact bilaterally V,VII: smile symmetric, facial light touch sensation normal bilaterally VIII: hearing normal bilaterally IX,X: gag reflex present XI: bilateral shoulder shrug XII: midline tongue extension without atrophy or fasciculations  Motor: Moves all extremities spontaneously and symmetrically. Tone and bulk:normal tone throughout; no atrophy noted Sensory: Pinprick and  light touch intact throughout, bilaterally Deep Tendon Reflexes:  1+ all over Plantars: Right: downgoing   Left: downgoing Cerebellar: normal finger-to-nose,  normal heel-to-shin test Gait:  No tested due to safety reasons. CV: pulses palpable  throughout    No results found for: CHOL  Results for orders placed or performed during the hospital encounter of 06/10/14 (from the past 48 hour(s))  CBG monitoring, ED     Status: Abnormal   Collection Time: 06/10/14  7:57 AM  Result Value Ref Range   Glucose-Capillary 112 (H) 70 - 99 mg/dL  CBC with Differential/Platelet     Status: None   Collection Time: 06/10/14  8:15 AM  Result Value Ref Range   WBC 9.2 4.0 - 10.5 K/uL   RBC 4.84 3.87 - 5.11 MIL/uL   Hemoglobin 13.0 12.0 - 15.0 g/dL   HCT 37.8 36.0 - 46.0 %   MCV 78.1 78.0 - 100.0 fL   MCH 26.9 26.0 - 34.0 pg   MCHC 34.4 30.0 - 36.0 g/dL   RDW 12.9 11.5 - 15.5 %   Platelets 310 150 - 400 K/uL   Neutrophils Relative % 76 43 - 77 %   Neutro Abs 7.0 1.7 - 7.7 K/uL   Lymphocytes Relative 20 12 - 46 %   Lymphs Abs 1.8 0.7 - 4.0 K/uL   Monocytes Relative 4 3 - 12 %   Monocytes Absolute 0.3 0.1 - 1.0 K/uL   Eosinophils Relative 0 0 - 5 %   Eosinophils Absolute 0.0 0.0 - 0.7 K/uL   Basophils Relative 0 0 - 1 %   Basophils Absolute 0.0 0.0 - 0.1 K/uL  Basic metabolic panel     Status: Abnormal   Collection Time: 06/10/14  8:15 AM  Result Value Ref Range   Sodium 139 135 - 145 mmol/L   Potassium 3.2 (L) 3.5 - 5.1 mmol/L   Chloride 100 96 - 112 mmol/L   CO2 26 19 - 32 mmol/L   Glucose, Bld 118 (H) 70 - 99 mg/dL   BUN 8 6 - 23 mg/dL   Creatinine, Ser 1.44 (H) 0.50 - 1.10 mg/dL   Calcium 9.6 8.4 - 10.5 mg/dL   GFR calc non Af Amer 40 (L) >90 mL/min   GFR calc Af Amer 47 (L) >90 mL/min    Comment: (NOTE) The eGFR has been calculated using the CKD EPI equation. This calculation has not been validated in all clinical situations. eGFR's persistently <90 mL/min signify possible Chronic Kidney Disease.    Anion gap 13 5 - 15  Urinalysis, Routine w reflex microscopic     Status: Abnormal   Collection Time: 06/10/14  9:26 AM  Result Value Ref Range   Color, Urine YELLOW YELLOW   APPearance CLEAR CLEAR   Specific Gravity,  Urine 1.010 1.005 - 1.030   pH 7.0 5.0 - 8.0   Glucose, UA NEGATIVE NEGATIVE mg/dL   Hgb urine dipstick NEGATIVE NEGATIVE   Bilirubin Urine NEGATIVE NEGATIVE   Ketones, ur 40 (A) NEGATIVE mg/dL   Protein, ur NEGATIVE NEGATIVE mg/dL   Urobilinogen, UA 1.0 0.0 - 1.0 mg/dL   Nitrite NEGATIVE NEGATIVE   Leukocytes, UA NEGATIVE NEGATIVE    Comment: MICROSCOPIC NOT DONE ON URINES WITH NEGATIVE PROTEIN, BLOOD, LEUKOCYTES, NITRITE, OR GLUCOSE <1000 mg/dL.    Ct Head Wo Contrast  06/10/2014   CLINICAL DATA:  Numerous seizures since 06/09/2014. History of right frontal craniotomy.  EXAM: CT HEAD WITHOUT CONTRAST  TECHNIQUE: Contiguous axial images  were obtained from the base of the skull through the vertex without intravenous contrast.  COMPARISON:  02/26/2014; 02/23/2014; brain MRI - 02/26/2014  FINDINGS: Stable sequela of right frontal craniotomy.  The gray-white differentiation is maintained. No CT evidence of acute large territory infarct. No intraparenchymal or extra-axial mass or hemorrhage. Normal size and configuration of the ventricles and basilar cisterns. No midline shift. Limited visualization of the paranasal sinuses and mastoid air cells is normal. No air-fluid levels. Regional soft tissues appear normal. No displaced calvarial fracture.  IMPRESSION: 1. No acute intracranial process. 2. Stable sequela of prior right frontal craniotomy.   Electronically Signed   By: Sandi Mariscal M.D.   On: 06/10/2014 09:03   Dg Chest Port 1 View  06/10/2014   CLINICAL DATA:  Came into ER on 06/09/14 and family c/o seizures. Pt. Cannot keep anything down for past two days and seizures worsened today. Pt. Also has flu like symptoms.  EXAM: PORTABLE CHEST - 1 VIEW  COMPARISON:  03/05/2014  FINDINGS: Lungs are adequately inflated without consolidation or effusion. The cardiomediastinal silhouette is within normal. Remainder of the exam is unchanged.  IMPRESSION: No active disease.   Electronically Signed   By: Marin Olp M.D.   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 Mental status back to baseline, no focal findings on exam, and CT unremarkable for acute abnormality. She is afebrile with a normal white count. 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 Sent  CSF HSV PCR. Ordered MRI brain and EEG in am. Will continue to follow up.  Dorian Pod, MD 06/10/2014, 11:16 AM  [facility]

## 2014-06-10 NOTE — ED Notes (Signed)
Pt brought into ER with family with complaints of seizures since yesterday. Pt  has not been able to keep anything down for the past couple of days, as well as flu like symptoms. Seizures worsened today.

## 2014-06-10 NOTE — ED Notes (Signed)
[place], [place], [place], - [phone]

## 2014-06-10 NOTE — Progress Notes (Addendum)
ANTIBIOTIC CONSULT NOTE - INITIAL  Pharmacy Consult for acyclovir Indication: possible HSV encephalitis  Allergies  Allergen Reactions  . Contrast Media [Iodinated Diagnostic Agents] Rash and Shortness Of Breath  . Fentanyl Other (See Comments)    Witnessed grand tonic clonic seizure in ED while wearing patch (02/26/14); h/o seizure disorder  . Morphine Hives and Shortness Of Breath  . Shellfish Allergy Anaphylaxis and Hives  . Latex   . Penicillins Nausea And Vomiting    GI Intolerance  . Metrizamide Rash    Patient Measurements: Height: 5\' 4"  (162.6 cm) Weight: 163 lb (73.936 kg) IBW/kg (Calculated) : 54.7 Adjusted Body Weight: 62kg  Vital Signs: Temp: 99.1 F (37.3 C) (02/07 1001) Temp Source: Oral (02/07 1001) BP: 118/78 mmHg (02/07 1050) Pulse Rate: 61 (02/07 1050) Intake/Output from previous day:   Intake/Output from this shift:    Labs:  Recent Labs  06/10/14 0815  WBC 9.2  HGB 13.0  PLT 310  CREATININE 1.44*   Estimated Creatinine Clearance: 44 mL/min (by C-G formula based on Cr of 1.44). No results for input(s): VANCOTROUGH, VANCOPEAK, VANCORANDOM, GENTTROUGH, GENTPEAK, GENTRANDOM, TOBRATROUGH, TOBRAPEAK, TOBRARND, AMIKACINPEAK, AMIKACINTROU, AMIKACIN in the last 72 hours.   Microbiology: No results found for this or any previous visit (from the past 720 hour(s)).  Medical History: Past Medical History  Diagnosis Date  . Addison's disease   . Diabetes insipidus   . Pneumonia   . Seizures   . Brain tumor    Assessment: 13 YOF with history of seizures presents with seizures ongoing since yesterday. During exam, she mentions a lesion on left inner buttock, also has a sore in her mouth. She had an LP done at the bedside in ED. WBC wnl, tmax in ED only 100.8. SCr 1.44 with est CrCl 38mL/min.  Goal of Therapy:  proper dosing based on hepatic and renal function  Plan:  -acyclovir 545mg  (10mg /kg IBW) IV q12h  -follow LP results, clinical  progression, renal function  Ramina Hulet D. Terence Googe, PharmD, BCPS Clinical Pharmacist Pager: [phone] 06/10/2014 11:18 AM

## 2014-06-10 NOTE — ED Notes (Signed)
During in and out cath, pt complained of small lesion on left inner buttock. Lesion has small pustule top. Notified MD. Pt also complaining of sore in her mouth that is white. MD aware.

## 2014-06-10 NOTE — ED Notes (Signed)
PER PT AND HER CHILDREN, PT IS ABLE TO TOLERATE DILAUDID.

## 2014-06-10 NOTE — Progress Notes (Signed)
Paged Dr. Sloan Leiter at [phone]. Pt was actively vomiting but was not near time for her to get another dose of zofran. Dr. Sloan Leiter gave orders for IV phenergan. Administerd phenergan. Will continue to monitor pt.

## 2014-06-10 NOTE — ED Notes (Signed)
Advised pt's son, Dellis Filbert, pt has been assigned to a bed.

## 2014-06-10 NOTE — Progress Notes (Signed)
Patient vomiting 20cc dark brown liquid. Stated that phenergan was not helpful. Refused to take Zofran, stating that it makes her vomit even more. Bowel sounds hypoactive, abdomen soft, non distended, tender in all quadrants. Fredirick Maudlin, NP notified. New orders received for Reglan.

## 2014-06-10 NOTE — ED Provider Notes (Signed)
CSN: [id number]     Arrival date & time 06/10/14  0740 History   First MD Initiated Contact with Patient 06/10/14 0745     Chief Complaint  Patient presents with  . Seizures      HPI  Patient presents here with family for evaluation of possible seizure, and vomiting.  Patient admitted prolonged hospital stay in November with possible seizure. 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 Had normal EEG. Does continue on antiepileptics with Keppra. The patient report, and daughter and son report she has had vomiting for the last 2 days area and she called her daughter this morning. States she needed to go to the hospital. While on the phone with her daughter, the daughter states that her mom quit talking and she heard a "clicking sound". She states this is typically this and she will make with her mouth "when she is going to have a seizure". The line went dead. Daughter brought her mother's house and found her on the couch. Her brother, the patient's son had arrived as well. They were able to get her to the car. They state she was able to help them walk. However she had another "episode" where she was stiff and "shaking all over. They drove her from [facility] to [name].  No documented fever over the last few days. Has complained of cough and a sore throat. Complains of a sore on her tongue. Complains of genital sores. 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 No headache. No vision changes. No other unusual behavior over the last few days.  Past Medical History  Diagnosis Date  . Addison's disease   . Diabetes insipidus   . Pneumonia   . Seizures   . Brain tumor    Past Surgical History  Procedure Laterality Date  . Tonsillectomy    . Abdominal hysterectomy    . Brain surgery      crainotomy   History reviewed. No pertinent family history. History  Substance Use Topics  . Smoking status: Never Smoker   . Smokeless tobacco: Never  Used  . Alcohol Use: No   OB History    No data available     Review of Systems  Constitutional: Negative for fever, chills, diaphoresis, appetite change and fatigue.  HENT: Negative for mouth sores, sore throat and trouble swallowing.   Eyes: Negative for visual disturbance.  Respiratory: Negative for cough, chest tightness, shortness of breath and wheezing.   Cardiovascular: Negative for chest pain.  Gastrointestinal: Positive for nausea and vomiting. Negative for abdominal pain, diarrhea and abdominal distention.  Endocrine: Negative for polydipsia, polyphagia and polyuria.  Genitourinary: Positive for vaginal pain. Negative for dysuria, frequency and hematuria.       Sore" infected area" on her left labia.  Musculoskeletal: Negative for gait problem.  Skin: Negative for color change, pallor and rash.  Neurological: Positive for seizures. Negative for dizziness, syncope, light-headedness and headaches.  Hematological: Does not bruise/bleed easily.  Psychiatric/Behavioral: Negative for behavioral problems and confusion.      Allergies  Contrast media; Fentanyl; Morphine; Shellfish allergy; Penicillins; Latex; and Metrizamide  Home Medications   Prior to Admission medications   Medication Sig Start Date End Date Taking? Authorizing Provider  ALPRAZolam Duanne Moron) 1 MG tablet Take 1 mg by mouth 3 (three) times daily as needed for anxiety.    Yes Historical Provider, MD  azelastine (OPTIVAR) 0.05 % ophthalmic solution Place 1 drop into both eyes at bedtime.  Yes Historical Provider, MD  desmopressin (DDAVP) 0.01 % SOLN Place 1 spray into the nose 2 (two) times daily. 1 spray - 10 mcg 05/09/14  Yes Historical Provider, MD  fentaNYL (DURAGESIC - DOSED MCG/HR) 100 MCG/HR Place 100 mcg onto the skin every 3 (three) days.   Yes Historical Provider, MD  gabapentin (NEURONTIN) 300 MG capsule Take 300 mg by mouth 3 (three) times daily.   Yes Historical Provider, MD  hydrocortisone (CORTEF)  20 MG tablet Take 10-60 mg by mouth daily. Take 1 tablet (20 mg) every morning and 1/2 tablet (10 mg) every evening; may double or triple the dose in the event of stress or acute illness 06/06/14  Yes Historical Provider, MD  levETIRAcetam (KEPPRA) 1000 MG tablet Take 1,000 mg by mouth 2 (two) times daily.   Yes Historical Provider, MD  levothyroxine (SYNTHROID, LEVOTHROID) 100 MCG tablet Take 100 mcg by mouth daily before breakfast.   Yes Historical Provider, MD  mirtazapine (REMERON) 15 MG tablet Take 15 mg by mouth at bedtime.   Yes Historical Provider, MD  oxyCODONE (ROXICODONE) 15 MG immediate release tablet Take 15 mg by mouth 3 (three) times daily as needed for pain.   Yes Historical Provider, MD  potassium chloride (MICRO-K) 10 MEQ CR capsule Take 10 mEq by mouth 2 (two) times daily.   Yes Historical Provider, MD  promethazine (PHENERGAN) 25 MG tablet Take 25 mg by mouth every 6 (six) hours as needed for nausea or vomiting.    Yes Historical Provider, MD  tiZANidine (ZANAFLEX) 4 MG tablet Take 8 mg by mouth every 8 (eight) hours as needed for muscle spasms.   Yes Historical Provider, MD  hydrocortisone (CORTEF) 10 MG tablet Take 2 tablets in the morning, 1 in the evening. Patient not taking: Reported on 06/10/2014 03/13/14   Charlott Rakes, MD  methocarbamol (ROBAXIN) 500 MG tablet Take 1 tablet (500 mg total) by mouth 2 (two) times daily. Patient not taking: Reported on 06/11/2014 04/12/14   Alvina Chou, PA-C  oxyCODONE-acetaminophen (PERCOCET/ROXICET) 5-325 MG per tablet Take 2 tablets by mouth every 4 (four) hours as needed for moderate pain or severe pain. Patient not taking: Reported on 06/11/2014 04/12/14   Alvina Chou, PA-C   BP 97/53 mmHg  Pulse 58  Temp(Src) 98 F (36.7 C) (Axillary)  Resp 19  Ht 5\' 5"  (1.651 m)  Wt 160 lb 11.5 oz (72.9 kg)  BMI 26.74 kg/m2  SpO2 98% Physical Exam  Constitutional: She is oriented to person, place, and time. No distress.  Patient link  supine. Eyes fluttering up and down. Arms and legs tremoring. No organized tonic-clonic or seizure-like activity. All the movement resolves with gentle restraint to the extremity. Eyes not deviated. No tongue biting. No incontinence.  HENT:  Head: Normocephalic.  Mouth/Throat:    Eyes: Conjunctivae are normal. Pupils are equal, round, and reactive to light. No scleral icterus.  Symmetric and reactive  Neck: Normal range of motion. Neck supple. No thyromegaly present.  Neck supple  Cardiovascular: Normal rate and regular rhythm.  Exam reveals no gallop and no friction rub.   No murmur heard. Pulmonary/Chest: Effort normal and breath sounds normal. No respiratory distress. She has no wheezes. She has no rales.  Abdominal: Soft. Bowel sounds are normal. She exhibits no distension. There is no tenderness. There is no rebound.  Genitourinary:     Musculoskeletal: Normal range of motion.  Neurological: She is alert and oriented to person, place, and time.  Initially cannot  follow commands. After Ativan. Patient is able to speak and cooperate with exam and moves all 4 extremities without apparent weakness, asymmetry, or Todd's weakness.  Skin: Skin is warm and dry. No rash noted.  Psychiatric: She has a normal mood and affect. Her behavior is normal.    ED Course  LUMBAR PUNCTURE Date/Time: 06/10/2014 10:30 AM Performed by: Tanna Furry Authorized by: Tanna Furry Consent: Verbal consent obtained. Written consent obtained. Risks and benefits: risks, benefits and alternatives were discussed Consent given by: patient (Son, and daughter Mickle Mallory) Patient understanding: patient states understanding of the procedure being performed Patient consent: the patient's understanding of the procedure matches consent given Patient identity confirmed: verbally with patient and arm band Time out: Immediately prior to procedure a "time out" was called to verify the correct patient, procedure, equipment,  support staff and site/side marked as required. Indications: evaluation for infection Anesthesia: local infiltration Local anesthetic: lidocaine 1% without epinephrine Anesthetic total: 3 ml Patient sedated: yes Sedatives: propofol Sedation start date/time: 06/10/2014 10:30 AM Sedation end date/time: 06/10/2014 11:30 AM Vitals: Vital signs were monitored during sedation. Preparation: Patient was prepped and draped in the usual sterile fashion. Lumbar space: L3-L4 interspace Patient's position: left lateral decubitus Needle gauge: 22 Needle type: diamond point Needle length: 2.5 in Number of attempts: 1 Fluid appearance: clear Tubes of fluid: 4 Total volume: 6 ml Post-procedure: site cleaned and adhesive bandage applied Patient tolerance: Patient tolerated the procedure well with no immediate complications   (including critical care time) Labs Review Labs Reviewed  BASIC METABOLIC PANEL - Abnormal; Notable for the following:    Potassium 3.2 (*)    Glucose, Bld 118 (*)    Creatinine, Ser 1.44 (*)    GFR calc non Af Amer 40 (*)    GFR calc Af Amer 47 (*)    All other components within normal limits  URINALYSIS, ROUTINE W REFLEX MICROSCOPIC - Abnormal; Notable for the following:    Ketones, ur 40 (*)    All other components within normal limits  CSF CELL COUNT WITH DIFFERENTIAL - Abnormal; Notable for the following:    RBC Count, CSF 6 (*)    All other components within normal limits  CSF CELL COUNT WITH DIFFERENTIAL - Abnormal; Notable for the following:    RBC Count, CSF 1 (*)    All other components within normal limits  BASIC METABOLIC PANEL - Abnormal; Notable for the following:    Potassium 3.1 (*)    BUN <5 (*)    GFR calc non Af Amer 56 (*)    GFR calc Af Amer 65 (*)    All other components within normal limits  CBC - Abnormal; Notable for the following:    HCT 34.5 (*)    All other components within normal limits  CBC - Abnormal; Notable for the following:     Hemoglobin 11.3 (*)    HCT 34.0 (*)    All other components within normal limits  BASIC METABOLIC PANEL - Abnormal; Notable for the following:    CO2 18 (*)    GFR calc non Af Amer 57 (*)    GFR calc Af Amer 66 (*)    All other components within normal limits  CBG MONITORING, ED - Abnormal; Notable for the following:    Glucose-Capillary 112 (*)    All other components within normal limits  URINE CULTURE  CSF CULTURE  GRAM STAIN  MRSA PCR SCREENING  RESPIRATORY VIRUS PANEL  HERPES SIMPLEX VIRUS  CULTURE  CULTURE, BLOOD (ROUTINE X 2)  CULTURE, BLOOD (ROUTINE X 2)  CBC WITH DIFFERENTIAL/PLATELET  GLUCOSE, CSF  PROTEIN, CSF  HERPES SIMPLEX VIRUS(HSV) DNA BY PCR  INFLUENZA PANEL BY PCR (TYPE A & B, H1N1)  VDRL, CSF    Imaging Review Ct Head Wo Contrast  06/10/2014   CLINICAL DATA:  Numerous seizures since 06/09/2014. History of right frontal craniotomy.  EXAM: CT HEAD WITHOUT CONTRAST  TECHNIQUE: Contiguous axial images were obtained from the base of the skull through the vertex without intravenous contrast.  COMPARISON:  02/26/2014; 02/23/2014; brain MRI - 02/26/2014  FINDINGS: Stable sequela of right frontal craniotomy.  The gray-white differentiation is maintained. No CT evidence of acute large territory infarct. No intraparenchymal or extra-axial mass or hemorrhage. Normal size and configuration of the ventricles and basilar cisterns. No midline shift. Limited visualization of the paranasal sinuses and mastoid air cells is normal. No air-fluid levels. Regional soft tissues appear normal. No displaced calvarial fracture.  IMPRESSION: 1. No acute intracranial process. 2. Stable sequela of prior right frontal craniotomy.   Electronically Signed   By: Sandi Mariscal M.D.   On: 06/10/2014 09:03   Mr Brain Wo Contrast  06/11/2014   CLINICAL DATA:  Initial valuation for seizure, with fever. Also with receded recent herpetic outbreak. Concern for possible intracranial infection.  EXAM: MRI HEAD  WITHOUT CONTRAST  TECHNIQUE: Multiplanar, multiecho pulse sequences of the brain and surrounding structures were obtained without intravenous contrast.  COMPARISON:  Prior CT from 06/10/2014 as well as previous MRI from 02/26/2014.  FINDINGS: Cerebral volume within normal limits for patient age. No significant white matter changes identified.  No focal parenchymal signal abnormality. No evidence for infarct or hemorrhage. Gray-white matter differentiation maintained. Normal intravascular flow voids present.  Hippocampi are normal in morphology with normal signal intensity bilaterally. No signal abnormality to suggest herpes encephalitis or other active infection.  Cerebellar tonsils are minimally low lying, by within normal limits. Pituitary grossly unremarkable on this non pituitary protocol MRI. No acute abnormality seen about the orbits. Mild exophthalmos noted.  Paranasal sinuses are clear.  No mastoid effusion.  Postoperative changes from prior right frontal craniotomy. Calvarium otherwise unremarkable. Scalp soft tissues within normal limits.  IMPRESSION: 1. No acute intracranial process identified. No signal changes to suggest active intracranial infection identified. 2. Prior right frontal craniotomy.   Electronically Signed   By: Jeannine Boga M.D.   On: 06/11/2014 06:24   Dg Chest Port 1 View  06/10/2014   CLINICAL DATA:  Came into ER on 06/09/14 and family c/o seizures. Pt. Cannot keep anything down for past two days and seizures worsened today. Pt. Also has flu like symptoms.  EXAM: PORTABLE CHEST - 1 VIEW  COMPARISON:  03/05/2014  FINDINGS: Lungs are adequately inflated without consolidation or effusion. The cardiomediastinal silhouette is within normal. Remainder of the exam is unchanged.  IMPRESSION: No active disease.   Electronically Signed   By: Marin Olp M.D.   On: 06/10/2014 08:56   Dg Abd Portable 2v  06/10/2014   CLINICAL DATA:  Persistent vomiting and abdominal pain. Lower  abdominal pain.  EXAM: PORTABLE ABDOMEN - 2 VIEW  COMPARISON:  MRI abdomen 03/11/2014  FINDINGS: The bowel gas pattern is normal. There is no evidence of free air. Surgical clips. No radio-opaque calculi or other significant radiographic abnormality is seen.  IMPRESSION: Negative.   Electronically Signed   By: Lucienne Capers M.D.   On: 06/10/2014 21:14  EKG Interpretation None      Angiocath insertion Performed by: Lolita Patella  Consent: Verbal consent obtained. Risks and benefits: risks, benefits and alternatives were discussed Time out: Immediately prior to procedure a "time out" was called to verify the correct patient, procedure, equipment, support staff and site/side marked as required.  Preparation: Patient was prepped and draped in the usual sterile fashion.  Vein Location: Lt AC medial  YesUltrasound Guided  Gauge: 20  Normal blood return and flush without difficulty Patient tolerance: Patient tolerated the procedure well with no immediate complications.   Preprocedure  Pre-anesthesia/induction confirmation of laterality/correct procedure site including "time-out."  Provider confirms review of the nurses' note, allergies, medications, pertinent labs, PMH, pre-induction vital signs, pulse oximetry, pain level, and ECG (as applicable), and patient condition satisfactory for commencing with order for sedation and procedure.  Procedural sedation Performed by: Lolita Patella Consent: Verbal consent obtained. Risks and benefits: risks, benefits and alternatives were discussed Required items: required blood products, implants, devices, and special equipment available Patient identity confirmed: arm band and provided demographic data Time out: Immediately prior to procedure a "time out" was called to verify the correct patient, procedure, equipment, support staff and site/side marked as required.  Sedation type: moderate (conscious) sedation NPO time confirmed  and considedered  Sedatives: PROPOFOL  Physician Time at Bedside: 30 minutes Start:  10:30.  Stop:  11:10  Vitals: Vital signs were monitored during sedation. Cardiac Monitor, pulse oximeter Patient tolerance: Patient tolerated the procedure well with no immediate complications. Comments: Pt with uneventful recovered. Returned to pre-procedural sedation baseline      MDM   Final diagnoses:  Seizure  Cough      CSF results do not suggest meningitis or encephalitis. Patient seen by neurology. Low-dose Keppra here. Discussed with hospitalist. Will be admitted. After lumbar puncture, and swab of her vesicular perivaginal lesion started on IV acyclovir.  Tanna Furry, MD 06/12/14 [phone]

## 2014-06-10 NOTE — ED Notes (Signed)
Pt wheeled to room having seizure. Pt eyes open. Placed pt in bed, pt breathing, rolling around, shaking. Pt airway patent and O2 sats 100% on room air. Pt mumbling words at times. Pt in and out of "seizing" and able to state her name. Pt is not incontinent. MD at bedside. Pt did not bite tongue. Pt is able to talk post ictal.

## 2014-06-10 NOTE — H&P (Signed)
PATIENT DETAILS Name: Shelly Le Age: 54 y.o. Sex: female Date of Birth: Sep 25, 1960 Admit Date: 06/10/2014 [id number], Deatra Canter, MD   CHIEF COMPLAINT:  Worsening seizures Fever with headache since this past Wednesday  HPI: Shelly Le is a 54 y.o. 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 Please note, patient still is somewhat confused ,anxious and is limited in participating in the history taking process-no family at the bedside-most of this history is obtained after talking with the patient, and reviewing ED notes. Apparently since this past Wednesday (06/06/14), patient has been having intermittent fever. The day before yesterday she started having tingling and numbness of her bilateral upper extremities. Since yesterday she's been having persistent nausea with vomiting. She claims she has not been able to keep anything down at all. Patient was on the phone with her daughter earlier today, when the patient's daughter heard a "clicking sound", per the patient's daughter and this is typical of when she has seizures. When the daughter arrived at mom's place, patient was lying in the couch and was very lethargic, they got into the car, and drove her from [facility] to [facility]. Apart from the above noted complaints, patient also complains of "burning sensation" in the mouth, and complains of a sore in the left lateral tongue area. Patient has a history of genital herpes and also complains of some genital sores. Upon arrival to the emergency room, patient was noted to be anxious, rolling around and shaking. Although, there were no obvious seizure-like movements, because of a history of fever, headache and possible worsening seizures, patient underwent a lumbar puncture. During my evaluation, patient appeared restless,  somewhat mumbling but was able to answer some of my questions appropriately. She denied any ongoing headache, denied any chest pain or shortness of breath. Denied any dysuria. There is also no history of diarrhea   ALLERGIES:   Allergies  Allergen Reactions  . Contrast Media [Iodinated Diagnostic Agents] Rash and Shortness Of Breath  . Fentanyl Other (See Comments)    Witnessed grand tonic clonic seizure in ED while wearing patch (02/26/14); h/o seizure disorder  . Morphine Hives and Shortness Of Breath  . Shellfish Allergy Anaphylaxis and Hives  . Latex   . Penicillins Nausea And Vomiting    GI Intolerance  . Metrizamide Rash    PAST MEDICAL HISTORY: Past Medical History  Diagnosis Date  . Addison's disease   . Diabetes insipidus   . Pneumonia   . Seizures   . Brain tumor     PAST SURGICAL HISTORY: Past Surgical History  Procedure Laterality Date  . Tonsillectomy    . Abdominal hysterectomy    . Brain surgery      crainotomy    MEDICATIONS AT HOME: Prior to Admission medications   Medication Sig Start Date End Date Taking? Authorizing Provider  desmopressin (DDAVP) 0.01 % nasal solution Place 10 mcg into the nose 2 (two) times daily.    Historical Provider, MD  gabapentin (NEURONTIN) 300 MG capsule Take 300 mg by mouth 3 (three) times daily.    Historical Provider, MD  hydrocortisone (CORTEF) 10 MG tablet Take 2 tablets in the morning, 1 in the evening. Patient taking differently: Take 10-20 mg by mouth daily. Take 2 tablets in the morning, 1 in the evening. 03/13/14  Charlott Rakes, MD  levETIRAcetam (KEPPRA) 750 MG tablet Take 2 tablets (1,500 mg total) by mouth 2 (two) times daily. 03/06/14   Charlott Rakes, MD  levothyroxine (SYNTHROID, LEVOTHROID) 100 MCG tablet Take 100 mcg by mouth daily before breakfast.    Historical Provider, MD  methocarbamol (ROBAXIN) 500 MG tablet Take 1 tablet (500 mg total) by mouth 2 (two) times daily. 04/12/14   Kaitlyn Szekalski, PA-C    mirtazapine (REMERON) 15 MG tablet Take 15 mg by mouth at bedtime.    Historical Provider, MD  olopatadine (PATANOL) 0.1 % ophthalmic solution Place 1 drop into both eyes 2 (two) times daily.    Historical Provider, MD  oxyCODONE-acetaminophen (PERCOCET/ROXICET) 5-325 MG per tablet Take 2 tablets by mouth every 4 (four) hours as needed for moderate pain or severe pain. 04/12/14   Kaitlyn Szekalski, PA-C  potassium chloride (K-DUR) 10 MEQ tablet Take 10 mEq by mouth 2 (two) times daily.    Historical Provider, MD  promethazine (PHENERGAN) 25 MG tablet Take 25 mg by mouth every 6 (six) hours as needed for nausea or vomiting (nausea & vomiting).    Historical Provider, MD  tiZANidine (ZANAFLEX) 4 MG tablet Take 8 mg by mouth every 8 (eight) hours as needed for muscle spasms.    Historical Provider, MD    FAMILY HISTORY: No family history of CAD  SOCIAL HISTORY:  reports that she has never smoked. She has never used smokeless tobacco. She reports that she does not drink alcohol or use illicit drugs.  REVIEW OF SYSTEMS:  Constitutional:   No  weight loss,  fatigue.  HEENT:    No  Difficulty swallowing,Tooth/dental problems  Cardio-vascular: No chest pain,  Orthopnea, PND, swelling in lower extremities, anasarca  GI:  No heartburn, indigestion, abdominal pain  Resp: No shortness of breath with exertion or at rest.  No excess mucus, no productive cough, No non-productive cough,  No coughing up of blood.No change in color of mucus.No wheezing.No chest wall deformity  Skin:  no rash .  GU:  no dysuria, change in color of urine, no urgency or frequency.  No flank pain.  Musculoskeletal: No joint pain or swelling.  No decreased range of motion.  No back pain.  Psych: No change in mood or affect.No memory loss.   PHYSICAL EXAM: Blood pressure 130/70, pulse 60, temperature 99.1 F (37.3 C), temperature source Oral, resp. rate 18, height 5\' 4"  (1.626 m), weight 73.936 kg (163 lb),  SpO2 100 %.  General appearance: A lethargic, she is mostly awake, follows some commands, answers most of my questions appropriately. Speech is slow, mumbles at times but mostly fluent. HEENT: Atraumatic and Normocephalic, pupils equally reactive to light and accomodation Neck: supple, no JVD. No cervical lymphadenopathy.  Chest:Good air entry bilaterally, no added sounds  CVS: S1 S2 regular, no murmurs.  Abdomen: Bowel sounds present, Non tender and not distended with no gaurding, rigidity or rebound. Extremities: B/L Lower Ext shows no edema, both legs are warm to touch Neurology: Awake alert, and oriented X 3, CN II-XII intact, Non focal Skin:No Rash Wounds:N/A  LABS ON ADMISSION:   Recent Labs  06/10/14 0815  NA 139  K 3.2*  CL 100  CO2 26  GLUCOSE 118*  BUN 8  CREATININE 1.44*  CALCIUM 9.6   No results for input(s): AST, ALT, ALKPHOS, BILITOT, PROT, ALBUMIN in the last 72 hours. No results for input(s): LIPASE, AMYLASE in the last 72 hours.  Recent Labs  06/10/14  0815  WBC 9.2  NEUTROABS 7.0  HGB 13.0  HCT 37.8  MCV 78.1  PLT 310   No results for input(s): CKTOTAL, CKMB, CKMBINDEX, TROPONINI in the last 72 hours. No results for input(s): DDIMER in the last 72 hours. Invalid input(s): POCBNP   RADIOLOGIC STUDIES ON ADMISSION: Ct Head Wo Contrast  06/10/2014   CLINICAL DATA:  Numerous seizures since 06/09/2014. History of right frontal craniotomy.  EXAM: CT HEAD WITHOUT CONTRAST  TECHNIQUE: Contiguous axial images were obtained from the base of the skull through the vertex without intravenous contrast.  COMPARISON:  02/26/2014; 02/23/2014; brain MRI - 02/26/2014  FINDINGS: Stable sequela of right frontal craniotomy.  The gray-white differentiation is maintained. No CT evidence of acute large territory infarct. No intraparenchymal or extra-axial mass or hemorrhage. Normal size and configuration of the ventricles and basilar cisterns. No midline shift. Limited  visualization of the paranasal sinuses and mastoid air cells is normal. No air-fluid levels. Regional soft tissues appear normal. No displaced calvarial fracture.  IMPRESSION: 1. No acute intracranial process. 2. Stable sequela of prior right frontal craniotomy.   Electronically Signed   By: Sandi Mariscal M.D.   On: 06/10/2014 09:03   Dg Chest Port 1 View  06/10/2014   CLINICAL DATA:  Came into ER on 06/09/14 and family c/o seizures. Pt. Cannot keep anything down for past two days and seizures worsened today. Pt. Also has flu like symptoms.  EXAM: PORTABLE CHEST - 1 VIEW  COMPARISON:  03/05/2014  FINDINGS: Lungs are adequately inflated without consolidation or effusion. The cardiomediastinal silhouette is within normal. Remainder of the exam is unchanged.  IMPRESSION: No active disease.   Electronically Signed   By: Marin Olp M.D.   On: 06/10/2014 08:56     EKG: Independently reviewed. Normal sinus rhythm  ASSESSMENT AND PLAN: Present on Admission:  .SIRS (systemic inflammatory response syndrome): Presenting with fever, headache, nausea with vomiting and some tachycardia. No foci of infection evident as of now, UA negative for UTI, chest x-ray negative for pneumonia. LP done and CSF without any evidence of meningitis/encephalitis. Not sure if this is a viral syndrome at this time. Will continue with empiric acyclovir for now, await culture data and further CSF studies-HSV PCR. Supportive care with IV fluids.   .? Recurrent/breakthrough seizures: Not sure these tremors/"shaky" are consistent with seizures-I doubt. We will continue with Keppra. Await further LP/CSF results, have ordered MRI brain and EEG as recommended by neurology. Will follow closely monitor in step down unit.  . ARF (acute renal failure): Likely prerenal azotemia given history of recurrent nausea and vomiting. Hydrate and recheck electrolytes in a.m.  . Nausea with vomiting: Likely secondary to SIRS. Abdomen appears very benign,  supportive care, and see if able to tolerate diet. If not, we will need further workup.  . 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 Follow.   . DI (diabetes insipidus): Continue with DDAVP, when able to safely tolerate oral intake, will need to discontinue IV fluids.  . Adjustment disorder with mixed emotional features: Supportive care for now, once medical issues are stable and if no documented evidence of infection/sepsis/culture negative-and if MRI brain/EEG negative -may need psychiatric evaluation   Further plan will depend as patient's clinical course evolves and further radiologic and laboratory data become available. Patient will be monitored closely.  DVT Prophylaxis: Prophylactic  Heparin  Code Status: Full Code  Disposition Plan:  Home in next 2-3 days when workup complete    Total time spent for admission equals 45 minutes.  [facility] Pager [phone]  If 7PM-7AM, please contact night-coverage [email or web address] Password [email or web address] 06/10/2014, 1:18 PM

## 2014-06-10 NOTE — ED Notes (Signed)
CBG 112.  

## 2014-06-10 NOTE — ED Notes (Signed)
"  Stopped" Diprivan infusing in computer - pt was not on drip when arrived to Pod  C. Per Shirline Frees and Dreama, RN and Diprivan was stopped at 1042.

## 2014-06-10 NOTE — ED Notes (Signed)
Pt completely alert. Pt's family at bedside. Pt resting

## 2014-06-10 NOTE — Sedation Documentation (Signed)
MD at bedside. Lumbar puncture tray at bedside. Propofol given

## 2014-06-10 NOTE — ED Notes (Signed)
MD informed pt and pt's son about needing to do lumbar puncture. Obtained consent for procedure. Pt is resting calmly in bed. Pt A/Ox4

## 2014-06-10 NOTE — Sedation Documentation (Signed)
Pt A/O X's 4 during procedure.

## 2014-06-10 NOTE — Sedation Documentation (Signed)
MD done with Lumbar puncture.

## 2014-06-10 NOTE — ED Notes (Signed)
Attempted to call report - was advised RN will need to return call in 15-20 min.

## 2014-06-10 NOTE — ED Notes (Addendum)
MD at bedside. MD obtained ultrasound guided IV. Gave a total of 4 mg of ativan.

## 2014-06-11 ENCOUNTER — Inpatient Hospital Stay (HOSPITAL_COMMUNITY): Payer: Medicare Other

## 2014-06-11 DIAGNOSIS — E232 Diabetes insipidus: Secondary | ICD-10-CM

## 2014-06-11 DIAGNOSIS — D352 Benign neoplasm of pituitary gland: Secondary | ICD-10-CM

## 2014-06-11 LAB — CBC
HEMATOCRIT: 34.5 % — AB (ref 36.0–46.0)
Hemoglobin: 12 g/dL (ref 12.0–15.0)
MCH: 27.2 pg (ref 26.0–34.0)
MCHC: 34.8 g/dL (ref 30.0–36.0)
MCV: 78.2 fL (ref 78.0–100.0)
Platelets: 208 10*3/uL (ref 150–400)
RBC: 4.41 MIL/uL (ref 3.87–5.11)
RDW: 13 % (ref 11.5–15.5)
WBC: 7.5 10*3/uL (ref 4.0–10.5)

## 2014-06-11 LAB — BASIC METABOLIC PANEL
Anion gap: 10 (ref 5–15)
BUN: <5 mg/dL — ABNORMAL LOW (ref 6–23)
CHLORIDE: 111 mmol/L (ref 96–112)
CO2: 21 mmol/L (ref 19–32)
Calcium: 8.4 mg/dL (ref 8.4–10.5)
Creatinine, Ser: 1.09 mg/dL (ref 0.50–1.10)
GFR calc non Af Amer: 56 mL/min — ABNORMAL LOW (ref >90)
GFR, EST AFRICAN AMERICAN: 65 mL/min — AB (ref >90)
Glucose, Bld: 90 mg/dL (ref 70–99)
Potassium: 3.1 mmol/L — ABNORMAL LOW (ref 3.5–5.1)
Sodium: 142 mmol/L (ref 135–145)

## 2014-06-11 LAB — HERPES SIMPLEX VIRUS(HSV) DNA BY PCR
HSV 1 DNA: NOT DETECTED
HSV 2 DNA: NOT DETECTED

## 2014-06-11 MED ORDER — POTASSIUM CHLORIDE CRYS ER 20 MEQ PO TBCR
40.0000 meq | EXTENDED_RELEASE_TABLET | Freq: Once | ORAL | Status: AC
  Administered 2014-06-11: 40 meq via ORAL
  Filled 2014-06-11: qty 2

## 2014-06-11 MED ORDER — WHITE PETROLATUM GEL
Status: AC
  Administered 2014-06-11: 0.2
  Filled 2014-06-11: qty 1

## 2014-06-11 MED ORDER — POTASSIUM CHLORIDE 10 MEQ/100ML IV SOLN
10.0000 meq | Freq: Once | INTRAVENOUS | Status: AC
  Administered 2014-06-11: 10 meq via INTRAVENOUS
  Filled 2014-06-11: qty 100

## 2014-06-11 MED ORDER — HYDROMORPHONE HCL 1 MG/ML IJ SOLN
0.5000 mg | INTRAMUSCULAR | Status: DC | PRN
  Administered 2014-06-11 – 2014-06-12 (×4): 0.5 mg via INTRAVENOUS
  Filled 2014-06-11 (×4): qty 1

## 2014-06-11 MED ORDER — POTASSIUM CHLORIDE 10 MEQ/100ML IV SOLN
10.0000 meq | INTRAVENOUS | Status: AC
  Administered 2014-06-11 (×4): 10 meq via INTRAVENOUS
  Filled 2014-06-11 (×4): qty 100

## 2014-06-11 NOTE — Progress Notes (Signed)
Routine EEG completed. Results pending. 

## 2014-06-11 NOTE — Progress Notes (Signed)
Subjective: No overnight events. Patient reports nausea and emesis. Temp max 100.3 overnight. MRI brain imaging reviewed and no acute process. CSF unremarkable. Awaiting HSV PCR  Objective: Current vital signs: BP 118/66 mmHg  Pulse 79  Temp(Src) 99 F (37.2 C) (Axillary)  Resp 15  Ht 5\' 5"  (1.651 m)  Wt 72.9 kg (160 lb 11.5 oz)  BMI 26.74 kg/m2  SpO2 98% Vital signs in last 24 hours: Temp:  [98.7 F (37.1 C)-100.3 F (37.9 C)] 99 F (37.2 C) (02/08 0700) Pulse Rate:  [53-87] 79 (02/08 0719) Resp:  [11-26] 15 (02/08 0719) BP: (101-154)/(54-87) 118/66 mmHg (02/08 0719) SpO2:  [97 %-100 %] 98 % (02/08 0719) Weight:  [72.9 kg (160 lb 11.5 oz)] 72.9 kg (160 lb 11.5 oz) (02/07 1715)  Intake/Output from previous day: 02/07 0701 - 02/08 0700 In: 1586 [I.V.:1375; IV Piggyback:211] Out: 730 [Urine:650; Emesis/NG output:80] Intake/Output this shift: Total I/O In: 100 [I.V.:100] Out: -  Nutritional status: Diet NPO time specified Except for: Sips with Meds  Neurologic Exam: Mental Status: Alert, oriented, thought content appropriate. Speech fluent without evidence of aphasia.  Cranial Nerves: II:  Visual fields grossly normal, pupils equal, round, reactive to light III,IV, VI: ptosis not present, extra-ocular motions intact bilaterally V,VII: smile symmetric, facial light touch sensation normal bilaterally VIII: hearing normal bilaterally Motor: Moves all extremities spontaneously and symmetrically. Tone and bulk:normal tone throughout; no atrophy noted Sensory: light touch intact throughout, bilaterally  Lab Results: Basic Metabolic Panel:  Recent Labs Lab 06/10/14 0815 06/11/14 0300  NA 139 142  K 3.2* 3.1*  CL 100 111  CO2 26 21  GLUCOSE 118* 90  BUN 8 <5*  CREATININE 1.44* 1.09  CALCIUM 9.6 8.4    Liver Function Tests: No results for input(s): AST, ALT, ALKPHOS, BILITOT, PROT, ALBUMIN in the last 168 hours. No results for input(s): LIPASE, AMYLASE in the  last 168 hours. No results for input(s): AMMONIA in the last 168 hours.  CBC:  Recent Labs Lab 06/10/14 0815 06/11/14 0300  WBC 9.2 7.5  NEUTROABS 7.0  --   HGB 13.0 12.0  HCT 37.8 34.5*  MCV 78.1 78.2  PLT 310 208    Cardiac Enzymes: No results for input(s): CKTOTAL, CKMB, CKMBINDEX, TROPONINI in the last 168 hours.  Lipid Panel: No results for input(s): CHOL, TRIG, HDL, CHOLHDL, VLDL, LDLCALC in the last 168 hours.  CBG:  Recent Labs Lab 06/10/14 0757  GLUCAP 112*    Microbiology: Results for orders placed or performed during the hospital encounter of 06/10/14  CSF culture     Status: None (Preliminary result)   Collection Time: 06/10/14 11:03 AM  Result Value Ref Range Status   Specimen Description CSF  Final   Special Requests Normal  Final   Gram Stain   Final    WBC PRESENT, PREDOMINANTLY MONONUCLEAR NO ORGANISMS SEEN CYTOSPIN Performed at [name] Performed at [place] Performed at [facility]   Final   Report Status PENDING  Incomplete  Gram stain     Status: None   Collection Time: 06/10/14 11:03 AM  Result Value Ref Range Status   Specimen Description CSF  Final   Special Requests NONE  Final   Gram Stain   Final    CYTOSPIN SLIDE WBC PRESENT, PREDOMINANTLY MONONUCLEAR NO ORGANISMS SEEN    Report Status 06/10/2014 FINAL  Final  MRSA PCR Screening     Status: None  Collection Time: 06/10/14  6:05 PM  Result Value Ref Range Status   MRSA by PCR NEGATIVE NEGATIVE Final    Comment:        The GeneXpert MRSA Assay (FDA approved for NASAL specimens only), is one component of a comprehensive MRSA colonization surveillance program. It is not intended to diagnose MRSA infection nor to guide or monitor treatment for MRSA infections.     Coagulation Studies: No results for input(s): LABPROT, INR in the last 72 hours.  Imaging: Ct Head Wo Contrast  06/10/2014   CLINICAL DATA:  Numerous  seizures since 06/09/2014. History of right frontal craniotomy.  EXAM: CT HEAD WITHOUT CONTRAST  TECHNIQUE: Contiguous axial images were obtained from the base of the skull through the vertex without intravenous contrast.  COMPARISON:  02/26/2014; 02/23/2014; brain MRI - 02/26/2014  FINDINGS: Stable sequela of right frontal craniotomy.  The gray-white differentiation is maintained. No CT evidence of acute large territory infarct. No intraparenchymal or extra-axial mass or hemorrhage. Normal size and configuration of the ventricles and basilar cisterns. No midline shift. Limited visualization of the paranasal sinuses and mastoid air cells is normal. No air-fluid levels. Regional soft tissues appear normal. No displaced calvarial fracture.  IMPRESSION: 1. No acute intracranial process. 2. Stable sequela of prior right frontal craniotomy.   Electronically Signed   By: Sandi Mariscal M.D.   On: 06/10/2014 09:03   Mr Brain Wo Contrast  06/11/2014   CLINICAL DATA:  Initial valuation for seizure, with fever. Also with receded recent herpetic outbreak. Concern for possible intracranial infection.  EXAM: MRI HEAD WITHOUT CONTRAST  TECHNIQUE: Multiplanar, multiecho pulse sequences of the brain and surrounding structures were obtained without intravenous contrast.  COMPARISON:  Prior CT from 06/10/2014 as well as previous MRI from 02/26/2014.  FINDINGS: Cerebral volume within normal limits for patient age. No significant white matter changes identified.  No focal parenchymal signal abnormality. No evidence for infarct or hemorrhage. Gray-white matter differentiation maintained. Normal intravascular flow voids present.  Hippocampi are normal in morphology with normal signal intensity bilaterally. No signal abnormality to suggest herpes encephalitis or other active infection.  Cerebellar tonsils are minimally low lying, by within normal limits. Pituitary grossly unremarkable on this non pituitary protocol MRI. No acute  abnormality seen about the orbits. Mild exophthalmos noted.  Paranasal sinuses are clear.  No mastoid effusion.  Postoperative changes from prior right frontal craniotomy. Calvarium otherwise unremarkable. Scalp soft tissues within normal limits.  IMPRESSION: 1. No acute intracranial process identified. No signal changes to suggest active intracranial infection identified. 2. Prior right frontal craniotomy.   Electronically Signed   By: Jeannine Boga M.D.   On: 06/11/2014 06:24   Dg Chest Port 1 View  06/10/2014   CLINICAL DATA:  Came into ER on 06/09/14 and family c/o seizures. Pt. Cannot keep anything down for past two days and seizures worsened today. Pt. Also has flu like symptoms.  EXAM: PORTABLE CHEST - 1 VIEW  COMPARISON:  03/05/2014  FINDINGS: Lungs are adequately inflated without consolidation or effusion. The cardiomediastinal silhouette is within normal. Remainder of the exam is unchanged.  IMPRESSION: No active disease.   Electronically Signed   By: Marin Olp M.D.   On: 06/10/2014 08:56   Dg Abd Portable 2v  06/10/2014   CLINICAL DATA:  Persistent vomiting and abdominal pain. Lower abdominal pain.  EXAM: PORTABLE ABDOMEN - 2 VIEW  COMPARISON:  MRI abdomen 03/11/2014  FINDINGS: The bowel gas pattern is normal. There is  no evidence of free air. Surgical clips. No radio-opaque calculi or other significant radiographic abnormality is seen.  IMPRESSION: Negative.   Electronically Signed   By: Lucienne Capers M.D.   On: 06/10/2014 21:14    Medications:  Scheduled: . acyclovir  10 mg/kg (Order-Specific) Intravenous Q12H  . desmopressin  10 mcg Nasal BID  . gabapentin  300 mg Oral TID  . heparin  5,000 Units Subcutaneous 3 times per day  . hydrocortisone sod succinate (SOLU-CORTEF) inj  50 mg Intravenous Q8H  . levETIRAcetam  1,500 mg Intravenous Q12H  . levothyroxine  50 mcg Intravenous Daily  . methocarbamol  500 mg Oral BID  . mirtazapine  15 mg Oral QHS  . potassium chloride  10  mEq Oral BID  . potassium chloride  10 mEq Intravenous Q1 Hr x 5    Assessment/Plan: 54 y/o with a history of seizures that apparently developed after pituitary adenoma resection > 10 years ago, comes in with seizures since yesterday, HA, fever 101 past Friday, and noted to have flare up of herpetic lesions. Mental status back to baseline. Breakthrough seizures likely related to underlying infectious etiology of unclear origin. CSF unremarkable so unlikely to be HSV encephalitis but will continue acyclovir until HSV PCR returns.   -continue acylovir, if HSV PCR negative then can d/c -EEG pending -contiue keppra 1500mg  BID -will continue to follow    LOS: 1 day   Jim Like, DO [facility] [phone]  If 7pm- 7am, please page neurology on call as listed in AMION. 06/11/2014  8:56 AM

## 2014-06-11 NOTE — Procedures (Signed)
History: 54 year old female with a history of seizures since a pituitary resection 10 years ago.  Sedation: None  Technique: This is a 17 channel routine scalp EEG performed at the bedside with bipolar and monopolar montages arranged in accordance to the international 10/20 system of electrode placement. One channel was dedicated to EKG recording.    Background: There is a well defined posterior dominant rhythm of 9 Hz that attenuates with eye opening. The predominance of this EEG is recorded during sleep and drowsiness. During periods of maximal wakefulness, however, the patient does have a background of intermixed alpha and beta activities. There is anterior shifting of the posterior dominant rhythm with drowsiness, and sleep structures are recorded. There is a prominence of faster frequencies in the right frontotemporal region consistent with a breach rhythm.  Photic stimulation: Physiologic driving is performed  EEG Abnormalities: None  Clinical Interpretation: This normal EEG is recorded in the waking and sleep state. There was no seizure or seizure predisposition recorded on this study.   Roland Rack, MD [facility] [phone]  If 7pm- 7am, please page neurology on call as listed in [place].

## 2014-06-11 NOTE — Progress Notes (Signed)
INITIAL NUTRITION ASSESSMENT  DOCUMENTATION CODES Per approved criteria  -Not Applicable   INTERVENTION: -Once diet is advanced, recommend [place] BID providing 250 kcal and 9 g protein -Monitor diet advancement and PO intake  NUTRITION DIAGNOSIS: Inadequate oral intake related to inability to eat as evidenced by NPO status.   Goal: Pt to meet >/= 90% of estimated needs  Monitor:  Pt PO advancement once diet advanced, weight trends, labs  Reason for Assessment: MST = 3  54 y.o. female  Admitting Dx: Adjustment disorder with mixed emotional features  ASSESSMENT: Pt admitted with worsening seizures, fever and headache for the past few days PTA.  PMH significant for pituitary adenoma resection, diabetes insipidus, seizures, Addison's disease and a past brain tumor in 2000.  Pt found to have SIRS (systemic inflammatory response syndrome), Seizures, ARF.   Pt was in distress, still experiencing severe nausea.  Pt reports she was eating normally prior to the nausea and vomiting, pt also reports she has not been able to taste since the removal of her brain tumor in 2000.  She tries to eat 3 meals a day at home.  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  Recommend ordering once diet advanced.    Nutrition Focus Physical Exam not performed due to pt distress and nausea.   Height: Ht Readings from Last 1 Encounters:  06/10/14 5\' 5"  (1.651 m)    Weight: Wt Readings from Last 1 Encounters:  06/10/14 160 lb 11.5 oz (72.9 kg)    Ideal Body Weight: 125  % Ideal Body Weight: 128%  Wt Readings from Last 10 Encounters:  06/10/14 160 lb 11.5 oz (72.9 kg)  04/12/14 155 lb (70.308 kg)  03/07/14 150 lb (68.04 kg)  03/02/14 169 lb 12.8 oz (77.021 kg)  02/23/14 140 lb (63.504 kg)  07/13/13 140 lb (63.504 kg)    Usual Body Weight: 163  % Usual Body Weight: 98%  BMI:  Body mass index is 26.74 kg/(m^2).  Estimated Nutritional Needs: Kcal:  1600-1800 Protein: 80-95 g protein Fluid: >/= 1.6 L/day  Skin: lesion on tongue and perineum.  Diet Order: Diet NPO time specified Except for: Sips with Meds  EDUCATION NEEDS: -No education needs identified at this time   Intake/Output Summary (Last 24 hours) at 06/11/14 1217 Last data filed at 06/11/14 0800  Gross per 24 hour  Intake   1686 ml  Output    730 ml  Net    956 ml    Last BM: PTA   Labs:   Recent Labs Lab 06/10/14 0815 06/11/14 0300  NA 139 142  K 3.2* 3.1*  CL 100 111  CO2 26 21  BUN 8 <5*  CREATININE 1.44* 1.09  CALCIUM 9.6 8.4  GLUCOSE 118* 90    CBG (last 3)   Recent Labs  06/10/14 0757  GLUCAP 112*    Scheduled Meds: . acyclovir  10 mg/kg (Order-Specific) Intravenous Q12H  . desmopressin  10 mcg Nasal BID  . gabapentin  300 mg Oral TID  . heparin  5,000 Units Subcutaneous 3 times per day  . hydrocortisone sod succinate (SOLU-CORTEF) inj  50 mg Intravenous Q8H  . levETIRAcetam  1,500 mg Intravenous Q12H  . levothyroxine  50 mcg Intravenous Daily  . methocarbamol  500 mg Oral BID  . mirtazapine  15 mg Oral QHS  . potassium chloride  10 mEq Oral BID  . potassium chloride  10 mEq Intravenous Q1 Hr x 5  Continuous Infusions: . sodium chloride 100 mL/hr at 06/11/14 9574    Past Medical History  Diagnosis Date  . Addison's disease   . Diabetes insipidus   . Pneumonia   . Seizures   . Brain tumor     Past Surgical History  Procedure Laterality Date  . Tonsillectomy    . Abdominal hysterectomy    . Brain surgery      crainotomy    Elmer Picker MS Dietetic Intern Pager Number [phone]

## 2014-06-11 NOTE — Progress Notes (Signed)
PROGRESS NOTE  Shelly Le YTK:[id number] DOB: Jan 04, 1961 DOA: 06/10/2014 PCP: Daneil Dan, MD  Assessment/Plan: SIRS (systemic inflammatory response syndrome): Presenting with fever, headache, nausea with vomiting and some tachycardia. No foci of infection evident as of now, UA negative for UTI, chest x-ray negative for pneumonia. LP done and CSF without any evidence of meningitis/encephalitis.? viral syndrome  -continue with empiric acyclovir for now, await culture data and further CSF studies-HSV PCR.  -Supportive care with IV fluids.   ? Recurrent/breakthrough seizures: Not sure these tremors/"shaky" are consistent with seizures-I doubt.  -continue with Keppra. -MRI brain ok -EEG pending as recommended by neurology. Will follow closely monitor in step down unit.  ARF (acute renal failure): Likely prerenal azotemia given history of recurrent nausea and vomiting. Hydrate and recheck electrolytes in a.m.  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 Follow.   DI (diabetes insipidus): Continue with DDAVP, when able to safely tolerate oral intake, will need to discontinue IV fluids.  Adjustment disorder with mixed emotional features: Supportive care for now, once medical issues are stable and if no documented evidence of infection/sepsis/culture negative-and if MRI brain/EEG negative -may need psychiatric evaluation- had recent hospital stay with  Psuedo seizures/patient eating but reporting that she was unable to eat   Code Status: full Family Communication: patient Disposition Plan:    Consultants:  neuro  Procedures:      HPI/Subjective: No SOB, no CP Pain at edge of mouth where herpetic lesion is  Objective: Filed Vitals:   06/11/14 0700  BP:   Pulse:    Temp: 99 F (37.2 C)  Resp:     Intake/Output Summary (Last 24 hours) at 06/11/14 0852 Last data filed at 06/11/14 0600  Gross per 24 hour  Intake   1486 ml  Output    730 ml  Net    756 ml   Filed Weights   06/10/14 0817 06/10/14 1715  Weight: 73.936 kg (163 lb) 72.9 kg (160 lb 11.5 oz)    Exam:   General:  Awake, NAD- drinking water  Cardiovascular: rrr  Respiratory: clear  Abdomen: +BS, soft, NT  Musculoskeletal: no edema   Data Reviewed: Basic Metabolic Panel:  Recent Labs Lab 06/10/14 0815 06/11/14 0300  NA 139 142  K 3.2* 3.1*  CL 100 111  CO2 26 21  GLUCOSE 118* 90  BUN 8 <5*  CREATININE 1.44* 1.09  CALCIUM 9.6 8.4   Liver Function Tests: No results for input(s): AST, ALT, ALKPHOS, BILITOT, PROT, ALBUMIN in the last 168 hours. No results for input(s): LIPASE, AMYLASE in the last 168 hours. No results for input(s): AMMONIA in the last 168 hours. CBC:  Recent Labs Lab 06/10/14 0815 06/11/14 0300  WBC 9.2 7.5  NEUTROABS 7.0  --   HGB 13.0 12.0  HCT 37.8 34.5*  MCV 78.1 78.2  PLT 310 208   Cardiac Enzymes: No results for input(s): CKTOTAL, CKMB, CKMBINDEX, TROPONINI in the last 168 hours. BNP (last 3 results) No results for input(s): BNP in the last 8760 hours.  ProBNP (last 3 results)  Recent Labs  02/21/14 1157  PROBNP 566.6*    CBG:  Recent Labs Lab 06/10/14 0757  GLUCAP 112*    Recent Results (from the past 240 hour(s))  CSF culture     Status: None (Preliminary result)   Collection Time:  06/10/14 11:03 AM  Result Value Ref Range Status   Specimen Description CSF  Final   Special Requests Normal  Final   Gram Stain   Final    WBC PRESENT, PREDOMINANTLY MONONUCLEAR NO ORGANISMS SEEN CYTOSPIN Performed at [name] Performed at [place] Performed at [facility]   Final   Report Status PENDING  Incomplete  Gram stain     Status: None   Collection Time:  06/10/14 11:03 AM  Result Value Ref Range Status   Specimen Description CSF  Final   Special Requests NONE  Final   Gram Stain   Final    CYTOSPIN SLIDE WBC PRESENT, PREDOMINANTLY MONONUCLEAR NO ORGANISMS SEEN    Report Status 06/10/2014 FINAL  Final  MRSA PCR Screening     Status: None   Collection Time: 06/10/14  6:05 PM  Result Value Ref Range Status   MRSA by PCR NEGATIVE NEGATIVE Final    Comment:        The GeneXpert MRSA Assay (FDA approved for NASAL specimens only), is one component of a comprehensive MRSA colonization surveillance program. It is not intended to diagnose MRSA infection nor to guide or monitor treatment for MRSA infections.      Studies: Ct Head Wo Contrast  06/10/2014   CLINICAL DATA:  Numerous seizures since 06/09/2014. History of right frontal craniotomy.  EXAM: CT HEAD WITHOUT CONTRAST  TECHNIQUE: Contiguous axial images were obtained from the base of the skull through the vertex without intravenous contrast.  COMPARISON:  02/26/2014; 02/23/2014; brain MRI - 02/26/2014  FINDINGS: Stable sequela of right frontal craniotomy.  The gray-white differentiation is maintained. No CT evidence of acute large territory infarct. No intraparenchymal or extra-axial mass or hemorrhage. Normal size and configuration of the ventricles and basilar cisterns. No midline shift. Limited visualization of the paranasal sinuses and mastoid air cells is normal. No air-fluid levels. Regional soft tissues appear normal. No displaced calvarial fracture.  IMPRESSION: 1. No acute intracranial process. 2. Stable sequela of prior right frontal craniotomy.   Electronically Signed   By: Sandi Mariscal M.D.   On: 06/10/2014 09:03   Mr Brain Wo Contrast  06/11/2014   CLINICAL DATA:  Initial valuation for seizure, with fever. Also with receded recent herpetic outbreak. Concern for possible intracranial infection.  EXAM: MRI HEAD WITHOUT CONTRAST  TECHNIQUE: Multiplanar, multiecho pulse sequences of  the brain and surrounding structures were obtained without intravenous contrast.  COMPARISON:  Prior CT from 06/10/2014 as well as previous MRI from 02/26/2014.  FINDINGS: Cerebral volume within normal limits for patient age. No significant white matter changes identified.  No focal parenchymal signal abnormality. No evidence for infarct or hemorrhage. Gray-white matter differentiation maintained. Normal intravascular flow voids present.  Hippocampi are normal in morphology with normal signal intensity bilaterally. No signal abnormality to suggest herpes encephalitis or other active infection.  Cerebellar tonsils are minimally low lying, by within normal limits. Pituitary grossly unremarkable on this non pituitary protocol MRI. No acute abnormality seen about the orbits. Mild exophthalmos noted.  Paranasal sinuses are clear.  No mastoid effusion.  Postoperative changes from prior right frontal craniotomy. Calvarium otherwise unremarkable. Scalp soft tissues within normal limits.  IMPRESSION: 1. No acute intracranial process identified. No signal changes to suggest active intracranial infection identified. 2. Prior right frontal craniotomy.   Electronically Signed   By: Jeannine Boga M.D.   On: 06/11/2014 06:24   Dg Chest [name]  1 View  06/10/2014   CLINICAL DATA:  Came into ER on 06/09/14 and family c/o seizures. Pt. Cannot keep anything down for past two days and seizures worsened today. Pt. Also has flu like symptoms.  EXAM: PORTABLE CHEST - 1 VIEW  COMPARISON:  03/05/2014  FINDINGS: Lungs are adequately inflated without consolidation or effusion. The cardiomediastinal silhouette is within normal. Remainder of the exam is unchanged.  IMPRESSION: No active disease.   Electronically Signed   By: Marin Olp M.D.   On: 06/10/2014 08:56   Dg Abd Portable 2v  06/10/2014   CLINICAL DATA:  Persistent vomiting and abdominal pain. Lower abdominal pain.  EXAM: PORTABLE ABDOMEN - 2 VIEW  COMPARISON:  MRI abdomen  03/11/2014  FINDINGS: The bowel gas pattern is normal. There is no evidence of free air. Surgical clips. No radio-opaque calculi or other significant radiographic abnormality is seen.  IMPRESSION: Negative.   Electronically Signed   By: Lucienne Capers M.D.   On: 06/10/2014 21:14    Scheduled Meds: . acyclovir  10 mg/kg (Order-Specific) Intravenous Q12H  . desmopressin  10 mcg Nasal BID  . gabapentin  300 mg Oral TID  . heparin  5,000 Units Subcutaneous 3 times per day  . hydrocortisone sod succinate (SOLU-CORTEF) inj  50 mg Intravenous Q8H  . levETIRAcetam  1,500 mg Intravenous Q12H  . levothyroxine  50 mcg Intravenous Daily  . methocarbamol  500 mg Oral BID  . mirtazapine  15 mg Oral QHS  . potassium chloride  10 mEq Oral BID  . potassium chloride  10 mEq Intravenous Q1 Hr x 5   Continuous Infusions: . sodium chloride 100 mL/hr at 06/11/14 8341   Antibiotics Given (last 72 hours)    None      Active Problems:   DI (diabetes insipidus)   Adenoma of pituitary s/p resection    Seizure   Anxiety   Panhypopituitarism   Adjustment disorder with mixed emotional features   Fever   SIRS (systemic inflammatory response syndrome)   ARF (acute renal failure)   Vomiting    Time spent: 35 min    Geena Weinhold, [place] [facility] Pager [phone]. If 7PM-7AM, please contact night-coverage at [email or web address], password [facility] 06/11/2014, 8:52 AM  LOS: 1 day

## 2014-06-11 NOTE — Progress Notes (Signed)
PT Cancellation Note  Patient Details Name: Shelly Le MRN: [id number] DOB: May 02, 1961   Cancelled Treatment:    Reason Eval/Treat Not Completed: Medical issues which prohibited therapy, pt just returned from EEG and is having intolerable pain in the head and arm. Daughter reports that this has been an ongoing issue. Spoke with nursing about pt's pain and she was present. Will check back as time allows today.    [place], [place] 06/11/2014, 1:56 PM

## 2014-06-11 NOTE — Progress Notes (Signed)
Utilization review completed. Jarmel Linhardt, RN, BSN.q 

## 2014-06-12 DIAGNOSIS — F419 Anxiety disorder, unspecified: Secondary | ICD-10-CM

## 2014-06-12 DIAGNOSIS — F4323 Adjustment disorder with mixed anxiety and depressed mood: Secondary | ICD-10-CM

## 2014-06-12 LAB — URINE CULTURE
COLONY COUNT: NO GROWTH
Culture: NO GROWTH

## 2014-06-12 LAB — BASIC METABOLIC PANEL
Anion gap: 11 (ref 5–15)
BUN: 9 mg/dL (ref 6–23)
CO2: 18 mmol/L — ABNORMAL LOW (ref 19–32)
CREATININE: 1.08 mg/dL (ref 0.50–1.10)
Calcium: 8.6 mg/dL (ref 8.4–10.5)
Chloride: 108 mmol/L (ref 96–112)
GFR, EST AFRICAN AMERICAN: 66 mL/min — AB (ref >90)
GFR, EST NON AFRICAN AMERICAN: 57 mL/min — AB (ref >90)
GLUCOSE: 85 mg/dL (ref 70–99)
Potassium: 3.8 mmol/L (ref 3.5–5.1)
Sodium: 137 mmol/L (ref 135–145)

## 2014-06-12 LAB — CBC
HCT: 34 % — ABNORMAL LOW (ref 36.0–46.0)
HEMOGLOBIN: 11.3 g/dL — AB (ref 12.0–15.0)
MCH: 26.4 pg (ref 26.0–34.0)
MCHC: 33.2 g/dL (ref 30.0–36.0)
MCV: 79.4 fL (ref 78.0–100.0)
PLATELETS: 195 10*3/uL (ref 150–400)
RBC: 4.28 MIL/uL (ref 3.87–5.11)
RDW: 13.1 % (ref 11.5–15.5)
WBC: 8.8 10*3/uL (ref 4.0–10.5)

## 2014-06-12 LAB — VDRL, CSF: VDRL Quant, CSF: NONREACTIVE

## 2014-06-12 MED ORDER — LORAZEPAM 2 MG/ML IJ SOLN
1.0000 mg | Freq: Four times a day (QID) | INTRAMUSCULAR | Status: DC | PRN
  Administered 2014-06-12 – 2014-06-13 (×2): 2 mg via INTRAVENOUS
  Filled 2014-06-12 (×2): qty 1

## 2014-06-12 MED ORDER — OXYCODONE HCL 5 MG PO TABS: 15.0000 mg | ORAL_TABLET | Freq: Three times a day (TID) | ORAL | Status: DC | PRN

## 2014-06-12 MED ORDER — OXYCODONE HCL 5 MG PO TABS
15.0000 mg | ORAL_TABLET | Freq: Three times a day (TID) | ORAL | Status: DC | PRN
  Administered 2014-06-12 – 2014-06-13 (×2): 15 mg via ORAL
  Filled 2014-06-12: qty 3

## 2014-06-12 MED ORDER — PROMETHAZINE HCL 25 MG/ML IJ SOLN
12.5000 mg | Freq: Four times a day (QID) | INTRAMUSCULAR | Status: DC | PRN
  Administered 2014-06-12 – 2014-06-18 (×18): 12.5 mg via INTRAVENOUS
  Filled 2014-06-12 (×18): qty 1

## 2014-06-12 MED ORDER — TIZANIDINE HCL 4 MG PO TABS
4.0000 mg | ORAL_TABLET | Freq: Three times a day (TID) | ORAL | Status: DC | PRN
  Administered 2014-06-12 (×2): 4 mg via ORAL
  Filled 2014-06-12 (×5): qty 1

## 2014-06-12 MED ORDER — HYDROCORTISONE NA SUCCINATE PF 100 MG IJ SOLR
50.0000 mg | Freq: Three times a day (TID) | INTRAMUSCULAR | Status: DC
  Administered 2014-06-12 – 2014-06-13 (×3): 50 mg via INTRAVENOUS
  Filled 2014-06-12 (×6): qty 1

## 2014-06-12 MED ORDER — LEVETIRACETAM 750 MG PO TABS
1500.0000 mg | ORAL_TABLET | Freq: Two times a day (BID) | ORAL | Status: DC
  Administered 2014-06-12: 1500 mg via ORAL
  Filled 2014-06-12 (×4): qty 2

## 2014-06-12 MED ORDER — DEXTROSE 5 % IV SOLN
500.0000 mg | Freq: Three times a day (TID) | INTRAVENOUS | Status: DC
  Filled 2014-06-12 (×2): qty 10

## 2014-06-12 MED ORDER — LEVOTHYROXINE SODIUM 100 MCG PO TABS
100.0000 ug | ORAL_TABLET | Freq: Every day | ORAL | Status: DC
  Administered 2014-06-13 – 2014-06-21 (×8): 100 ug via ORAL
  Filled 2014-06-12 (×10): qty 1

## 2014-06-12 MED ORDER — BOOST / RESOURCE BREEZE PO LIQD
1.0000 | Freq: Two times a day (BID) | ORAL | Status: DC
  Administered 2014-06-12: 1 via ORAL

## 2014-06-12 MED ORDER — HYDROCORTISONE NA SUCCINATE PF 100 MG IJ SOLR: 50.0000 mg | Freq: Two times a day (BID) | INTRAMUSCULAR | Status: DC

## 2014-06-12 NOTE — Progress Notes (Signed)
PT Cancellation Note  Patient Details Name: Shelly Le MRN: [id number] DOB: March 06, 1961   Cancelled Treatment:    Reason Eval/Treat Not Completed: Other (comment)pt currently speaking with psych.  Will try another time.     Christionna Poland, [place] Papas 06/12/2014, 2:01 PM

## 2014-06-12 NOTE — Progress Notes (Signed)
Subjective: No overnight events. Patient reports diffuse nonspecific pain, states dilaudid is the only thing that helps. Afebrile overnight. EEG unremarkable.  Objective: Current vital signs: BP 85/44 mmHg  Pulse 57  Temp(Src) 98 F (36.7 C) (Axillary)  Resp 14  Ht 5\' 5"  (1.651 m)  Wt 72.9 kg (160 lb 11.5 oz)  BMI 26.74 kg/m2  SpO2 95% Vital signs in last 24 hours: Temp:  [98 F (36.7 C)-99.2 F (37.3 C)] 98 F (36.7 C) (02/09 0317) Pulse Rate:  [48-66] 57 (02/09 0700) Resp:  [13-23] 14 (02/09 0700) BP: (85-134)/(44-89) 85/44 mmHg (02/09 0700) SpO2:  [95 %-100 %] 95 % (02/09 0700)  Intake/Output from previous day: 02/08 0701 - 02/09 0700 In: 2120 [P.O.:120; I.V.:1800; IV Piggyback:200] Out: 700 [Urine:700] Intake/Output this shift: Total I/O In: 100 [I.V.:100] Out: -  Nutritional status: Diet clear liquid  Neurologic Exam: Mental Status: Alert, oriented, thought content appropriate. Speech fluent without evidence of aphasia.  Cranial Nerves: II:  Visual fields grossly normal, pupils equal, round, reactive to light III,IV, VI: ptosis not present, extra-ocular motions intact bilaterally V,VII: smile symmetric, facial light touch sensation normal bilaterally VIII: hearing normal bilaterally Motor: Moves all extremities spontaneously and symmetrically. Tone and bulk:normal tone throughout; no atrophy noted Sensory: light touch intact throughout, bilaterally  Lab Results: Basic Metabolic Panel:  Recent Labs Lab 06/10/14 0815 06/11/14 0300 06/12/14 0130  NA 139 142 137  K 3.2* 3.1* 3.8  CL 100 111 108  CO2 26 21 18*  GLUCOSE 118* 90 85  BUN 8 <5* 9  CREATININE 1.44* 1.09 1.08  CALCIUM 9.6 8.4 8.6    Liver Function Tests: No results for input(s): AST, ALT, ALKPHOS, BILITOT, PROT, ALBUMIN in the last 168 hours. No results for input(s): LIPASE, AMYLASE in the last 168 hours. No results for input(s): AMMONIA in the last 168 hours.  CBC:  Recent Labs Lab  06/10/14 0815 06/11/14 0300 06/12/14 0130  WBC 9.2 7.5 8.8  NEUTROABS 7.0  --   --   HGB 13.0 12.0 11.3*  HCT 37.8 34.5* 34.0*  MCV 78.1 78.2 79.4  PLT 310 208 195    Cardiac Enzymes: No results for input(s): CKTOTAL, CKMB, CKMBINDEX, TROPONINI in the last 168 hours.  Lipid Panel: No results for input(s): CHOL, TRIG, HDL, CHOLHDL, VLDL, LDLCALC in the last 168 hours.  CBG:  Recent Labs Lab 06/10/14 0757  GLUCAP 112*    Microbiology: Results for orders placed or performed during the hospital encounter of 06/10/14  Urine culture     Status: None   Collection Time: 06/10/14  9:26 AM  Result Value Ref Range Status   Specimen Description URINE, CATHETERIZED  Final   Special Requests NONE  Final   Colony Count NO GROWTH Performed at [facility]   Final   Culture NO GROWTH Performed at [facility]   Final   Report Status 06/12/2014 FINAL  Final  CSF culture     Status: None (Preliminary result)   Collection Time: 06/10/14 11:03 AM  Result Value Ref Range Status   Specimen Description CSF  Final   Special Requests Normal  Final   Gram Stain   Final    WBC PRESENT, PREDOMINANTLY MONONUCLEAR NO ORGANISMS SEEN CYTOSPIN Performed at [facility] Performed at [place] Performed at [facility]   Final   Report Status PENDING  Incomplete  Gram stain     Status: None  Collection Time: 06/10/14 11:03 AM  Result Value Ref Range Status   Specimen Description CSF  Final   Special Requests NONE  Final   Gram Stain   Final    CYTOSPIN SLIDE WBC PRESENT, PREDOMINANTLY MONONUCLEAR NO ORGANISMS SEEN    Report Status 06/10/2014 FINAL  Final  MRSA PCR Screening     Status: None   Collection Time: 06/10/14  6:05 PM  Result Value Ref Range Status   MRSA by PCR NEGATIVE NEGATIVE Final    Comment:        The GeneXpert MRSA Assay (FDA approved for NASAL specimens only), is one component of a comprehensive  MRSA colonization surveillance program. It is not intended to diagnose MRSA infection nor to guide or monitor treatment for MRSA infections.     Coagulation Studies: No results for input(s): LABPROT, INR in the last 72 hours.  Imaging: Ct Head Wo Contrast  06/10/2014   CLINICAL DATA:  Numerous seizures since 06/09/2014. History of right frontal craniotomy.  EXAM: CT HEAD WITHOUT CONTRAST  TECHNIQUE: Contiguous axial images were obtained from the base of the skull through the vertex without intravenous contrast.  COMPARISON:  02/26/2014; 02/23/2014; brain MRI - 02/26/2014  FINDINGS: Stable sequela of right frontal craniotomy.  The gray-white differentiation is maintained. No CT evidence of acute large territory infarct. No intraparenchymal or extra-axial mass or hemorrhage. Normal size and configuration of the ventricles and basilar cisterns. No midline shift. Limited visualization of the paranasal sinuses and mastoid air cells is normal. No air-fluid levels. Regional soft tissues appear normal. No displaced calvarial fracture.  IMPRESSION: 1. No acute intracranial process. 2. Stable sequela of prior right frontal craniotomy.   Electronically Signed   By: Sandi Mariscal M.D.   On: 06/10/2014 09:03   Mr Brain Wo Contrast  06/11/2014   CLINICAL DATA:  Initial valuation for seizure, with fever. Also with receded recent herpetic outbreak. Concern for possible intracranial infection.  EXAM: MRI HEAD WITHOUT CONTRAST  TECHNIQUE: Multiplanar, multiecho pulse sequences of the brain and surrounding structures were obtained without intravenous contrast.  COMPARISON:  Prior CT from 06/10/2014 as well as previous MRI from 02/26/2014.  FINDINGS: Cerebral volume within normal limits for patient age. No significant white matter changes identified.  No focal parenchymal signal abnormality. No evidence for infarct or hemorrhage. Gray-white matter differentiation maintained. Normal intravascular flow voids present.   Hippocampi are normal in morphology with normal signal intensity bilaterally. No signal abnormality to suggest herpes encephalitis or other active infection.  Cerebellar tonsils are minimally low lying, by within normal limits. Pituitary grossly unremarkable on this non pituitary protocol MRI. No acute abnormality seen about the orbits. Mild exophthalmos noted.  Paranasal sinuses are clear.  No mastoid effusion.  Postoperative changes from prior right frontal craniotomy. Calvarium otherwise unremarkable. Scalp soft tissues within normal limits.  IMPRESSION: 1. No acute intracranial process identified. No signal changes to suggest active intracranial infection identified. 2. Prior right frontal craniotomy.   Electronically Signed   By: Jeannine Boga M.D.   On: 06/11/2014 06:24   Dg Chest Port 1 View  06/10/2014   CLINICAL DATA:  Came into ER on 06/09/14 and family c/o seizures. Pt. Cannot keep anything down for past two days and seizures worsened today. Pt. Also has flu like symptoms.  EXAM: PORTABLE CHEST - 1 VIEW  COMPARISON:  03/05/2014  FINDINGS: Lungs are adequately inflated without consolidation or effusion. The cardiomediastinal silhouette is within normal. Remainder of the exam is unchanged.  IMPRESSION: No active disease.   Electronically Signed   By: Marin Olp M.D.   On: 06/10/2014 08:56   Dg Abd Portable 2v  06/10/2014   CLINICAL DATA:  Persistent vomiting and abdominal pain. Lower abdominal pain.  EXAM: PORTABLE ABDOMEN - 2 VIEW  COMPARISON:  MRI abdomen 03/11/2014  FINDINGS: The bowel gas pattern is normal. There is no evidence of free air. Surgical clips. No radio-opaque calculi or other significant radiographic abnormality is seen.  IMPRESSION: Negative.   Electronically Signed   By: Lucienne Capers M.D.   On: 06/10/2014 21:14    Medications:  Scheduled: . acyclovir  10 mg/kg (Order-Specific) Intravenous Q12H  . desmopressin  10 mcg Nasal BID  . gabapentin  300 mg Oral TID  .  heparin  5,000 Units Subcutaneous 3 times per day  . hydrocortisone sod succinate (SOLU-CORTEF) inj  50 mg Intravenous 3 times per day  . levETIRAcetam  1,500 mg Intravenous Q12H  . levothyroxine  50 mcg Intravenous Daily  . methocarbamol  500 mg Oral BID  . mirtazapine  15 mg Oral QHS  . potassium chloride  10 mEq Oral BID    Assessment/Plan: 54 y/o with a history of seizures that apparently developed after pituitary adenoma resection > 10 years ago, comes in with seizures since yesterday, HA, fever 101 past Friday, and noted to have flare up of herpetic lesions. Mental status back to baseline. Breakthrough seizures likely related to underlying infectious etiology of unclear origin. CSF unremarkable so unlikely to be HSV encephalitis but will continue acyclovir until HSV PCR returns.   -continue acylovir, if HSV PCR negative then can d/c -contiue keppra 1500mg  BID -no further neurological workup indicated at this time -pain management per primary team, may benefit from psychiatry evaluation    LOS: 2 days   Jim Like, DO [facility] [phone]  If 7pm- 7am, please page neurology on call as listed in [place]. 06/12/2014  8:02 AM

## 2014-06-12 NOTE — Progress Notes (Addendum)
Patient is requesting a new physician.  Patient states she want "a doctor who will give her pain medicine."  Reviewed patients medications with her and patient states "only thing that helps is Dilaudid and Phenegran at the same time."  Explained the changes in narcotics and patient began to display seizure like activity.  Activity lasted one minute patient answered questions intermittently during seizure like activity.  Immediately following patient was aware of seizure activity with no deficits.  Pt. VSS remained stable throughout with no significant changes.  Physician updated no new orders received at this time.  Will continue to monitor.

## 2014-06-12 NOTE — Progress Notes (Addendum)
PROGRESS NOTE  Shelly Le PFX:[id number] DOB: 12-06-1960 DOA: 06/10/2014 PCP: Daneil Dan, MD  Assessment/Plan: SIRS (systemic inflammatory response syndrome): Presenting with fever, headache, nausea with vomiting and some tachycardia. No foci of infection evident as of now, UA negative for UTI, chest x-ray negative for pneumonia. LP done and CSF without any evidence of meningitis/encephalitis.? viral syndrome  -continue with empiric acyclovir for now, await culture data and further CSF studies-HSV culture pending  -Supportive care with IV fluids.   ? Recurrent/breakthrough seizures: Not sure these tremors/"shaky" are consistent with seizures-I doubt.  -continue with Keppra. -MRI brain ok -EEG ok. Will follow closely monitor in step down unit.  ARF (acute renal failure): Likely prerenal azotemia given history of recurrent nausea and vomiting.  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 Follow.   DI (diabetes insipidus): Continue with DDAVP, when able to safely tolerate oral intake, will need to discontinue IV fluids.  Adjustment disorder with mixed emotional features: Supportive care for now, once medical issues are stable and if no documented evidence of infection/sepsis/culture negative-and if MRI brain/EEG negative  -psychiatric evaluation- had recent hospital stay with  Psuedo seizures/patient eating but reporting that she was unable to eat - documented in last d/c summary  Polypharmacy -patient not taking essential meds such as keppra but is using fentanyl patch andxanaflex  Code Status: full Family Communication: patient/ called daughter and son, no answer Disposition Plan: leave in  SDU   Consultants:  neuro  Procedures:      HPI/Subjective: Demending dilaudid- only thing that help her "all over" pain   Objective: Filed Vitals:   06/12/14 0700  BP: 85/44  Pulse: 57  Temp:   Resp: 14    Intake/Output Summary (Last 24 hours) at 06/12/14 0816 Last data filed at 06/12/14 0745  Gross per 24 hour  Intake   2120 ml  Output    700 ml  Net   1420 ml   Filed Weights   06/10/14 0817 06/10/14 1715  Weight: 73.936 kg (163 lb) 72.9 kg (160 lb 11.5 oz)    Exam:   General:  Awake, NAD  Cardiovascular: rrr  Respiratory: clear  Abdomen: +BS, soft, NT  Musculoskeletal: no edema   Data Reviewed: Basic Metabolic Panel:  Recent Labs Lab 06/10/14 0815 06/11/14 0300 06/12/14 0130  NA 139 142 137  K 3.2* 3.1* 3.8  CL 100 111 108  CO2 26 21 18*  GLUCOSE 118* 90 85  BUN 8 <5* 9  CREATININE 1.44* 1.09 1.08  CALCIUM 9.6 8.4 8.6   Liver Function Tests: No results for input(s): AST, ALT, ALKPHOS, BILITOT, PROT, ALBUMIN in the last 168 hours. No results for input(s): LIPASE, AMYLASE in the last 168 hours. No results for input(s): AMMONIA in the last 168 hours. CBC:  Recent Labs Lab 06/10/14 0815 06/11/14 0300 06/12/14 0130  WBC 9.2 7.5 8.8  NEUTROABS 7.0  --   --   HGB 13.0 12.0 11.3*  HCT 37.8 34.5* 34.0*  MCV 78.1 78.2 79.4  PLT 310 208 195   Cardiac Enzymes: No results for input(s): CKTOTAL, CKMB, CKMBINDEX, TROPONINI in the last 168 hours. BNP (last 3 results) No results for input(s): BNP in the last 8760 hours.  ProBNP (last 3 results)  Recent Labs  02/21/14 1157  PROBNP  566.6*    CBG:  Recent Labs Lab 06/10/14 0757  GLUCAP 112*    Recent Results (from the past 240 hour(s))  Urine culture     Status: None   Collection Time: 06/10/14  9:26 AM  Result Value Ref Range Status   Specimen Description URINE, CATHETERIZED  Final   Special Requests NONE  Final   Colony Count NO GROWTH Performed at [facility]   Final   Culture NO GROWTH Performed at [facility]   Final   Report Status 06/12/2014 FINAL  Final  CSF culture     Status: None (Preliminary result)   Collection Time: 06/10/14 11:03 AM  Result Value Ref Range Status   Specimen Description CSF  Final   Special Requests Normal  Final   Gram Stain   Final    WBC PRESENT, PREDOMINANTLY MONONUCLEAR NO ORGANISMS SEEN CYTOSPIN Performed at [facility] Performed at [place] Performed at [facility]   Final   Report Status PENDING  Incomplete  Gram stain     Status: None   Collection Time: 06/10/14 11:03 AM  Result Value Ref Range Status   Specimen Description CSF  Final   Special Requests NONE  Final   Gram Stain   Final    CYTOSPIN SLIDE WBC PRESENT, PREDOMINANTLY MONONUCLEAR NO ORGANISMS SEEN    Report Status 06/10/2014 FINAL  Final  MRSA PCR Screening     Status: None   Collection Time: 06/10/14  6:05 PM  Result Value Ref Range Status   MRSA by PCR NEGATIVE NEGATIVE Final    Comment:        The GeneXpert MRSA Assay (FDA approved for NASAL specimens only), is one component of a comprehensive MRSA colonization surveillance program. It is not intended to diagnose MRSA infection nor to guide or monitor treatment for MRSA infections.      Studies: Ct Head Wo Contrast  06/10/2014   CLINICAL DATA:  Numerous seizures since 06/09/2014. History of right frontal craniotomy.  EXAM: CT HEAD WITHOUT CONTRAST  TECHNIQUE: Contiguous axial images were obtained from the base of the skull through the vertex without intravenous contrast.  COMPARISON:  02/26/2014; 02/23/2014; brain MRI - 02/26/2014  FINDINGS: Stable sequela of right frontal craniotomy.  The gray-white differentiation is maintained. No CT evidence of acute large territory infarct. No intraparenchymal or extra-axial mass or hemorrhage. Normal size and configuration of the ventricles and basilar cisterns. No  midline shift. Limited visualization of the paranasal sinuses and mastoid air cells is normal. No air-fluid levels. Regional soft tissues appear normal. No displaced calvarial fracture.  IMPRESSION: 1. No acute intracranial process. 2. Stable sequela of prior right frontal craniotomy.   Electronically Signed   By: Sandi Mariscal M.D.   On: 06/10/2014 09:03   Mr Brain Wo Contrast  06/11/2014   CLINICAL DATA:  Initial valuation for seizure, with fever. Also with receded recent herpetic outbreak. Concern for possible intracranial infection.  EXAM: MRI HEAD WITHOUT CONTRAST  TECHNIQUE: Multiplanar, multiecho pulse sequences of the brain and surrounding structures were obtained without intravenous contrast.  COMPARISON:  Prior CT from 06/10/2014 as well as previous MRI from 02/26/2014.  FINDINGS: Cerebral volume within normal limits for patient age. No significant white matter changes identified.  No focal parenchymal signal abnormality. No evidence for infarct or hemorrhage. Gray-white matter differentiation maintained. Normal intravascular flow voids present.  Hippocampi are normal in morphology with normal signal  intensity bilaterally. No signal abnormality to suggest herpes encephalitis or other active infection.  Cerebellar tonsils are minimally low lying, by within normal limits. Pituitary grossly unremarkable on this non pituitary protocol MRI. No acute abnormality seen about the orbits. Mild exophthalmos noted.  Paranasal sinuses are clear.  No mastoid effusion.  Postoperative changes from prior right frontal craniotomy. Calvarium otherwise unremarkable. Scalp soft tissues within normal limits.  IMPRESSION: 1. No acute intracranial process identified. No signal changes to suggest active intracranial infection identified. 2. Prior right frontal craniotomy.   Electronically Signed   By: Jeannine Boga M.D.   On: 06/11/2014 06:24   Dg Chest Port 1 View  06/10/2014   CLINICAL DATA:  Came into ER on 06/09/14 and  family c/o seizures. Pt. Cannot keep anything down for past two days and seizures worsened today. Pt. Also has flu like symptoms.  EXAM: PORTABLE CHEST - 1 VIEW  COMPARISON:  03/05/2014  FINDINGS: Lungs are adequately inflated without consolidation or effusion. The cardiomediastinal silhouette is within normal. Remainder of the exam is unchanged.  IMPRESSION: No active disease.   Electronically Signed   By: Marin Olp M.D.   On: 06/10/2014 08:56   Dg Abd Portable 2v  06/10/2014   CLINICAL DATA:  Persistent vomiting and abdominal pain. Lower abdominal pain.  EXAM: PORTABLE ABDOMEN - 2 VIEW  COMPARISON:  MRI abdomen 03/11/2014  FINDINGS: The bowel gas pattern is normal. There is no evidence of free air. Surgical clips. No radio-opaque calculi or other significant radiographic abnormality is seen.  IMPRESSION: Negative.   Electronically Signed   By: Lucienne Capers M.D.   On: 06/10/2014 21:14    Scheduled Meds: . acyclovir  10 mg/kg (Order-Specific) Intravenous Q12H  . desmopressin  10 mcg Nasal BID  . gabapentin  300 mg Oral TID  . heparin  5,000 Units Subcutaneous 3 times per day  . hydrocortisone sod succinate (SOLU-CORTEF) inj  50 mg Intravenous 3 times per day  . levETIRAcetam  1,500 mg Intravenous Q12H  . levothyroxine  50 mcg Intravenous Daily  . methocarbamol  500 mg Oral BID  . mirtazapine  15 mg Oral QHS  . potassium chloride  10 mEq Oral BID   Continuous Infusions: . sodium chloride 100 mL/hr at 06/11/14 1548   Antibiotics Given (last 72 hours)    None      Active Problems:   DI (diabetes insipidus)   Adenoma of pituitary s/p resection    Seizure   Anxiety   Panhypopituitarism   Adjustment disorder with mixed emotional features   Fever   SIRS (systemic inflammatory response syndrome)   ARF (acute renal failure)   Vomiting    Time spent: 35 min    Ziva Nunziata  [facility] Pager [phone]. If 7PM-7AM, please contact night-coverage at [email or web address],  password [facility] 06/12/2014, 8:16 AM  LOS: 2 days

## 2014-06-12 NOTE — Progress Notes (Addendum)
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 Contrast Media [Iodinated Diagnostic Agents] Shortness Of Breath and Rash  . Fentanyl Other (See Comments)    Witnessed grand tonic clonic seizure in ED while wearing patch (02/26/14); h/o seizure disorder (pt states she still uses patches as needed for pain)  . Morphine Hives and Shortness Of Breath  . Shellfish Allergy Anaphylaxis and Hives  . Penicillins Nausea And Vomiting    GI Intolerance  . Latex Rash  . Metrizamide Rash    Patient Measurements: Height: 5\' 5"  (165.1 cm) Weight: 160 lb 11.5 oz (72.9 kg) IBW/kg (Calculated) : 57 Adjusted Body Weight: 62kg  Vital Signs: Temp: 98 F (36.7 C) (02/09 0317) Temp Source: Axillary (02/09 0317) BP: 85/44 mmHg (02/09 0700) Pulse Rate: 57 (02/09 0700) Intake/Output from previous day: 02/08 0701 - 02/09 0700 In: 2120 [P.O.:120; I.V.:1800; IV Piggyback:200] Out: 700 [Urine:700] Intake/Output from this shift: Total I/O In: 100 [I.V.:100] Out: -   Labs:  Recent Labs  06/10/14 0815 06/11/14 0300 06/12/14 0130  WBC 9.2 7.5 8.8  HGB 13.0 12.0 11.3*  PLT 310 208 195  CREATININE 1.44* 1.09 1.08   Estimated Creatinine Clearance: 59.6 mL/min (by C-G formula based on Cr of 1.08). No results for input(s): VANCOTROUGH, VANCOPEAK, VANCORANDOM, GENTTROUGH, GENTPEAK, GENTRANDOM, TOBRATROUGH, TOBRAPEAK, TOBRARND, AMIKACINPEAK, AMIKACINTROU, AMIKACIN in the last 72 hours.   Microbiology: Recent Results (from the past 720 hour(s))  Urine culture     Status: None   Collection Time: 06/10/14  9:26 AM  Result Value Ref Range Status   Specimen Description URINE, CATHETERIZED  Final   Special Requests NONE  Final   Colony Count NO GROWTH Performed at [facility]   Final   Culture NO GROWTH Performed at [facility]   Final   Report Status 06/12/2014 FINAL  Final  CSF culture      Status: None (Preliminary result)   Collection Time: 06/10/14 11:03 AM  Result Value Ref Range Status   Specimen Description CSF  Final   Special Requests Normal  Final   Gram Stain   Final    WBC PRESENT, PREDOMINANTLY MONONUCLEAR NO ORGANISMS SEEN CYTOSPIN Performed at [facility] Performed at [place] Performed at [facility]   Final   Report Status PENDING  Incomplete  Gram stain     Status: None   Collection Time: 06/10/14 11:03 AM  Result Value Ref Range Status   Specimen Description CSF  Final   Special Requests NONE  Final   Gram Stain   Final    CYTOSPIN SLIDE WBC PRESENT, PREDOMINANTLY MONONUCLEAR NO ORGANISMS SEEN    Report Status 06/10/2014 FINAL  Final  MRSA PCR Screening     Status: None   Collection Time: 06/10/14  6:05 PM  Result Value Ref Range Status   MRSA by PCR NEGATIVE NEGATIVE Final    Comment:        The GeneXpert MRSA Assay (FDA approved for NASAL specimens only), is one component of a comprehensive MRSA colonization surveillance program. It is not intended to diagnose MRSA infection nor to guide or monitor treatment for MRSA infections.     Medical History: Past Medical History  Diagnosis Date  . Addison's disease   . Diabetes insipidus   . Pneumonia   . Seizures   . Brain tumor    Assessment: 53  YOF with history of seizures presented with seizures ongoing since yesterday. During exam, she mentions a lesion on left inner buttock, also has a sore in her mouth. She had an LP done at the bedside in ED.   Per neuro, CSF unremarkable so unlikely to be HSV encephalitis but will continue acyclovir until HSV PCR returns.   WBC wnl, tmax 99.2. Marland Kitchen SCr 1.44>>1.08  with est CrCl 60 mL/min.  Goal of Therapy:  proper dosing based on hepatic and renal function  Plan:  - increase acyclovir to 500 mg IV q8h for improved renal function (10mg /kg IBW)   -DC acyclovir once HSV PCR is  negative -could change IV meds to po soon  Eudelia Bunch, Pharm.D. [phone] 06/12/2014 9:22 AM  Addendum: HSV negative  Plan: dc acyclovir,  Change IV keppra and IV synthroid to PO  Eudelia Bunch, Pharm.D. [phone] 06/12/2014 4:08 PM
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
## 2014-06-12 NOTE — Progress Notes (Signed)
PT Cancellation Note  Patient Details Name: Shelly Le MRN: [id number] DOB: 07-Aug-1960   Cancelled Treatment:    Reason Eval/Treat Not Completed: Other (comment)pt upset wanting IV pain meds and speaking with Nsg leadership at this time.  Will f/u another time.     Eleanor Dimichele, Thornton Papas 06/12/2014, 11:56 AM

## 2014-06-13 LAB — HERPES SIMPLEX VIRUS CULTURE: CULTURE: DETECTED

## 2014-06-13 MED ORDER — LORAZEPAM 2 MG/ML IJ SOLN
1.0000 mg | Freq: Three times a day (TID) | INTRAMUSCULAR | Status: DC
  Administered 2014-06-13 – 2014-06-22 (×26): 1 mg via INTRAVENOUS
  Filled 2014-06-13 (×24): qty 1

## 2014-06-13 MED ORDER — OXYCODONE HCL 5 MG PO TABS
ORAL_TABLET | ORAL | Status: AC
  Filled 2014-06-13: qty 3

## 2014-06-13 MED ORDER — METHOCARBAMOL 500 MG PO TABS
500.0000 mg | ORAL_TABLET | Freq: Three times a day (TID) | ORAL | Status: DC | PRN
  Administered 2014-06-13: 500 mg via ORAL
  Filled 2014-06-13 (×4): qty 1

## 2014-06-13 MED ORDER — HYDROCORTISONE NA SUCCINATE PF 100 MG IJ SOLR
25.0000 mg | Freq: Two times a day (BID) | INTRAMUSCULAR | Status: DC
  Administered 2014-06-13 – 2014-06-20 (×14): 25 mg via INTRAVENOUS
  Filled 2014-06-13 (×3): qty 0.5
  Filled 2014-06-13: qty 2
  Filled 2014-06-13: qty 0.5
  Filled 2014-06-13 (×2): qty 2
  Filled 2014-06-13: qty 0.5
  Filled 2014-06-13 (×2): qty 2
  Filled 2014-06-13 (×2): qty 0.5
  Filled 2014-06-13 (×2): qty 2
  Filled 2014-06-13: qty 0.5
  Filled 2014-06-13: qty 2
  Filled 2014-06-13 (×2): qty 0.5

## 2014-06-13 MED ORDER — FENTANYL 25 MCG/HR TD PT72
50.0000 ug | MEDICATED_PATCH | TRANSDERMAL | Status: DC
  Administered 2014-06-13 – 2014-06-22 (×3): 50 ug via TRANSDERMAL
  Filled 2014-06-13 (×4): qty 2

## 2014-06-13 MED ORDER — BOOST / RESOURCE BREEZE PO LIQD: 1.0000 | Freq: Two times a day (BID) | ORAL | Status: DC | PRN

## 2014-06-13 MED ORDER — LORAZEPAM 2 MG/ML IJ SOLN
INTRAMUSCULAR | Status: AC
  Filled 2014-06-13: qty 1

## 2014-06-13 MED ORDER — LEVETIRACETAM IN NACL 1000 MG/100ML IV SOLN
1000.0000 mg | Freq: Two times a day (BID) | INTRAVENOUS | Status: DC
  Administered 2014-06-13 – 2014-06-18 (×12): 1000 mg via INTRAVENOUS
  Filled 2014-06-13 (×15): qty 100

## 2014-06-13 NOTE — Progress Notes (Signed)
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  Apparently since 06/06/14 patient had been having intermittent fevers.  She started having tingling and numbness of her bilateral upper extremities, then persistent nausea with vomiting. She stated she had not been able to keep anything down. Patient was on the phone with her daughter when she began to have sx c/w her usual seizure activity. When the daughter arrived at the pt's place, patient was lying on a couch and was very lethargic.  They got into the car, and drove her from [facility] to [facility].  Upon arrival to the emergency room, patient was noted to be anxious, rolling around and shaking. Although, there were no obvious seizure-like movements, because of a history of fever, headache and possible worsening seizures, patient underwent a lumbar puncture.  HPI/Subjective: The pt is alert and shows no evidence on observation of distress or pain.  After I introduce myself she immediately begins to explain how her current pain med and nausea medication regimen is not working and that she tried to tell the doctor yesterday that she needed dialudid but that the doctor would not listen and was probably in a hurry to go drink wine and lounge around.  She spends the majority of the time I am in the room attempting to convince me that dilaudid and phenergan are the only medications that will work for her, and that she also needs regular doses of xanax.  She at no point appears to be in pain outwardly.  She initially explains that dilaudid is needed to treat her abdom pain, nausea, and vomiting.  When I explain that the use of narcotics in the case of nausea and vomiting  can worsen the issue by contributing to gastroparesis, she then begins to explain that the majority of her pain is actually focused about her L lateral scalp due to "shingles."  She has no rash in that area, or findings to suggest a recent facial shingles outbreak.  We did not argue, but simply had a point/counter point type discussion.  I explained to her that I felt that the use of IV narcotics was likely to worsen her GI sx and that I therefore did not agree with prescrrbing these meds.  I assured her I would do the best I could to assist in controling her pain, without contributing to her underlying medical problems.  She persisted in her report that she was so nauseated that she could not keep any food or even clear liquids down.    Assessment/Plan:  SIRS UA negative for UTI, chest x-ray negative for pneumonia, LP/CSF without evidence of meningitis/encephalitis, influenza panel negative - CSF HSV negative - ?if presenting sx were not due to an addisonian crisis due to noncompliance w/ her replacement hormones   Seizures v/s pseudoseizures MRI brain unrevealing - EEG w/o evidence of seizure - no new recommendations from Neurology   Acute renal failure prerenal azotemia given history of recurrent nausea and vomiting - crt peaked at 1.44  Narcotic seeking behavior Per RN the pt "states she wants 'a doctor who will give her pain medicine'and 'only thing that helps is Dilaudid and Phenegran at the same time' " - when informed of  changes in her narcotic meds by RN the pt "began to display seizure like activity" w/ pt intermittently answering questions th/o the episode - review of chart notes she has requested dilaudid by name to multiple different caregivers, to include consulting MDs, the attending MD, and the nursing staff - I do not feel that the use of IV pain medications is indicated (I see no objective evidence to suggest she is actually in pain), but even if there was evidence of pain, I fear  that use of IV narcotics would only exacerbate her intractable nausea and vomiting - I have informed her that I will not prescribe any medication that could cause her harm - UDS was not done this admit - her home med list includes a fentanyl patch as well as percocet and xanax - at the time of her most recent d/c (Nov 2015) she was sent home on ativan 1mg  Q8 but no narcotics - in order to avoid withdrawal from benzos or narcotics, I will resume a low dose fentanyl patch, and scheduled low dose ativan, but will NOT titrate upward or provide prn - will attempt non-narcotic means to control her reported pain    Hypokalemia  Corrected w/ replacement  Hypotension  Resolved - likely due to addisonian crisis +/- DH  N/V - diffuse chronic abdom pain Pt has been seen for this at the [facility] - as noted above I will avoid tx w/ IV narcotics - advance diet as able, but in that pt currently states even clears are causing her to vomit, I will limit her to ice chips only - no new elements of her sx to suggest exposure to CT abdom would be beneficial   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 not taking her hydrocortisone at home - cont her prescribed tx regimen, and give IV as needed based upon oral tolerance   DI Continue with DDAVP - Na and volume status currently normal   Adjustment D/O As per Psychiatry   Code Status: FULL Family Communication: no family present at time of exam Disposition Plan: stable for transfer to neuro unit - episodes of convulsions are not c/w seizure, and pt remains stable hemodynamically th/o - d/c home as soon as able to tolerate at least clear liquids and meds   Consultants: Psychiatry  Neuro  Procedures: EEG - 2/8 - normal EEG   Antibiotics: Acyclovir 2/7 > 2/9  DVT prophylaxis: SQ heparin   Objective: Blood pressure 118/64, pulse 56, temperature 98.7 F (37.1 C), temperature source Oral, resp. rate 15, height 5\' 5"  (1.651 m),  weight 72.9 kg (160 lb 11.5 oz), SpO2 98 %.  Intake/Output Summary (Last 24 hours) at 06/13/14 7106 Last data filed at 06/13/14 0900  Gross per 24 hour  Intake   2360 ml  Output   2100 ml  Net    260 ml   Exam: General: No acute respiratory distress - calm an pleasant th/o the conversation though reporting "horrible pain" in her abdom and face Lungs: Clear to auscultation bilaterally without wheezes or crackles Cardiovascular: Regular rate and rhythm without murmur gallop or rub normal S1 and S2 Abdomen: Nontender, nondistended, soft, bowel sounds positive, no rebound, no ascites, no appreciable mass Extremities: No significant cyanosis, clubbing, or edema bilateral lower extremities  Data Reviewed: Basic Metabolic Panel:  Recent Labs Lab 06/10/14 0815 06/11/14 0300 06/12/14 0130  NA 139 142 137  K 3.2* 3.1* 3.8  CL 100 111 108  CO2 26 21  18*  GLUCOSE 118* 90 85  BUN 8 <5* 9  CREATININE 1.44* 1.09 1.08  CALCIUM 9.6 8.4 8.6    Liver Function Tests: No results for input(s): AST, ALT, ALKPHOS, BILITOT, PROT, ALBUMIN in the last 168 hours. No results for input(s): LIPASE, AMYLASE in the last 168 hours. No results for input(s): AMMONIA in the last 168 hours.  CBC:  Recent Labs Lab 06/10/14 0815 06/11/14 0300 06/12/14 0130  WBC 9.2 7.5 8.8  NEUTROABS 7.0  --   --   HGB 13.0 12.0 11.3*  HCT 37.8 34.5* 34.0*  MCV 78.1 78.2 79.4  PLT 310 208 195   CBG:  Recent Labs Lab 06/10/14 0757  GLUCAP 112*    Recent Results (from the past 240 hour(s))  Urine culture     Status: None   Collection Time: 06/10/14  9:26 AM  Result Value Ref Range Status   Specimen Description URINE, CATHETERIZED  Final   Special Requests NONE  Final   Colony Count NO GROWTH Performed at [facility]   Final   Culture NO GROWTH Performed at [facility]   Final   Report Status 06/12/2014 FINAL  Final  CSF culture     Status: None (Preliminary result)   Collection  Time: 06/10/14 11:03 AM  Result Value Ref Range Status   Specimen Description CSF  Final   Special Requests Normal  Final   Gram Stain   Final    WBC PRESENT, PREDOMINANTLY MONONUCLEAR NO ORGANISMS SEEN CYTOSPIN Performed at [facility] Performed at [name]    Culture   Final    NO GROWTH 1 DAY Performed at [facility]    Report Status PENDING  Incomplete  Gram stain     Status: None   Collection Time: 06/10/14 11:03 AM  Result Value Ref Range Status   Specimen Description CSF  Final   Special Requests NONE  Final   Gram Stain   Final    CYTOSPIN SLIDE WBC PRESENT, PREDOMINANTLY MONONUCLEAR NO ORGANISMS SEEN    Report Status 06/10/2014 FINAL  Final  Herpes simplex virus culture     Status: None (Preliminary result)   Collection Time: 06/10/14 11:04 AM  Result Value Ref Range Status   Specimen Description VAGINA  Final   Special Requests NONE  Final   Culture   Final    Culture has been initiated. Performed at [facility]    Report Status PENDING  Incomplete  MRSA PCR Screening     Status: None   Collection Time: 06/10/14  6:05 PM  Result Value Ref Range Status   MRSA by PCR NEGATIVE NEGATIVE Final    Comment:        The GeneXpert MRSA Assay (FDA approved for NASAL specimens only), is one component of a comprehensive MRSA colonization surveillance program. It is not intended to diagnose MRSA infection nor to guide or monitor treatment for MRSA infections.      Studies:  Recent x-ray studies have been reviewed in detail by the Attending Physician  Scheduled Meds:  Scheduled Meds: . desmopressin  10 mcg Nasal BID  . feeding supplement (RESOURCE BREEZE)  1 Container Oral BID BM  . gabapentin  300 mg Oral TID  . heparin  5,000 Units Subcutaneous 3 times per day  . hydrocortisone sod succinate (SOLU-CORTEF) inj  50 mg Intravenous 3 times per day  . levETIRAcetam  1,500 mg Oral BID  . levothyroxine  100 mcg  Oral QAC  breakfast  . methocarbamol  500 mg Oral BID  . mirtazapine  15 mg Oral QHS  . potassium chloride  10 mEq Oral BID    Time spent on care of this patient: 35 mins   Shannyn Jankowiak T , MD   [facility] Office  [phone] Pager - Text Page per Shea Evans as per below:  On-Call/Text Page:      [email or web address]      password [email or web address]  If 7PM-7AM, please contact night-coverage [email or web address] Password [email or web address] 06/13/2014, 9:25 AM   LOS: 3 days

## 2014-06-13 NOTE — Progress Notes (Signed)
Pt. Arrived to 4N18.  Alert and Oriented.  Oriented to room, questions answered. Will continue to monitor.Bobbye Charleston, RN

## 2014-06-13 NOTE — Evaluation (Signed)
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    Clinical Impression  Pt participates well with PT today and at no time did she have any seizure-like activity.  Pt seemed to mobilize better when she was kept distracted in conversation.  At this time, pt will need 24hr S at home as she is not safe to be alone, unless symptoms and mobility improve.  Pt states her family has talked to her about moving to an ALF, and may need to resume this conversation with pt.  Feel that pt will need to have gait belt on during all mobility with Nsg for safety and incase pt has any more pseudoseizure type episodes.  Will continue to follow.      Follow Up Recommendations Home health PT;Supervision/Assistance - 24 hour    Equipment Recommendations  None recommended by PT    Recommendations for Other Services OT consult     Precautions / Restrictions Precautions Precautions: Fall Restrictions Weight Bearing Restrictions: No      Mobility  Bed Mobility Overal bed mobility: Modified Independent             General bed mobility comments: pt needs increased time, but no physical A or cueing needed.    Transfers Overall transfer level: Needs assistance Equipment used: None Transfers: Sit to/from Stand Sit to Stand: Min guard         General transfer comment: pt demos good use of UEs.    Ambulation/Gait Ambulation/Gait assistance: Min guard;+2 safety/equipment Ambulation Distance (Feet): 80 Feet Assistive device:  (pt held IV pole) Gait Pattern/deviations: Step-through pattern;Decreased stride length     General Gait Details: pt with moderate sway throughout mobility.  At no time did pt have a LOB or any seizure-like activity during ambulation.  When pt kept  distracted sway seemed to be more mild in nature.    Stairs            Wheelchair Mobility    Modified Rankin (Stroke Patients Only)       Balance Overall balance assessment: Needs assistance Sitting-balance support: No upper extremity supported;Feet supported Sitting balance-Leahy Scale: Good     Standing balance support: No upper extremity supported Standing balance-Leahy Scale: Fair Standing balance comment: Unable to accept balance challenges, but able to maintain standing.                               Pertinent Vitals/Pain Pain Assessment: 0-10 Pain Score: 10-Worst pain ever Pain Location: "All over." Pain Intervention(s): Monitored during session;Premedicated before session;Repositioned (RN present during session)    Home Living Family/patient expects to be discharged to:: Private residence Living Arrangements: Alone Available Help at Discharge: Family;Available PRN/intermittently Type of Home: Apartment Home Access: Stairs to enter Entrance Stairs-Rails: Right Entrance Stairs-Number of Steps: 1 flight Home Layout: One level Home Equipment: [place] - 4 wheels;Cane - single point Additional Comments: pt indicates her children can help her, but unclear to what extent they are available.      Prior Function Level of Independence: Independent with assistive device(s)               Hand Dominance        Extremity/Trunk Assessment   Upper Extremity Assessment: Overall WFL for tasks assessed  Lower Extremity Assessment: Generalized weakness      Cervical / Trunk Assessment: Normal  Communication   Communication: No difficulties  Cognition Arousal/Alertness: Awake/alert Behavior During Therapy: WFL for tasks assessed/performed Overall Cognitive Status: History of cognitive impairments - at baseline                      General Comments      Exercises        Assessment/Plan    PT Assessment Patient  needs continued PT services  PT Diagnosis Difficulty walking   PT Problem List Decreased strength;Decreased activity tolerance;Decreased balance;Decreased mobility;Decreased coordination;Decreased cognition;Decreased knowledge of use of DME  PT Treatment Interventions DME instruction;Gait training;Stair training;Functional mobility training;Therapeutic activities;Therapeutic exercise;Balance training;Neuromuscular re-education;Patient/family education   PT Goals (Current goals can be found in the Care Plan section) Acute Rehab PT Goals Patient Stated Goal: To feel better.   PT Goal Formulation: With patient Time For Goal Achievement: 06/27/14 Potential to Achieve Goals: Good    Frequency Min 3X/week   Barriers to discharge Decreased caregiver support Unclear how much children are available.      Co-evaluation               End of Session Equipment Utilized During Treatment: Gait belt Activity Tolerance: Patient tolerated treatment well Patient left: in bed;with call bell/phone within reach;with bed alarm set Nurse Communication: Mobility status         Time: 0938-1829 PT Time Calculation (min) (ACUTE ONLY): 27 min   Charges:   PT Evaluation $Initial PT Evaluation Tier I: 1 Procedure PT Treatments $Gait Training: 8-22 mins   PT G CodesCatarina Le, [place] 06/13/2014, 3:37 PM

## 2014-06-13 NOTE — Care Management Note (Signed)
    Page 1 of 2   06/24/2014     2:50:05 PM CARE MANAGEMENT NOTE 06/24/2014  Patient:  Shelly Le, Shelly Le   Account Number:  [id number]  Date Initiated:  06/13/2014  Documentation initiated by:  COLE,ANGELA  Subjective/Objective Assessment:   Pt admitted for seizures.     Action/Plan:   Pt from home alone. PT/OT evaluations ordered CM to f/u with needs.   Anticipated DC Date:  06/14/2014   Anticipated DC Plan:  [place]  CM consult      [facility] Choice  HOME HEALTH   Choice offered to / List presented to:  C-1 Patient        [place] arranged  HH-1 RN  [place].   Status of service:  Completed, signed off Medicare Important Message given?  YES (If response is "NO", the following Medicare IM given date fields will be blank) Date Medicare IM given:  06/13/2014 Medicare IM given by:  Whitman Hero Date Additional Medicare IM given:  06/18/2014 Additional Medicare IM given by:  Olga Coaster  Discharge Disposition:  [place]  Per UR Regulation:  Reviewed for med. necessity/level of care/duration of stay  If discussed at [place] of Stay Meetings, dates discussed:    Comments:  06/24/14 14:41 CM notes Last IM given less than 48 hours ago.  CM met with pt in room to confirm choice of [facility]. After first stating she did not talk to anyone about home health, she then stated [facility] was already set up for her.  CM asked permission to call her son and or daughter and she stated it was fine.  CM called Ronny Bacon and left message to please call me with no reference to name or reason (per HIIPAA); Ronny Bacon called back and Cm stated her mother was ready to be picked up from the hospital.  Ronny Bacon (politely) stated she'll be her before 5pm.  CM called [facility] rep, Colletta Maryland to notify of discharge and will be receiving HHPT/RN/SW.  No other CM needs were communicated.  Mariane Masters, BSN,  [facility]ear[facility] Lesch [phone].   06/22/2014- IM given - IM explained to the patient with all questions answered; Patient is to be discharged home within 24-48hrsAneta Mins [phone]  06/22/2014-Talked to patient with Darnelle Bos RN present about DCP; Informed patient that her Md was working on discharging her home soon / when stable and to offer [name] choices; Patient was active with [facility] prior to admission but was unable to see her due to her readmission ( to [facility]). CM asked patient if she wanted to stay with them or go with another [facility] agency Patient was agreeable to have them again; CM also asked patient was she interested in going to a SNF if she thought that she was unable to go home; patient stated that her family will be up this afternoon to see her and she will talk to them about that. Patient was very polite without any complaints. ****Later informed by patient's nurse that when I left the room, the patient rolled her eyes and stated "she does not know what she is talking about with [facility] and I want her name and she should be fired." B [employer] [phone]

## 2014-06-14 ENCOUNTER — Inpatient Hospital Stay (HOSPITAL_COMMUNITY): Payer: Medicare Other

## 2014-06-14 LAB — CBC
HCT: 34 % — ABNORMAL LOW (ref 36.0–46.0)
Hemoglobin: 11.6 g/dL — ABNORMAL LOW (ref 12.0–15.0)
MCH: 26.5 pg (ref 26.0–34.0)
MCHC: 34.1 g/dL (ref 30.0–36.0)
MCV: 77.6 fL — ABNORMAL LOW (ref 78.0–100.0)
PLATELETS: 197 10*3/uL (ref 150–400)
RBC: 4.38 MIL/uL (ref 3.87–5.11)
RDW: 12.8 % (ref 11.5–15.5)
WBC: 5.6 10*3/uL (ref 4.0–10.5)

## 2014-06-14 LAB — COMPREHENSIVE METABOLIC PANEL
ALT: 7 U/L (ref 0–35)
AST: 20 U/L (ref 0–37)
Albumin: 3.4 g/dL — ABNORMAL LOW (ref 3.5–5.2)
Alkaline Phosphatase: 56 U/L (ref 39–117)
Anion gap: 12 (ref 5–15)
BILIRUBIN TOTAL: 0.4 mg/dL (ref 0.3–1.2)
BUN: <5 mg/dL — ABNORMAL LOW (ref 6–23)
CALCIUM: 8.4 mg/dL (ref 8.4–10.5)
CO2: 22 mmol/L (ref 19–32)
CREATININE: 1.01 mg/dL (ref 0.50–1.10)
Chloride: 108 mmol/L (ref 96–112)
GFR, EST AFRICAN AMERICAN: 72 mL/min — AB (ref >90)
GFR, EST NON AFRICAN AMERICAN: 62 mL/min — AB (ref >90)
GLUCOSE: 137 mg/dL — AB (ref 70–99)
Potassium: 2.9 mmol/L — ABNORMAL LOW (ref 3.5–5.1)
SODIUM: 142 mmol/L (ref 135–145)
Total Protein: 5.8 g/dL — ABNORMAL LOW (ref 6.0–8.3)

## 2014-06-14 LAB — LIPASE, BLOOD: LIPASE: 69 U/L — AB (ref 11–59)

## 2014-06-14 LAB — CSF CULTURE

## 2014-06-14 LAB — CSF CULTURE W GRAM STAIN
Culture: NO GROWTH
Special Requests: NORMAL

## 2014-06-14 MED ORDER — BOOST PLUS PO LIQD
237.0000 mL | ORAL | Status: DC
  Administered 2014-06-14 – 2014-06-20 (×2): 237 mL via ORAL
  Filled 2014-06-14 (×14): qty 237

## 2014-06-14 MED ORDER — POTASSIUM CHLORIDE CRYS ER 20 MEQ PO TBCR
40.0000 meq | EXTENDED_RELEASE_TABLET | ORAL | Status: AC
  Administered 2014-06-14 (×2): 40 meq via ORAL
  Filled 2014-06-14 (×2): qty 2

## 2014-06-14 MED ORDER — WHITE PETROLATUM GEL
Status: AC
  Administered 2014-06-14: 01:00:00
  Filled 2014-06-14: qty 1

## 2014-06-14 MED ORDER — POTASSIUM CHLORIDE 10 MEQ/100ML IV SOLN: 10.0000 meq | INTRAVENOUS | Status: DC

## 2014-06-14 MED ORDER — BARIUM SULFATE 2.1 % PO SUSP
450.0000 mL | Freq: Once | ORAL | Status: AC
  Administered 2014-06-14: 450 mL via ORAL

## 2014-06-14 MED ORDER — POTASSIUM CHLORIDE 10 MEQ/100ML IV SOLN
10.0000 meq | INTRAVENOUS | Status: AC
  Administered 2014-06-14 (×4): 10 meq via INTRAVENOUS
  Filled 2014-06-14 (×5): qty 100

## 2014-06-14 MED ORDER — BOOST / RESOURCE BREEZE PO LIQD
1.0000 | ORAL | Status: DC
  Administered 2014-06-16 – 2014-06-22 (×5): 1 via ORAL

## 2014-06-14 NOTE — Consult Note (Signed)
Subjective:   HPI  The patient is a 54 year old female who we are asked to see in consultation in regards to generalized abdominal pain as well as nausea and vomiting which has been going on for about a week according to her. The pain is generalized and comes and goes. She states that she has a history of gastroparesis and has seen Dr. Derrill Kay at [facility] in the past. She has not seen him however since 2013. She states that back then she was having abdominal pain and nausea and vomiting. She apparently had tried Reglan but then said one of her doctors thought Reglan might be causing her a problem and therefore she was put on a medicine that she had to get from [place] which I suspect was domperidone, since this is a medicine that is used for gastroparesis but is not available in the [place]. At the present time in any event she does not appear in any distress and is not having any pain that I can tell in talking to her.  Review of Systems No chest pain or shortness of breath  Past Medical History  Diagnosis Date  . Addison's disease   . Diabetes insipidus   . Pneumonia   . Seizures   . Brain tumor    Past Surgical History  Procedure Laterality Date  . Tonsillectomy    . Abdominal hysterectomy    . Brain surgery      crainotomy   History   Social History  . Marital Status: Divorced    Spouse Name: N/A  . Number of Children: N/A  . Years of Education: N/A   Occupational History  . Not on file.   Social History Main Topics  . Smoking status: Never Smoker   . Smokeless tobacco: Never Used  . Alcohol Use: No  . Drug Use: No  . Sexual Activity: Not on file   Other Topics Concern  . Not on file   Social History Narrative   family history is not on file.  Current facility-administered medications:  .  0.9 %  sodium chloride infusion, , Intravenous, Continuous, Cherene Altes, MD, Last Rate: 50 mL/hr at 06/13/14 1826 .  acetaminophen (TYLENOL)  tablet 650 mg, 650 mg, Oral, Q6H PRN **OR** acetaminophen (TYLENOL) suppository 650 mg, 650 mg, Rectal, Q6H PRN, Jonetta Osgood, MD .  albuterol (PROVENTIL) (2.5 MG/3ML) 0.083% nebulizer solution 2.5 mg, 2.5 mg, Nebulization, Q2H PRN, Jonetta Osgood, MD .  desmopressin (DDAVP) 0.01 % nasal solution 10 mcg, 10 mcg, Nasal, BID, Jonetta Osgood, MD, 10 mcg at 06/14/14 1205 .  [START ON 06/15/2014] feeding supplement (RESOURCE BREEZE) (RESOURCE BREEZE) liquid 1 Container, 1 Container, Oral, Q24H, Reanne J Barnett, RD .  fentaNYL (DURAGESIC - dosed mcg/hr) patch 50 mcg, 50 mcg, Transdermal, Q72H, Cherene Altes, MD, 50 mcg at 06/13/14 1145 .  gabapentin (NEURONTIN) capsule 300 mg, 300 mg, Oral, TID, Jonetta Osgood, MD, 300 mg at 06/14/14 1051 .  guaiFENesin-dextromethorphan (ROBITUSSIN DM) 100-10 MG/5ML syrup 5 mL, 5 mL, Oral, Q4H PRN, Jonetta Osgood, MD .  heparin injection 5,000 Units, 5,000 Units, Subcutaneous, 3 times per day, Jonetta Osgood, MD, 5,000 Units at 06/14/14 0550 .  hydrocortisone sodium succinate (SOLU-CORTEF) 100 MG injection 25 mg, 25 mg, Intravenous, Q12H, Cherene Altes, MD, 25 mg at 06/14/14 0551 .  lactose free nutrition (BOOST PLUS) liquid 237 mL, 237 mL, Oral, Q24H, Reanne Maryland Pink, RD .  levETIRAcetam (KEPPRA)  IVPB 1000 mg/100 mL premix, 1,000 mg, Intravenous, Q12H, Cherene Altes, MD, 1,000 mg at 06/14/14 1051 .  levothyroxine (SYNTHROID, LEVOTHROID) tablet 100 mcg, 100 mcg, Oral, QAC breakfast, Eudelia Bunch, RPH, 100 mcg at 06/14/14 0815 .  LORazepam (ATIVAN) injection 1 mg, 1 mg, Intravenous, 3 times per day, Cherene Altes, MD, 1 mg at 06/14/14 0557 .  methocarbamol (ROBAXIN) tablet 500 mg, 500 mg, Oral, Q8H PRN, Cherene Altes, MD, 500 mg at 06/13/14 2041 .  mirtazapine (REMERON) tablet 15 mg, 15 mg, Oral, QHS, Jonetta Osgood, MD, 15 mg at 06/13/14 2204 .  polyvinyl alcohol (LIQUIFILM TEARS) 1.4 % ophthalmic solution 1 drop, 1 drop, Both  Eyes, PRN, Jonetta Osgood, MD .  potassium chloride 10 mEq in 100 mL IVPB, 10 mEq, Intravenous, Q1 Hr x 4, Ripudeep K Rai, MD, 10 mEq at 06/14/14 1233 .  potassium chloride SA (K-DUR,KLOR-CON) CR tablet 40 mEq, 40 mEq, Oral, Q4H, Ripudeep K Rai, MD, 40 mEq at 06/14/14 1141 .  promethazine (PHENERGAN) injection 12.5 mg, 12.5 mg, Intravenous, Q6H PRN, Geradine Girt, DO, 12.5 mg at 06/14/14 1121 Allergies  Allergen Reactions  . Contrast Media [Iodinated Diagnostic Agents] Shortness Of Breath and Rash  . Fentanyl Other (See Comments)    Witnessed grand tonic clonic seizure in ED while wearing patch (02/26/14); h/o seizure disorder (pt states she still uses patches as needed for pain)  . Morphine Hives and Shortness Of Breath  . Shellfish Allergy Anaphylaxis and Hives  . Penicillins Nausea And Vomiting    GI Intolerance  . Latex Rash  . Metrizamide Rash     Objective:     BP 127/60 mmHg  Pulse 85  Temp(Src) 99.9 F (37.7 C) (Oral)  Resp 16  Ht 5\' 5"  (1.651 m)  Wt 72.576 kg (160 lb)  BMI 26.63 kg/m2  SpO2 99%  She is in no distress  Nonicteric  Heart regular rhythm no murmurs  Lungs clear  Abdomen: Bowel sounds normal, soft, not distended, minimal generalized discomfort but no rebound or guarding  Laboratory No components found for: D1    Assessment:     Abdominal pain with associated nausea and vomiting. Etiology unclear  History of gastroparesis      Plan:     A CT scan of the abdomen and pelvis has been ordered. It's possible that she has a worsening of her gastroparesis again creating her nausea and vomiting. It may be reasonable to do a gastric emptying study in addition to the CT scan.

## 2014-06-14 NOTE — Progress Notes (Signed)
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  Apparently since 06/06/14 patient had been having intermittent fevers.  She started having tingling and numbness of her bilateral upper extremities, then persistent nausea with vomiting. She stated she had not been able to keep anything down. Patient was on the phone with her daughter when she began to have sx c/w her usual seizure activity. When the daughter arrived at the pt's place, patient was lying on a couch and was very lethargic.  They got into the car, and drove her from [name] to [facility]. Upon arrival to the emergency room, patient was noted to be anxious, rolling around and shaking. Although, there were no obvious seizure-like movements, because of a history of fever, headache and possible worsening seizures, patient underwent a lumbar puncture.   Assessment/Plan:  Nausea/vomiting/abdominal pain: unclear etiology, possibly worsening of gastroparesis - Mild elevation in lipase 69, LFTs normal, patient had MRI abdomen 11/15 showed 2.7 centimeter hemorrhagic left renal sinus cyst otherwise no abdominal pathology, upper quadrant ultrasound in 11/15 had shown minimal sludge in gallbladder otherwise normal - Unable to advance her diet due to multiple complaints of abdominal pain and nausea, requested gastroenterology consultation, discussed with Dr. Penelope Coop, recommended CT abdomen and pelvis w/o contrast (patient has contrast allergy) - Ordered nuclear medicine gastric empty study  Hypokalemia Replaced IV and oral  SIRS; no clear etiology - UA negative for UTI, chest x-ray negative for pneumonia, LP/CSF without evidence of meningitis/encephalitis, influenza panel negative -  CSF HSV negative    Seizures v/s pseudoseizures - Neurology was consulted, patient underwent MRI of the brain which did not show any mass or CVA  - EEG normal, no new recommendations from neurology  - Patient was seen by psychiatry and recommended continue current medication management without any changes and monitor for drug seeking behavior especially opiates and benzodiazepine.  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 not taking her hydrocortisone at home, continue IV hydrocortisone until taking adequate oral diet and no nausea or vomiting   DI Continue with DDAVP - Na and volume status currently normal   Adjustment D/O As per Psychiatry   Code Status: FULL  Family Communication: no family present at time of exam  Disposition Plan:   Consultants: Psychiatry  Neuro Gastroenterology  Procedures: EEG - 2/8 - normal EEG   Antibiotics: Acyclovir 2/7 > 2/9  DVT prophylaxis: SQ heparin   Subjective  Patient seen and examined, reported nausea with full liquid diet, diffuse abdominal pain, no diarrhea  Objective: Blood pressure 127/60, pulse 85, temperature 99.9 F (37.7 C), temperature source Oral, resp. rate 16, height 5\' 5"  (1.651 m), weight 72.576 kg (160 lb), SpO2 99 %.  Intake/Output Summary (Last 24 hours) at 06/14/14 1251 Last data filed at 06/13/14 2305  Gross per 24 hour  Intake 1847.5 ml  Output   1575 ml  Net  272.5 ml   Exam: General: alert and oriented x 3, NAD Lungs: CTAB Cardiovascular: Regular rate and  rhythm without murmur gallop or rub normal S1 and S2 Abdomen: "diffuse tenderness", nondistended, soft, bowel sounds positive Extremities: No significant cyanosis, clubbing,  or edema bilateral lower extremities  Data Reviewed: Basic Metabolic Panel:  Recent Labs Lab 06/10/14 0815 06/11/14 0300 06/12/14 0130 06/14/14 0835  NA 139 142 137 142  K 3.2* 3.1* 3.8 2.9*  CL 100 111 108 108  CO2 26 21 18* 22  GLUCOSE 118* 90 85 137*  BUN 8 <5* 9 <5*  CREATININE 1.44* 1.09 1.08 1.01  CALCIUM 9.6 8.4 8.6 8.4    Liver Function Tests:  Recent Labs Lab 06/14/14 0835  AST 20  ALT 7  ALKPHOS 56  BILITOT 0.4  PROT 5.8*  ALBUMIN 3.4*    Recent Labs Lab 06/14/14 0835  LIPASE 69*   No results for input(s): AMMONIA in the last 168 hours.  CBC:  Recent Labs Lab 06/10/14 0815 06/11/14 0300 06/12/14 0130 06/14/14 0835  WBC 9.2 7.5 8.8 5.6  NEUTROABS 7.0  --   --   --   HGB 13.0 12.0 11.3* 11.6*  HCT 37.8 34.5* 34.0* 34.0*  MCV 78.1 78.2 79.4 77.6*  PLT 310 208 195 197   CBG:  Recent Labs Lab 06/10/14 0757  GLUCAP 112*    Recent Results (from the past 240 hour(s))  Urine culture     Status: None   Collection Time: 06/10/14  9:26 AM  Result Value Ref Range Status   Specimen Description URINE, CATHETERIZED  Final   Special Requests NONE  Final   Colony Count NO GROWTH Performed at [facility]   Final   Culture NO GROWTH Performed at [facility]   Final   Report Status 06/12/2014 FINAL  Final  CSF culture     Status: None (Preliminary result)   Collection Time: 06/10/14 11:03 AM  Result Value Ref Range Status   Specimen Description CSF  Final   Special Requests Normal  Final   Gram Stain   Final    WBC PRESENT, PREDOMINANTLY MONONUCLEAR NO ORGANISMS SEEN CYTOSPIN Performed at [name] Performed at [facility]    Culture   Final    NO GROWTH 2 DAYS Performed at [facility]    Report Status PENDING  Incomplete  Gram stain     Status: None   Collection Time: 06/10/14 11:03 AM  Result Value Ref Range Status   Specimen Description CSF  Final   Special Requests NONE  Final    Gram Stain   Final    CYTOSPIN SLIDE WBC PRESENT, PREDOMINANTLY MONONUCLEAR NO ORGANISMS SEEN    Report Status 06/10/2014 FINAL  Final  Herpes simplex virus culture     Status: None   Collection Time: 06/10/14 11:04 AM  Result Value Ref Range Status   Specimen Description VAGINA  Final   Special Requests NONE  Final   Culture   Final    Herpes Simplex Type 2 detected. Performed at [facility]    Report Status 06/13/2014 FINAL  Final  Culture, blood (routine x 2)     Status: None (Preliminary result)   Collection Time: 06/10/14  1:30 PM  Result Value Ref Range Status   Specimen Description BLOOD LEFT HAND  Final   Special Requests BOTTLES DRAWN AEROBIC ONLY 5CC  Final   Culture   Final           BLOOD CULTURE RECEIVED NO GROWTH TO DATE CULTURE WILL BE HELD FOR 5  DAYS BEFORE ISSUING A FINAL NEGATIVE REPORT Performed at [facility]    Report Status PENDING  Incomplete  Culture, blood (routine x 2)     Status: None (Preliminary result)   Collection Time: 06/10/14  1:50 PM  Result Value Ref Range Status   Specimen Description BLOOD LEFT HAND  Final   Special Requests BOTTLES DRAWN AEROBIC ONLY 5 CC  Final   Culture   Final           BLOOD CULTURE RECEIVED NO GROWTH TO DATE CULTURE WILL BE HELD FOR 5 DAYS BEFORE ISSUING A FINAL NEGATIVE REPORT Performed at [facility]    Report Status PENDING  Incomplete  MRSA PCR Screening     Status: None   Collection Time: 06/10/14  6:05 PM  Result Value Ref Range Status   MRSA by PCR NEGATIVE NEGATIVE Final    Comment:        The GeneXpert MRSA Assay (FDA approved for NASAL specimens only), is one component of a comprehensive MRSA colonization surveillance program. It is not intended to diagnose MRSA infection nor to guide or monitor treatment for MRSA infections.      Studies:  Recent x-ray studies have been reviewed in detail by the Attending Physician  Scheduled Meds:  Scheduled Meds: .  desmopressin  10 mcg Nasal BID  . [START ON 06/15/2014] feeding supplement (RESOURCE BREEZE)  1 Container Oral Q24H  . fentaNYL  50 mcg Transdermal Q72H  . gabapentin  300 mg Oral TID  . heparin  5,000 Units Subcutaneous 3 times per day  . hydrocortisone sod succinate (SOLU-CORTEF) inj  25 mg Intravenous Q12H  . lactose free nutrition  237 mL Oral Q24H  . levETIRAcetam  1,000 mg Intravenous Q12H  . levothyroxine  100 mcg Oral QAC breakfast  . LORazepam  1 mg Intravenous 3 times per day  . mirtazapine  15 mg Oral QHS  . potassium chloride  10 mEq Intravenous Q1 Hr x 4  . potassium chloride  40 mEq Oral Q4H    Time spent on care of this patient: 35 mins    RAI,RIPUDEEP M.D. [facility] 06/14/2014, 2:26 PM  Pager: [phone]

## 2014-06-14 NOTE — Progress Notes (Signed)
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  Pt reports ongoing nausea and inability to tolerate any breakfast this morning. She tolerated 4 ounces of apple juice at time of visit. She is requesting Boost and Enlive nutritional supplements. Discussed supplement options at [facility]. Weight remains stable.   Labs: low potassium    Height: Ht Readings from Last 1 Encounters:  06/14/14 5\' 5"  (1.651 m)    Weight Status:   Wt Readings from Last 1 Encounters:  06/14/14 160 lb (72.576 kg)  06/10/14 160 lb  Re-estimated needs:  Kcal: 1600-1800 Protein: 80-95 g protein Fluid: >/= 1.6 L/day  Skin: lesion on tongue and inner perineum  Diet Order: Diet full liquid   Intake/Output Summary (Last 24 hours) at 06/14/14 1206 Last data filed at 06/13/14 2305  Gross per 24 hour  Intake 1847.5 ml  Output   1575 ml  Net  272.5 ml    Last BM: 2/10   Labs:   Recent Labs Lab 06/11/14 0300 06/12/14 0130 06/14/14 0835  NA 142 137 142  K 3.1* 3.8 2.9*  CL 111 108 108  CO2 21 18* 22  BUN <5* 9 <5*  CREATININE 1.09 1.08 1.01  CALCIUM 8.4 8.6 8.4  GLUCOSE 90 85 137*    CBG (last 3)  No results for input(s): GLUCAP in the last 72 hours.  Scheduled Meds: . desmopressin  10 mcg Nasal BID  . [START ON 06/15/2014] feeding supplement (RESOURCE BREEZE)  1 Container Oral Q24H  . fentaNYL  50 mcg Transdermal Q72H  . gabapentin  300  mg Oral TID  . heparin  5,000 Units Subcutaneous 3 times per day  . hydrocortisone sod succinate (SOLU-CORTEF) inj  25 mg Intravenous Q12H  . lactose free nutrition  237 mL Oral Q24H  . levETIRAcetam  1,000 mg Intravenous Q12H  . levothyroxine  100 mcg Oral QAC breakfast  . LORazepam  1 mg Intravenous 3 times per day  . mirtazapine  15 mg Oral QHS  . potassium chloride  10 mEq Intravenous Q1 Hr x 4  . potassium chloride  40 mEq Oral Q4H    Continuous Infusions: . sodium chloride 50 mL/hr at 06/13/14 [place], LDN Inpatient Clinical Dietitian Pager: [phone] After Hours Pager: [phone]

## 2014-06-15 ENCOUNTER — Inpatient Hospital Stay (HOSPITAL_COMMUNITY): Payer: Medicare Other

## 2014-06-15 LAB — RESPIRATORY VIRUS PANEL
Adenovirus: NEGATIVE
INFLUENZA B 1: NEGATIVE
Influenza A: NEGATIVE
METAPNEUMOVIRUS: NEGATIVE
PARAINFLUENZA 1 A: NEGATIVE
PARAINFLUENZA 2 A: NEGATIVE
Parainfluenza 3: NEGATIVE
Respiratory Syncytial Virus A: NEGATIVE
Respiratory Syncytial Virus B: NEGATIVE

## 2014-06-15 LAB — COMPREHENSIVE METABOLIC PANEL
ALBUMIN: 3.2 g/dL — AB (ref 3.5–5.2)
ALT: 6 U/L (ref 0–35)
AST: 20 U/L (ref 0–37)
Alkaline Phosphatase: 50 U/L (ref 39–117)
Anion gap: 11 (ref 5–15)
BILIRUBIN TOTAL: 0.7 mg/dL (ref 0.3–1.2)
BUN: <5 mg/dL — ABNORMAL LOW (ref 6–23)
CO2: 18 mmol/L — ABNORMAL LOW (ref 19–32)
CREATININE: 0.99 mg/dL (ref 0.50–1.10)
Calcium: 8.8 mg/dL (ref 8.4–10.5)
Chloride: 113 mmol/L — ABNORMAL HIGH (ref 96–112)
GFR calc Af Amer: 74 mL/min — ABNORMAL LOW (ref >90)
GFR calc non Af Amer: 63 mL/min — ABNORMAL LOW (ref >90)
Glucose, Bld: 77 mg/dL (ref 70–99)
Potassium: 4.8 mmol/L (ref 3.5–5.1)
Sodium: 142 mmol/L (ref 135–145)
TOTAL PROTEIN: 5.3 g/dL — AB (ref 6.0–8.3)

## 2014-06-15 MED ORDER — HYDROCORTISONE NA SUCCINATE PF 100 MG IJ SOLR
50.0000 mg | Freq: Once | INTRAMUSCULAR | Status: AC
  Administered 2014-06-15: 50 mg via INTRAVENOUS
  Filled 2014-06-15: qty 2

## 2014-06-15 MED ORDER — CETYLPYRIDINIUM CHLORIDE 0.05 % MT LIQD
7.0000 mL | Freq: Two times a day (BID) | OROMUCOSAL | Status: DC
  Administered 2014-06-15 – 2014-06-24 (×15): 7 mL via OROMUCOSAL

## 2014-06-15 MED ORDER — SODIUM CHLORIDE 0.9 % IV SOLN
Freq: Once | INTRAVENOUS | Status: AC
  Administered 2014-06-15: 15:00:00 via INTRAVENOUS

## 2014-06-15 MED ORDER — LORAZEPAM 2 MG/ML IJ SOLN
2.0000 mg | Freq: Once | INTRAMUSCULAR | Status: AC
  Administered 2014-06-15: 2 mg via INTRAVENOUS

## 2014-06-15 MED ORDER — TECHNETIUM TC 99M SULFUR COLLOID
2.0000 | Freq: Once | INTRAVENOUS | Status: AC | PRN
  Administered 2014-06-15: 2 via ORAL

## 2014-06-15 MED ORDER — LORAZEPAM 2 MG/ML IJ SOLN
INTRAMUSCULAR | Status: AC
  Administered 2014-06-15: 15:00:00
  Filled 2014-06-15: qty 1

## 2014-06-15 NOTE — Progress Notes (Signed)
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  Apparently since 06/06/14 patient had been having intermittent fevers.  She started having tingling and numbness of her bilateral upper extremities, then persistent nausea with vomiting. She stated she had not been able to keep anything down. Patient was on the phone with her daughter when she began to have sx c/w her usual seizure activity. When the daughter arrived at the pt's place, patient was lying on a couch and was very lethargic.  They got into the car, and drove her from [facility] to [facility]. Upon arrival to the emergency room, patient was noted to be anxious, rolling around and shaking. Although, there were no obvious seizure-like movements, because of a history of fever, headache and possible worsening seizures, patient underwent a lumbar puncture.   Assessment/Plan:  Nausea/vomiting/abdominal pain: unclear etiology, possibly worsening of gastroparesis - Mild elevation in lipase 69, LFTs normal, patient had MRI abdomen 11/15 showed 2.7 centimeter hemorrhagic left renal sinus cyst otherwise no abdominal pathology, upper quadrant ultrasound in 11/15 had shown minimal sludge in gallbladder otherwise normal - Unable to advance her diet due to multiple complaints of abdominal pain and nausea, requested gastroenterology consultation, discussed with Dr. Penelope Coop, recommended CT abdomen and pelvis w/o contrast (patient has contrast allergy) which showed possible gastroparesis - nuclear medicine gastric empty study pending today, cont NPO  Hypokalemia - resolved  SIRS; no clear etiology - UA negative for UTI, chest x-ray negative for pneumonia, LP/CSF without evidence of meningitis/encephalitis, influenza  panel negative -  CSF HSV negative   Seizures v/s pseudoseizures - Neurology was consulted, patient underwent MRI of the brain which did not show any mass or CVA  - EEG normal, no new recommendations from neurology  - Patient was seen by psychiatry and recommended continue current medication management without any changes and monitor for drug seeking behavior especially opiates and benzodiazepine.  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 not taking her hydrocortisone at home, continue IV hydrocortisone until taking adequate oral diet and no nausea or vomiting   DI Continue with DDAVP - Na and volume status currently normal   Adjustment D/O As per Psychiatry   Code Status: FULL  Family Communication: no family present at time of exam  Disposition Plan:   Consultants: Psychiatry  Neuro Gastroenterology  Procedures: EEG - 2/8 - normal EEG   Antibiotics: Acyclovir 2/7 > 2/9  DVT prophylaxis: SQ heparin   Subjective  Patient seen and examined, no new complaints, no acute issues overnight  Objective: Blood pressure 129/58, pulse 81, temperature 98.4 F (36.9 C), temperature source Oral, resp. rate 18, height 5\' 5"  (1.651 m), weight 72.576 kg (160 lb), SpO2 98 %. No intake or output data in the 24 hours ending 06/15/14 1120 Exam: General: alert and oriented x 3, NAD Lungs: CTAB Cardiovascular: Regular rate and rhythm without murmur gallop or rub normal S1 and S2 Abdomen: "diffuse tenderness", nondistended, soft, bowel sounds positive Extremities: No significant cyanosis, clubbing, or  edema bilateral lower extremities  Data Reviewed: Basic Metabolic Panel:  Recent Labs Lab  06/10/14 0815 06/11/14 0300 06/12/14 0130 06/14/14 0835 06/15/14 0528  NA 139 142 137 142 142  K 3.2* 3.1* 3.8 2.9* 4.8  CL 100 111 108 108 113*  CO2 26 21 18* 22 18*  GLUCOSE 118* 90 85 137* 77  BUN 8 <5* 9 <5* <5*  CREATININE 1.44* 1.09 1.08 1.01 0.99  CALCIUM 9.6 8.4 8.6 8.4 8.8    Liver Function Tests:  Recent Labs Lab 06/14/14 0835 06/15/14 0528  AST 20 20  ALT 7 6  ALKPHOS 56 50  BILITOT 0.4 0.7  PROT 5.8* 5.3*  ALBUMIN 3.4* 3.2*    Recent Labs Lab 06/14/14 0835  LIPASE 69*   No results for input(s): AMMONIA in the last 168 hours.  CBC:  Recent Labs Lab 06/10/14 0815 06/11/14 0300 06/12/14 0130 06/14/14 0835  WBC 9.2 7.5 8.8 5.6  NEUTROABS 7.0  --   --   --   HGB 13.0 12.0 11.3* 11.6*  HCT 37.8 34.5* 34.0* 34.0*  MCV 78.1 78.2 79.4 77.6*  PLT 310 208 195 197   CBG:  Recent Labs Lab 06/10/14 0757  GLUCAP 112*    Recent Results (from the past 240 hour(s))  Respiratory virus panel (routine influenza)     Status: None   Collection Time: 06/10/14  8:31 AM  Result Value Ref Range Status   Source - RVPAN NASOPHARYNGEAL  Corrected   Respiratory Syncytial Virus A Negative Negative Final   Respiratory Syncytial Virus B Negative Negative Final   Influenza A Negative Negative Final   Influenza B Negative Negative Final   Parainfluenza 1 Negative Negative Final   Parainfluenza 2 Negative Negative Final   Parainfluenza 3 Negative Negative Final   Metapneumovirus Negative Negative Final   Rhinovirus Comment Negative Final    Comment: (NOTE) We are UNABLE to reliably determine a result for the specimen due to the presence of PCR inhibitor(s) in the specimen submitted.  If clinically indicated, please recollect an additional specimen for testing.    Adenovirus Negative Negative Final    Comment: (NOTE) Performed At: [facility] [place], [place] [id number] Lindon Romp MD AS:[id number]   Urine culture     Status:  None   Collection Time: 06/10/14  9:26 AM  Result Value Ref Range Status   Specimen Description URINE, CATHETERIZED  Final   Special Requests NONE  Final   Colony Count NO GROWTH Performed at [facility]   Final   Culture NO GROWTH Performed at [facility]   Final   Report Status 06/12/2014 FINAL  Final  CSF culture     Status: None   Collection Time: 06/10/14 11:03 AM  Result Value Ref Range Status   Specimen Description CSF  Final   Special Requests Normal  Final   Gram Stain   Final    WBC PRESENT, PREDOMINANTLY MONONUCLEAR NO ORGANISMS SEEN CYTOSPIN Performed at [facility] Performed at [facility]    Culture   Final    NO GROWTH 3 DAYS Performed at [facility]    Report Status 06/14/2014 FINAL  Final  Gram stain     Status: None   Collection Time: 06/10/14 11:03 AM  Result Value Ref Range Status   Specimen Description CSF  Final   Special Requests NONE  Final   Gram Stain   Final    CYTOSPIN SLIDE  WBC PRESENT, PREDOMINANTLY MONONUCLEAR NO ORGANISMS SEEN    Report Status 06/10/2014 FINAL  Final  Herpes simplex virus culture     Status: None   Collection Time: 06/10/14 11:04 AM  Result Value Ref Range Status   Specimen Description VAGINA  Final   Special Requests NONE  Final   Culture   Final    Herpes Simplex Type 2 detected. Performed at [facility]    Report Status 06/13/2014 FINAL  Final  Culture, blood (routine x 2)     Status: None (Preliminary result)   Collection Time: 06/10/14  1:30 PM  Result Value Ref Range Status   Specimen Description BLOOD LEFT HAND  Final   Special Requests BOTTLES DRAWN AEROBIC ONLY 5CC  Final   Culture   Final           BLOOD CULTURE RECEIVED NO GROWTH TO DATE CULTURE WILL BE HELD FOR 5 DAYS BEFORE ISSUING A FINAL NEGATIVE REPORT Performed at [facility]    Report Status PENDING  Incomplete  Culture, blood (routine x 2)     Status: None (Preliminary result)    Collection Time: 06/10/14  1:50 PM  Result Value Ref Range Status   Specimen Description BLOOD LEFT HAND  Final   Special Requests BOTTLES DRAWN AEROBIC ONLY 5 CC  Final   Culture   Final           BLOOD CULTURE RECEIVED NO GROWTH TO DATE CULTURE WILL BE HELD FOR 5 DAYS BEFORE ISSUING A FINAL NEGATIVE REPORT Performed at [facility]    Report Status PENDING  Incomplete  MRSA PCR Screening     Status: None   Collection Time: 06/10/14  6:05 PM  Result Value Ref Range Status   MRSA by PCR NEGATIVE NEGATIVE Final    Comment:        The GeneXpert MRSA Assay (FDA approved for NASAL specimens only), is one component of a comprehensive MRSA colonization surveillance program. It is not intended to diagnose MRSA infection nor to guide or monitor treatment for MRSA infections.      Studies:  Recent x-ray studies have been reviewed in detail by the Attending Physician  Scheduled Meds:  Scheduled Meds: . desmopressin  10 mcg Nasal BID  . feeding supplement (RESOURCE BREEZE)  1 Container Oral Q24H  . fentaNYL  50 mcg Transdermal Q72H  . gabapentin  300 mg Oral TID  . heparin  5,000 Units Subcutaneous 3 times per day  . hydrocortisone sod succinate (SOLU-CORTEF) inj  25 mg Intravenous Q12H  . lactose free nutrition  237 mL Oral Q24H  . levETIRAcetam  1,000 mg Intravenous Q12H  . levothyroxine  100 mcg Oral QAC breakfast  . LORazepam  1 mg Intravenous 3 times per day  . mirtazapine  15 mg Oral QHS    Time spent on care of this patient: 25 mins    Hadley Detloff M.D. [facility] 06/15/2014, 11:20 AM  Pager: [phone]

## 2014-06-15 NOTE — Progress Notes (Signed)
Pt. In NM for scan. Nursing alerted to pt. Comment, 'I feel like Im going to have a seizure'. Pt. Seizes while in [place]. Pt. Placed on monitor. Ativan 2mg  IV given. Floor Rn notified, asked to come to NM. MD notified, arrived at bedside. Rapid response RN notified, at bedside.

## 2014-06-15 NOTE — Progress Notes (Signed)
Responded to request to visit the patient. The patient was awaiting a gastro procedure that would last four hours. The patient was watching television with all other lights off. Greeted the patient and introduced myself. The patient explained that she was here for many reasons and has a vast array of health concerns. Her father is a retired Company secretary, she comes from a supportive family, and would rather be at [name] with her endocrinologist and the regular team of doctors that know her history. She is on the wait list for a transplant. She waited a week before coming in as she didn't want to be in the the hospital. Patient is very vocal about her care. Provided emotional support, empathetic listening, and prayer. Patient is awaiting test results and discharge if all test are acceptable, per patient, she is ready to go home. Follow up with patient if not discharged.      06/15/14 0945  Clinical Encounter Type  Visited With Patient  Visit Type Initial;Spiritual support;Pre-op  Referral From Nurse  Spiritual Encounters  Spiritual Needs Emotional  Stress Factors  Patient Stress Factors Exhausted;Health changes   Drue Dun, Bonney Roussel Intern 06/15/2014, 10:11 AM

## 2014-06-15 NOTE — Significant Event (Signed)
Rapid Response Event Note  Overview: Time Called: 1500 Arrival Time: 1502 Event Type: Neurologic  Initial Focused Assessment: Called to patient's bedside while patient in [facility].  Patient's RN, Dr Tana Coast and radiology RN at bedside upon my arrival. Patient had received 2mg  Ativan IV Patient with positive gag, but otherwise minimally responsive. BP100/75 ST 133  RR 16. Placed on NRB O2 sats 100%  Interventions: NS bolus infusing.  Post bolus HR SR 88 BP 123/47 NS @ 125/hr Completed testing then transported patient back to 4N. On heart monitor and O2 via [place] O2 sat 97% Patient beginning to arouse, but states that she is very sleepy.  MAE equally. Plan transfer patient to SDU for closer monitoring. RN to call if assistance needed  Event Summary: Name of Physician Notified: DR Rai at bedside upon my arrival at      at    Outcome: Transferred (Comment) [phone])  Event End Time: [place]  Raliegh Ip

## 2014-06-15 NOTE — Progress Notes (Signed)
Pt transported out of unit per bed to [place] with nurse and student nurse. No acute distress noted.   Angeline Slim I 06/15/2014 5:54 PM

## 2014-06-15 NOTE — Progress Notes (Signed)
Report given to [facility], RN in [name].  Will transport pt per bed with nurse and nurse tech.  Angeline Slim I 06/15/2014 5:19 PM

## 2014-06-15 NOTE — Progress Notes (Signed)
PT Cancellation Note  Patient Details Name: TAHJANAE BLANKENBURG MRN: [id number] DOB: 04-29-1961   Cancelled Treatment:    Reason Eval/Treat Not Completed: Patient at procedure or test/unavailable . Patient going down for Gastric emptying study. Will be gone for 4-5 hours. Will follow up at another time  Jacqualyn Posey 06/15/2014, 10:43 AM

## 2014-06-15 NOTE — Progress Notes (Signed)
Notified MD per pt's request for pain medication.  Pt stated "Tylenol does not help". Pt drowsy, pt had nausea earlier and phenergan given.   Pt does have a fentanyl patch on per MD no new orders at this time. Vs stable. Pt made aware.  Will continue to monitor. Saunders Revel T

## 2014-06-16 LAB — BASIC METABOLIC PANEL
Anion gap: 6 (ref 5–15)
BUN: <5 mg/dL — ABNORMAL LOW (ref 6–23)
CALCIUM: 8.5 mg/dL (ref 8.4–10.5)
CO2: 25 mmol/L (ref 19–32)
CREATININE: 0.79 mg/dL (ref 0.50–1.10)
Chloride: 108 mmol/L (ref 96–112)
GFR calc Af Amer: >90 mL/min (ref >90)
Glucose, Bld: 94 mg/dL (ref 70–99)
Potassium: 3.4 mmol/L — ABNORMAL LOW (ref 3.5–5.1)
Sodium: 139 mmol/L (ref 135–145)

## 2014-06-16 MED ORDER — POTASSIUM CHLORIDE 10 MEQ/100ML IV SOLN
10.0000 meq | INTRAVENOUS | Status: AC
  Administered 2014-06-16 (×3): 10 meq via INTRAVENOUS
  Filled 2014-06-16 (×2): qty 100

## 2014-06-16 MED ORDER — SODIUM CHLORIDE 0.9 % IJ SOLN
10.0000 mL | Freq: Two times a day (BID) | INTRAMUSCULAR | Status: DC
  Administered 2014-06-16 – 2014-06-20 (×7): 10 mL

## 2014-06-16 MED ORDER — SODIUM CHLORIDE 0.9 % IJ SOLN
10.0000 mL | INTRAMUSCULAR | Status: DC | PRN
  Administered 2014-06-18: 10 mL
  Administered 2014-06-20: 20 mL
  Administered 2014-06-21 – 2014-06-24 (×2): 10 mL
  Filled 2014-06-16 (×4): qty 40

## 2014-06-16 MED ORDER — TIZANIDINE HCL 4 MG PO TABS
8.0000 mg | ORAL_TABLET | Freq: Three times a day (TID) | ORAL | Status: DC | PRN
  Administered 2014-06-16 – 2014-06-24 (×22): 8 mg via ORAL
  Filled 2014-06-16 (×28): qty 2

## 2014-06-16 MED ORDER — TIZANIDINE HCL 4 MG PO TABS
8.0000 mg | ORAL_TABLET | Freq: Once | ORAL | Status: AC
  Administered 2014-06-16: 8 mg via ORAL
  Filled 2014-06-16: qty 2

## 2014-06-16 NOTE — Progress Notes (Signed)
Called to pt's room by NT. Pt felt like she was about to have SZ.  Upon arriving at bedside with second RN pt was intermittently shifting in bed.  Moaning continued for 2 to 3 minutes.  Hr did go up to 140 then returned to normal 70's 80's.  Vs normal.  Pupils equal, react and gazing forward without movement.  Reposition pt and placed pillows at side rails for safety.  Raised arm over head and when dropped pt's arm avoided face.   Pt later upset and c/o being left alone during episode.  Md aware. Will continue to monitor. Saunders Revel T

## 2014-06-16 NOTE — Progress Notes (Signed)
Peripherally Inserted Central Catheter/Midline Placement  The IV Nurse has discussed with the patient and/or persons authorized to consent for the patient, the purpose of this procedure and the potential benefits and risks involved with this procedure.  The benefits include less needle sticks, lab draws from the catheter and patient may be discharged home with the catheter.  Risks include, but not limited to, infection, bleeding, blood clot (thrombus formation), and puncture of an artery; nerve damage and irregular heat beat.  Alternatives to this procedure were also discussed.  PICC/Midline Placement Documentation  PICC / Midline Single Lumen 62/56/38 PICC Right Basilic 35 cm 0 cm (Active)  Indication for Insertion or Continuance of Line Poor Vasculature-patient has had multiple peripheral attempts or PIVs lasting less than 24 hours 06/16/2014 12:35 PM  Exposed Catheter (cm) 0 cm 06/16/2014 12:35 PM  Line Status Flushed;Saline locked;Blood return noted 06/16/2014 12:35 PM  Dressing Change Due 06/23/14 06/16/2014 12:35 PM       Gordan Payment 06/16/2014, 12:37 PM

## 2014-06-16 NOTE — Progress Notes (Signed)
Subjective: Nausea and vomiting improving slowly. Abdominal pain improving slowly.  Objective: Vital signs in last 24 hours: Temp:  [97.4 F (36.3 C)-98.8 F (37.1 C)] 97.4 F (36.3 C) (02/13 1600) Pulse Rate:  [56-99] 56 (02/13 1600) Resp:  [10-21] 17 (02/13 1600) BP: (96-151)/(49-83) 96/53 mmHg (02/13 1600) SpO2:  [96 %-100 %] 100 % (02/13 1600) Weight change:  Last BM Date: 06/15/14  PE: GEN:  Somnolent, but arousable ABD:  Soft  Lab Results: CBC    Component Value Date/Time   WBC 5.6 06/14/2014 0835   RBC 4.38 06/14/2014 0835   RBC 3.11* 02/28/2014 0850   HGB 11.6* 06/14/2014 0835   HCT 34.0* 06/14/2014 0835   PLT 197 06/14/2014 0835   MCV 77.6* 06/14/2014 0835   MCH 26.5 06/14/2014 0835   MCHC 34.1 06/14/2014 0835   RDW 12.8 06/14/2014 0835   LYMPHSABS 1.8 06/10/2014 0815   MONOABS 0.3 06/10/2014 0815   EOSABS 0.0 06/10/2014 0815   BASOSABS 0.0 06/10/2014 0815   CMP     Component Value Date/Time   NA 139 06/16/2014 0255   K 3.4* 06/16/2014 0255   CL 108 06/16/2014 0255   CO2 25 06/16/2014 0255   GLUCOSE 94 06/16/2014 0255   BUN <5* 06/16/2014 0255   CREATININE 0.79 06/16/2014 0255   CALCIUM 8.5 06/16/2014 0255   PROT 5.3* 06/15/2014 0528   ALBUMIN 3.2* 06/15/2014 0528   AST 20 06/15/2014 0528   ALT 6 06/15/2014 0528   ALKPHOS 50 06/15/2014 0528   BILITOT 0.7 06/15/2014 0528   GFRNONAA >90 06/16/2014 0255   GFRAA >90 06/16/2014 0255   Studies/Results: Nm Gastric Emptying  06/15/2014   CLINICAL DATA:  Nausea, vomiting and abdominal pain. Evaluate for gastroparesis.  EXAM: NUCLEAR MEDICINE GASTRIC EMPTYING SCAN  TECHNIQUE: After oral ingestion of radiolabeled meal, sequential abdominal images were obtained for 4 hours. Percentage of activity emptying the stomach calculated at 1 hour, 2 hour, 3 hour, and 4 hours.  RADIOPHARMACEUTICALS:  2.0 mCi Technetium 99-m labeled sulfur colloid  COMPARISON:  CT abdomen pelvis -06/14/2014  FINDINGS: Expected location  of the stomach in the left upper quadrant. Ingested meal empties the stomach gradually over the course of the study.  84% emptied at 4 hr ( normal >= 90%).  IMPRESSION: Mildly delayed gastric emptying study with emptying of approximately 84% of gastric contents on the 4 hr anterior projection planar image (normal > 90%).   Electronically Signed   By: Sandi Mariscal M.D.   On: 06/15/2014 16:14   Assessment:  1.  Nausea and vomiting, slowly improving. 2.  Mild dilated stomach on CT scan; gastric emptying study shows mildly delayed gastric emptying. 3.  Seizure disorder, precluding our ability to use metoclopramide.  Plan:  1.  Gastroparesis-type diet. 2.  Antiemetics as needed. 3.  If nausea and vomiting don't continue to progressively improve over the next few days, would consider endoscopy. 4.  Will revisit Monday.   Landry Dyke 06/16/2014, 4:53 PM

## 2014-06-16 NOTE — Progress Notes (Signed)
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  Apparently since 06/06/14 patient had been having intermittent fevers.  She started having tingling and numbness of her bilateral upper extremities, then persistent nausea with vomiting. She stated she had not been able to keep anything down. Patient was on the phone with her daughter when she began to have sx c/w her usual seizure activity. When the daughter arrived at the pt's place, patient was lying on a couch and was very lethargic.  They got into the car, and drove her from [facility] to [facility]. Upon arrival to the emergency room, patient was noted to be anxious, rolling around and shaking. Although, there were no obvious seizure-like movements, because of a history of fever, headache and possible worsening seizures, patient underwent a lumbar puncture.   Assessment/Plan:  Nausea/vomiting/abdominal pain: unclear etiology, possibly worsening of gastroparesis - LFTs normal, patient had MRI abdomen 11/15 showed 2.7 centimeter hemorrhagic left renal sinus cyst otherwise no abdominal pathology, upper quadrant ultrasound in 11/15 had shown minimal sludge in gallbladder otherwise normal - CT abdomen and pelvis w/o contrast (patient has contrast allergy) which showed possible gastroparesis - nuclear medicine gastric empty study showed mild gastroparesis, follow GI recommendations - And diet advanced to full liquids, cannot place her on Reglan as it lowers seizure threshold  Seizures v/s pseudoseizures - Neurology was consulted, patient underwent MRI of the brain which did not show any mass or CVA  - EEG was normal, no new recommendations from neurology  - Patient was seen by psychiatry, Dr. Kathie Dike on 2/9  and recommended continue current medication management without any changes and monitor for drug seeking behavior especially opiates and benzodiazepine. - On 2/12 patient had another episode of seizure like activity while undergoing gastric empty study- she reported to the RN that she felt like seizure coming and subsequently on my examination was oriented. Patient was given Ativan 2 mg IV during the seizure activity and patient was transferred to stepdown unit.  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 not taking her hydrocortisone at home, continue IV hydrocortisone until taking adequate oral diet and no nausea or vomiting  Patient was given hydrocortisone 50 mg IV 1 as she was somewhat hypotensive during her seizure like activity for stress dose  DI Continue with DDAVP - Na and volume status currently normal   Adjustment D/O As per Psychiatry   Code Status: FULL  Family Communication: no family present at time of exam  Disposition Plan:   Consultants: Psychiatry  Neuro Gastroenterology  Procedures: EEG - 2/8 - normal EEG   Antibiotics: Acyclovir 2/7 > 2/9  DVT prophylaxis: SQ heparin   Subjective  Patient seen and examined, continues to complain of abdominal pain, nausea, no seizure-like activity overnight  Objective: Blood pressure 119/67, pulse  59, temperature 97.9 F (36.6 C), temperature source Oral, resp. rate 20, height 5\' 5"  (1.651 m), weight 72.576 kg (160 lb), SpO2 96  %.  Intake/Output Summary (Last 24 hours) at 06/16/14 1044 Last data filed at 06/16/14 0800  Gross per 24 hour  Intake   3920 ml  Output   2650 ml  Net   1270 ml   Exam: General: alert and oriented x 3, NAD Lungs: CTAB Cardiovascular: RRR, normal S1 and S2 Abdomen: "diffuse tenderness", nondistended, soft, bowel sounds positive Extremities: No significant cyanosis, clubbing, or edema bilateral lower extremities  Data Reviewed: Basic Metabolic Panel:  Recent Labs Lab 06/11/14 0300 06/12/14 0130 06/14/14 0835 06/15/14 0528 06/16/14 0255  NA 142 137 142 142 139  K 3.1* 3.8 2.9* 4.8 3.4*  CL 111 108 108 113* 108  CO2 21 18* 22 18* 25  GLUCOSE 90 85 137* 77 94  BUN <5* 9 <5* <5* <5*  CREATININE 1.09 1.08 1.01 0.99 0.79  CALCIUM 8.4 8.6 8.4 8.8 8.5    Liver Function Tests:  Recent Labs Lab 06/14/14 0835 06/15/14 0528  AST 20 20  ALT 7 6  ALKPHOS 56 50  BILITOT 0.4 0.7  PROT 5.8* 5.3*  ALBUMIN 3.4* 3.2*    Recent Labs Lab 06/14/14 0835  LIPASE 69*   No results for input(s): AMMONIA in the last 168 hours.  CBC:  Recent Labs Lab 06/10/14 0815 06/11/14 0300 06/12/14 0130 06/14/14 0835  WBC 9.2 7.5 8.8 5.6  NEUTROABS 7.0  --   --   --   HGB 13.0 12.0 11.3* 11.6*  HCT 37.8 34.5* 34.0* 34.0*  MCV 78.1 78.2 79.4 77.6*  PLT 310 208 195 197   CBG:  Recent Labs Lab 06/10/14 0757  GLUCAP 112*    Recent Results (from the past 240 hour(s))  Respiratory virus panel (routine influenza)     Status: None   Collection Time: 06/10/14  8:31 AM  Result Value Ref Range Status   Source - RVPAN NASOPHARYNGEAL  Corrected   Respiratory Syncytial Virus A Negative Negative Final   Respiratory Syncytial Virus B Negative Negative Final   Influenza A Negative Negative Final   Influenza B Negative Negative Final   Parainfluenza 1 Negative Negative Final   Parainfluenza 2 Negative Negative Final   Parainfluenza 3 Negative Negative Final   Metapneumovirus Negative  Negative Final   Rhinovirus Comment Negative Final    Comment: (NOTE) We are UNABLE to reliably determine a result for the specimen due to the presence of PCR inhibitor(s) in the specimen submitted.  If clinically indicated, please recollect an additional specimen for testing.    Adenovirus Negative Negative Final    Comment: (NOTE) Performed At: [facility] [place], [place] [id number] Lindon Romp MD UK:[id number]   Urine culture     Status: None   Collection Time: 06/10/14  9:26 AM  Result Value Ref Range Status   Specimen Description URINE, CATHETERIZED  Final   Special Requests NONE  Final   Colony Count NO GROWTH Performed at [name]   Final   Culture NO GROWTH Performed at [facility]   Final   Report Status 06/12/2014 FINAL  Final  CSF culture     Status: None   Collection Time: 06/10/14 11:03 AM  Result Value Ref Range Status   Specimen Description CSF  Final   Special Requests Normal  Final   Gram Stain   Final  WBC PRESENT, PREDOMINANTLY MONONUCLEAR NO ORGANISMS SEEN CYTOSPIN Performed at [facility] Performed at [place]   Final    NO GROWTH 3 DAYS Performed at [facility]    Report Status 06/14/2014 FINAL  Final  Gram stain     Status: None   Collection Time: 06/10/14 11:03 AM  Result Value Ref Range Status   Specimen Description CSF  Final   Special Requests NONE  Final   Gram Stain   Final    CYTOSPIN SLIDE WBC PRESENT, PREDOMINANTLY MONONUCLEAR NO ORGANISMS SEEN    Report Status 06/10/2014 FINAL  Final  Herpes simplex virus culture     Status: None   Collection Time: 06/10/14 11:04 AM  Result Value Ref Range Status   Specimen Description VAGINA  Final   Special Requests NONE  Final   Culture   Final    Herpes Simplex Type 2 detected. Performed at [facility]    Report Status 06/13/2014 FINAL  Final  Culture, blood (routine x 2)     Status:  None (Preliminary result)   Collection Time: 06/10/14  1:30 PM  Result Value Ref Range Status   Specimen Description BLOOD LEFT HAND  Final   Special Requests BOTTLES DRAWN AEROBIC ONLY 5CC  Final   Culture   Final           BLOOD CULTURE RECEIVED NO GROWTH TO DATE CULTURE WILL BE HELD FOR 5 DAYS BEFORE ISSUING A FINAL NEGATIVE REPORT Performed at [facility]    Report Status PENDING  Incomplete  Culture, blood (routine x 2)     Status: None (Preliminary result)   Collection Time: 06/10/14  1:50 PM  Result Value Ref Range Status   Specimen Description BLOOD LEFT HAND  Final   Special Requests BOTTLES DRAWN AEROBIC ONLY 5 CC  Final   Culture   Final           BLOOD CULTURE RECEIVED NO GROWTH TO DATE CULTURE WILL BE HELD FOR 5 DAYS BEFORE ISSUING A FINAL NEGATIVE REPORT Performed at [facility]    Report Status PENDING  Incomplete  MRSA PCR Screening     Status: None   Collection Time: 06/10/14  6:05 PM  Result Value Ref Range Status   MRSA by PCR NEGATIVE NEGATIVE Final    Comment:        The GeneXpert MRSA Assay (FDA approved for NASAL specimens only), is one component of a comprehensive MRSA colonization surveillance program. It is not intended to diagnose MRSA infection nor to guide or monitor treatment for MRSA infections.      Studies:  Recent x-ray studies have been reviewed in detail by the Attending Physician  Scheduled Meds:  Scheduled Meds: . antiseptic oral rinse  7 mL Mouth Rinse BID  . desmopressin  10 mcg Nasal BID  . feeding supplement (RESOURCE BREEZE)  1 Container Oral Q24H  . fentaNYL  50 mcg Transdermal Q72H  . gabapentin  300 mg Oral TID  . heparin  5,000 Units Subcutaneous 3 times per day  . hydrocortisone sod succinate (SOLU-CORTEF) inj  25 mg Intravenous Q12H  . lactose free nutrition  237 mL Oral Q24H  . levETIRAcetam  1,000 mg Intravenous Q12H  . levothyroxine  100 mcg Oral QAC breakfast  . LORazepam  1 mg Intravenous 3  times per day  . mirtazapine  15 mg Oral QHS    Time spent on care of  this patient: 25 mins    Maelin Kurkowski M.D. [facility] 06/16/2014, 10:44 AM  Pager: [phone]

## 2014-06-17 LAB — CULTURE, BLOOD (ROUTINE X 2)
CULTURE: NO GROWTH
CULTURE: NO GROWTH

## 2014-06-17 LAB — BASIC METABOLIC PANEL
Anion gap: 7 (ref 5–15)
BUN: <5 mg/dL — ABNORMAL LOW (ref 6–23)
CHLORIDE: 111 mmol/L (ref 96–112)
CO2: 24 mmol/L (ref 19–32)
CREATININE: 0.9 mg/dL (ref 0.50–1.10)
Calcium: 8.8 mg/dL (ref 8.4–10.5)
GFR, EST AFRICAN AMERICAN: 83 mL/min — AB (ref >90)
GFR, EST NON AFRICAN AMERICAN: 71 mL/min — AB (ref >90)
GLUCOSE: 86 mg/dL (ref 70–99)
Potassium: 3.2 mmol/L — ABNORMAL LOW (ref 3.5–5.1)
Sodium: 142 mmol/L (ref 135–145)

## 2014-06-17 MED ORDER — KETOROLAC TROMETHAMINE 30 MG/ML IJ SOLN
30.0000 mg | Freq: Once | INTRAMUSCULAR | Status: DC
  Filled 2014-06-17 (×2): qty 1

## 2014-06-17 MED ORDER — POTASSIUM CHLORIDE CRYS ER 20 MEQ PO TBCR
40.0000 meq | EXTENDED_RELEASE_TABLET | Freq: Every day | ORAL | Status: DC
  Administered 2014-06-17: 40 meq via ORAL
  Filled 2014-06-17: qty 2

## 2014-06-17 MED ORDER — POTASSIUM CHLORIDE 10 MEQ/100ML IV SOLN
10.0000 meq | INTRAVENOUS | Status: AC
  Administered 2014-06-17 (×3): 10 meq via INTRAVENOUS
  Filled 2014-06-17 (×3): qty 100

## 2014-06-17 NOTE — Progress Notes (Signed)
Patient's BP 80/49. Neuro assessment remains unchanged. MD paged. Will continue IV fluids. Will continue to monitor.

## 2014-06-17 NOTE — Progress Notes (Addendum)
Clarified with Dr. Tana Coast, patient to be transferred to [facility] when bed available. Shelly Le  Clarified with Dr. Tana Coast a 2nd time and patient to be transferred to [facility] (11:30)

## 2014-06-17 NOTE — Progress Notes (Signed)
Patient still drowsy. BP 91/51. Pain medication held.

## 2014-06-17 NOTE — Progress Notes (Signed)
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  Apparently since 06/06/14 patient had been having intermittent fevers.  She started having tingling and numbness of her bilateral upper extremities, then persistent nausea with vomiting. She stated she had not been able to keep anything down. Patient was on the phone with her daughter when she began to have sx c/w her usual seizure activity. When the daughter arrived at the pt's place, patient was lying on a couch and was very lethargic.  They got into the car, and drove her from [facility] to [facility]. Upon arrival to the emergency room, patient was noted to be anxious, rolling around and shaking. Although, there were no obvious seizure-like movements, because of a history of fever, headache and possible worsening seizures, patient underwent a lumbar puncture.   Assessment/Plan:  Nausea/vomiting/abdominal pain: unclear etiology, possibly worsening of gastroparesis - LFTs normal, patient had MRI abdomen 11/15 showed 2.7 centimeter hemorrhagic left renal sinus cyst otherwise no abdominal pathology, upper quadrant ultrasound in 11/15 had shown minimal sludge in gallbladder otherwise normal - CT abdomen and pelvis w/o contrast (patient has contrast allergy) which showed possible gastroparesis - nuclear medicine gastric empty study showed mild gastroparesis, follow GI recommendations -  diet advanced to full liquids, cannot place her on Reglan as it lowers seizure threshold  Seizures v/s pseudoseizures - Neurology was consulted, patient underwent MRI of the brain which did not show any mass or CVA  - EEG was normal, no new recommendations from neurology  - Patient was seen by psychiatry, Dr. Kathie Dike on 2/9 and  recommended continue current medication management without any changes and monitor for drug seeking behavior especially opiates and benzodiazepine. 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   Hypokalemia -Placed on daily replacement  SIRS; no clear etiology - UA negative for UTI, chest x-ray negative for pneumonia, LP/CSF without evidence of meningitis/encephalitis, influenza panel negative -  CSF HSV negative   Acute renal failure prerenal azotemia given history of recurrent nausea and vomiting -  creatinine  peaked at 1.44 - Currently stable    Narcotic seeking behavior - Previous records reviewed, will avoid narcotics as it can exacerbate and worsen gastroparesis    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 not taking her hydrocortisone at home, continue IV hydrocortisone until taking adequate oral diet and no nausea or vomiting   DI Continue with DDAVP - Na and volume status currently normal   Adjustment D/O As per Psychiatry   Code Status: FULL  Family Communication: no family present at time of exam  Disposition Plan: Transfer to neuro tele floor today  Consultants: Psychiatry  Neuro Gastroenterology  Procedures: EEG - 2/8 - normal EEG   Antibiotics: Acyclovir 2/7 > 2/9  DVT prophylaxis: SQ heparin   Subjective  Patient seen and examined, no acute complaints this morning however had again "seizure-like activity"last night. Per night RN examination, her arm was raised and she continued to avoid her face when dropped, called RN to notify that her seizure was coming on  Objective: Blood pressure 97/43, pulse 62, temperature 98.8 F (37.1 C), temperature source Oral, resp. rate 13, height 5\' 5"  (1.651 m), weight 72.576 kg (160 lb),  SpO2 97 %.  Intake/Output Summary (Last 24 hours) at 06/17/14 1006 Last data filed at 06/17/14 0900  Gross per 24 hour  Intake    3700 ml  Output   3501 ml  Net    199 ml   Exam: General: alert and oriented x 3, NAD Lungs: CTAB Cardiovascular: RRR, normal S1 and S2 Abdomen: "diffuse tenderness", nondistended, soft, bowel sounds positive Extremities: No c/c/e b /l  Data Reviewed: Basic Metabolic Panel:  Recent Labs Lab 06/12/14 0130 06/14/14 0835 06/15/14 0528 06/16/14 0255 06/17/14 0410  NA 137 142 142 139 142  K 3.8 2.9* 4.8 3.4* 3.2*  CL 108 108 113* 108 111  CO2 18* 22 18* 25 24  GLUCOSE 85 137* 77 94 86  BUN 9 <5* <5* <5* <5*  CREATININE 1.08 1.01 0.99 0.79 0.90  CALCIUM 8.6 8.4 8.8 8.5 8.8    Liver Function Tests:  Recent Labs Lab 06/14/14 0835 06/15/14 0528  AST 20 20  ALT 7 6  ALKPHOS 56 50  BILITOT 0.4 0.7  PROT 5.8* 5.3*  ALBUMIN 3.4* 3.2*    Recent Labs Lab 06/14/14 0835  LIPASE 69*   No results for input(s): AMMONIA in the last 168 hours.  CBC:  Recent Labs Lab 06/11/14 0300 06/12/14 0130 06/14/14 0835  WBC 7.5 8.8 5.6  HGB 12.0 11.3* 11.6*  HCT 34.5* 34.0* 34.0*  MCV 78.2 79.4 77.6*  PLT 208 195 197   CBG: No results for input(s): GLUCAP in the last 168 hours.  Recent Results (from the past 240 hour(s))  Respiratory virus panel (routine influenza)     Status: None   Collection Time: 06/10/14  8:31 AM  Result Value Ref Range Status   Source - RVPAN NASOPHARYNGEAL  Corrected   Respiratory Syncytial Virus A Negative Negative Final   Respiratory Syncytial Virus B Negative Negative Final   Influenza A Negative Negative Final   Influenza B Negative Negative Final   Parainfluenza 1 Negative Negative Final   Parainfluenza 2 Negative Negative Final   Parainfluenza 3 Negative Negative Final   Metapneumovirus Negative Negative Final   Rhinovirus Comment Negative Final    Comment: (NOTE) We are UNABLE to reliably determine a result for the specimen due to the presence of PCR inhibitor(s) in the specimen submitted.  If clinically indicated, please recollect an  additional specimen for testing.    Adenovirus Negative Negative Final    Comment: (NOTE) Performed At: [facility] [place], [place] [id number] Lindon Romp MD VQ:[id number]   Urine culture     Status: None   Collection Time: 06/10/14  9:26 AM  Result Value Ref Range Status   Specimen Description URINE, CATHETERIZED  Final   Special Requests NONE  Final   Colony Count NO GROWTH Performed at [facility]   Final   Culture NO GROWTH Performed at [facility]   Final   Report Status 06/12/2014 FINAL  Final  CSF culture     Status: None   Collection Time: 06/10/14 11:03 AM  Result Value Ref Range Status   Specimen Description CSF  Final   Special Requests Normal  Final   Gram Stain   Final    WBC PRESENT, PREDOMINANTLY MONONUCLEAR NO ORGANISMS SEEN CYTOSPIN Performed at [name] Performed at [place]   Final    NO GROWTH 3 DAYS Performed at [facility]    Report Status 06/14/2014 FINAL  Final  Gram stain     Status: None   Collection Time: 06/10/14 11:03 AM  Result Value Ref Range Status   Specimen Description CSF  Final   Special Requests NONE  Final   Gram Stain   Final    CYTOSPIN SLIDE WBC PRESENT, PREDOMINANTLY MONONUCLEAR NO ORGANISMS SEEN    Report Status 06/10/2014 FINAL  Final  Herpes simplex virus culture     Status: None   Collection Time: 06/10/14 11:04 AM  Result Value Ref Range Status   Specimen Description VAGINA  Final   Special Requests NONE  Final   Culture   Final    Herpes Simplex Type 2 detected. Performed at [facility]    Report Status 06/13/2014 FINAL  Final  Culture, blood (routine x 2)     Status: None (Preliminary result)   Collection Time: 06/10/14  1:30 PM  Result Value Ref Range Status   Specimen Description BLOOD LEFT HAND  Final   Special Requests BOTTLES DRAWN AEROBIC ONLY 5CC  Final   Culture   Final           BLOOD CULTURE  RECEIVED NO GROWTH TO DATE CULTURE WILL BE HELD FOR 5 DAYS BEFORE ISSUING A FINAL NEGATIVE REPORT Performed at [facility]    Report Status PENDING  Incomplete  Culture, blood (routine x 2)     Status: None (Preliminary result)   Collection Time: 06/10/14  1:50 PM  Result Value Ref Range Status   Specimen Description BLOOD LEFT HAND  Final   Special Requests BOTTLES DRAWN AEROBIC ONLY 5 CC  Final   Culture   Final           BLOOD CULTURE RECEIVED NO GROWTH TO DATE CULTURE WILL BE HELD FOR 5 DAYS BEFORE ISSUING A FINAL NEGATIVE REPORT Performed at [facility]    Report Status PENDING  Incomplete  MRSA PCR Screening     Status: None   Collection Time: 06/10/14  6:05 PM  Result Value Ref Range Status   MRSA by PCR NEGATIVE NEGATIVE Final    Comment:        The GeneXpert MRSA Assay (FDA approved for NASAL specimens only), is one component of a comprehensive MRSA colonization surveillance program. It is not intended to diagnose MRSA infection nor to guide or monitor treatment for MRSA infections.      Studies:  Recent x-ray studies have been reviewed in detail by the Attending Physician  Scheduled Meds:  Scheduled Meds: . antiseptic oral rinse  7 mL Mouth Rinse BID  . desmopressin  10 mcg Nasal BID  . feeding supplement (RESOURCE BREEZE)  1 Container Oral Q24H  . fentaNYL  50 mcg Transdermal Q72H  . gabapentin  300 mg Oral TID  . heparin  5,000 Units Subcutaneous 3 times per day  . hydrocortisone sod succinate (SOLU-CORTEF) inj  25 mg Intravenous Q12H  . lactose free nutrition  237 mL Oral Q24H  . levETIRAcetam  1,000 mg Intravenous Q12H  . levothyroxine  100 mcg Oral QAC breakfast  . LORazepam  1 mg Intravenous 3 times per day  . mirtazapine  15 mg Oral QHS  . sodium chloride  10-40 mL Intracatheter Q12H    Time spent on care of this patient: 25 mins    RAI,RIPUDEEP M.D. [facility] 06/17/2014, 10:06 AM  Pager: [phone]

## 2014-06-17 NOTE — Progress Notes (Signed)
Patient arrived to 50N23. VSS, see assessment. Call bell within patient's reach, patient educated on importance of staff assistance during ambulation, asked to notify and wait for staff prior to ambulation. Will continue to monitor closely.

## 2014-06-18 LAB — BASIC METABOLIC PANEL
ANION GAP: 1 — AB (ref 5–15)
BUN: <5 mg/dL — ABNORMAL LOW (ref 6–23)
CALCIUM: 8.4 mg/dL (ref 8.4–10.5)
CO2: 29 mmol/L (ref 19–32)
Chloride: 109 mmol/L (ref 96–112)
Creatinine, Ser: 0.87 mg/dL (ref 0.50–1.10)
GFR calc Af Amer: 86 mL/min — ABNORMAL LOW (ref >90)
GFR calc non Af Amer: 74 mL/min — ABNORMAL LOW (ref >90)
Glucose, Bld: 86 mg/dL (ref 70–99)
POTASSIUM: 3.2 mmol/L — AB (ref 3.5–5.1)
Sodium: 139 mmol/L (ref 135–145)

## 2014-06-18 MED ORDER — POTASSIUM CHLORIDE 20 MEQ/15ML (10%) PO SOLN
40.0000 meq | Freq: Every day | ORAL | Status: DC
  Filled 2014-06-18: qty 30

## 2014-06-18 MED ORDER — POTASSIUM CHLORIDE 10 MEQ/100ML IV SOLN
10.0000 meq | INTRAVENOUS | Status: AC
  Administered 2014-06-18 (×4): 10 meq via INTRAVENOUS
  Filled 2014-06-18 (×4): qty 100

## 2014-06-18 MED ORDER — PANTOPRAZOLE SODIUM 40 MG IV SOLR
40.0000 mg | Freq: Two times a day (BID) | INTRAVENOUS | Status: DC
  Administered 2014-06-18 (×2): 40 mg via INTRAVENOUS
  Filled 2014-06-18 (×2): qty 40

## 2014-06-18 MED ORDER — METOCLOPRAMIDE HCL 5 MG/ML IJ SOLN
10.0000 mg | Freq: Three times a day (TID) | INTRAMUSCULAR | Status: DC
  Administered 2014-06-18 – 2014-06-22 (×12): 10 mg via INTRAVENOUS
  Filled 2014-06-18 (×12): qty 2

## 2014-06-18 MED FILL — Desmopressin Acetate Nasal Spray Soln 0.01%: NASAL | Qty: 5 | Status: AC

## 2014-06-18 NOTE — Progress Notes (Signed)
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  Apparently since 06/06/14 patient had been having intermittent fevers.  She started having tingling and numbness of her bilateral upper extremities, then persistent nausea with vomiting. She stated she had not been able to keep anything down. Patient was on the phone with her daughter when she began to have sx c/w her usual seizure activity. When the daughter arrived at the pt's place, patient was lying on a couch and was very lethargic.  They got into the car, and drove her from [facility] to [name]. Upon arrival to the emergency room, patient was noted to be anxious, rolling around and shaking. Although, there were no obvious seizure-like movements, because of a history of fever, headache and possible worsening seizures, patient underwent a lumbar puncture.   Assessment/Plan:  Nausea/vomiting/abdominal pain: unclear etiology, possibly worsening of gastroparesis - LFTs normal, patient had MRI abdomen 11/15 showed 2.7 centimeter hemorrhagic left renal sinus cyst otherwise no abdominal pathology, upper quadrant ultrasound in 11/15 had shown minimal sludge in gallbladder otherwise normal - CT abdomen and pelvis w/o contrast (patient has contrast allergy) which showed possible gastroparesis - nuclear medicine gastric empty study showed mild gastroparesis - Continue full liquid diet, GI placed on Reglan and PPI  Seizures v/s pseudoseizures - Neurology was consulted, patient underwent MRI of the brain which did not show any mass or CVA  - EEG was normal, no new recommendations from neurology  - Patient was seen by psychiatry, Dr. Kathie Dike on 2/9 and recommended continue current medication management without any  changes and monitor for drug seeking behavior especially opiates and benzodiazepine. 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   Hypokalemia -Placed on daily replacement  SIRS; no clear etiology - UA negative for UTI, chest x-ray negative for pneumonia, LP/CSF without evidence of meningitis/encephalitis, influenza panel negative -  CSF HSV negative   Acute renal failure prerenal azotemia given history of recurrent nausea and vomiting -  creatinine  peaked at 1.44 - Currently stable    Narcotic seeking behavior - Previous records reviewed, will avoid narcotics as it can exacerbate and worsen gastroparesis    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 not taking her hydrocortisone at home, continue IV hydrocortisone until taking adequate oral diet and no nausea or vomiting   DI Continue with DDAVP - Na and volume status currently normal   Adjustment D/O As per Psychiatry   Code Status: FULL  Family Communication: called patient's daughter, Ronny Bacon, left detailed  voice mail message  Disposition Plan:   Consultants: Psychiatry  Neuro Gastroenterology  Procedures: EEG - 2/8 - normal EEG   Antibiotics: Acyclovir 2/7 > 2/9  DVT prophylaxis: SQ heparin   Subjective  Patient seen and examined, continues to complain of abdominal pain and nausea, unable to hold anything down  Objective: Blood pressure 127/68, pulse 70, temperature 98.7 F (37.1 C), temperature source Oral, resp. rate 20, height 5\' 5"  (1.651 m), weight 72.576 kg (160 lb), SpO2 100 %.  Intake/Output Summary (Last 24 hours) at 06/18/14 1251 Last data filed at 06/18/14 1059  Gross per 24 hour  Intake    500 ml  Output  0 ml  Net    500 ml   Exam: General:A xO  3, NAD Lungs: CTAB Cardiovascular: RRR, normal S1 and S2 Abdomen: "diffuse tenderness", ND, NBS Extremities: No c/c/e b /l  Data  Reviewed: Basic Metabolic Panel:  Recent Labs Lab 06/14/14 0835 06/15/14 0528 06/16/14 0255 06/17/14 0410 06/18/14 0425  NA 142 142 139 142 139  K 2.9* 4.8 3.4* 3.2* 3.2*  CL 108 113* 108 111 109  CO2 22 18* 25 24 29   GLUCOSE 137* 77 94 86 86  BUN <5* <5* <5* <5* <5*  CREATININE 1.01 0.99 0.79 0.90 0.87  CALCIUM 8.4 8.8 8.5 8.8 8.4    Liver Function Tests:  Recent Labs Lab 06/14/14 0835 06/15/14 0528  AST 20 20  ALT 7 6  ALKPHOS 56 50  BILITOT 0.4 0.7  PROT 5.8* 5.3*  ALBUMIN 3.4* 3.2*    Recent Labs Lab 06/14/14 0835  LIPASE 69*   No results for input(s): AMMONIA in the last 168 hours.  CBC:  Recent Labs Lab 06/12/14 0130 06/14/14 0835  WBC 8.8 5.6  HGB 11.3* 11.6*  HCT 34.0* 34.0*  MCV 79.4 77.6*  PLT 195 197   CBG: No results for input(s): GLUCAP in the last 168 hours.  Recent Results (from the past 240 hour(s))  Respiratory virus panel (routine influenza)     Status: None   Collection Time: 06/10/14  8:31 AM  Result Value Ref Range Status   Source - RVPAN NASOPHARYNGEAL  Corrected   Respiratory Syncytial Virus A Negative Negative Final   Respiratory Syncytial Virus B Negative Negative Final   Influenza A Negative Negative Final   Influenza B Negative Negative Final   Parainfluenza 1 Negative Negative Final   Parainfluenza 2 Negative Negative Final   Parainfluenza 3 Negative Negative Final   Metapneumovirus Negative Negative Final   Rhinovirus Comment Negative Final    Comment: (NOTE) We are UNABLE to reliably determine a result for the specimen due to the presence of PCR inhibitor(s) in the specimen submitted.  If clinically indicated, please recollect an additional specimen for testing.    Adenovirus Negative Negative Final    Comment: (NOTE) Performed At: [facility] [place], [place] [id number] Lindon Romp MD VZ:[id number]   Urine culture     Status: None   Collection Time: 06/10/14  9:26 AM  Result  Value Ref Range Status   Specimen Description URINE, CATHETERIZED  Final   Special Requests NONE  Final   Colony Count NO GROWTH Performed at [facility]   Final   Culture NO GROWTH Performed at [facility]   Final   Report Status 06/12/2014 FINAL  Final  CSF culture     Status: None   Collection Time: 06/10/14 11:03 AM  Result Value Ref Range Status   Specimen Description CSF  Final   Special Requests Normal  Final   Gram Stain   Final    WBC PRESENT, PREDOMINANTLY MONONUCLEAR NO ORGANISMS SEEN CYTOSPIN Performed at [facility] Performed at [facility]    Culture   Final    NO GROWTH 3 DAYS Performed at [facility]    Report Status 06/14/2014 FINAL  Final  Gram stain     Status: None   Collection Time: 06/10/14 11:03 AM  Result Value Ref Range Status   Specimen Description CSF  Final   Special Requests NONE  Final   Gram Stain   Final  CYTOSPIN SLIDE WBC PRESENT, PREDOMINANTLY MONONUCLEAR NO ORGANISMS SEEN    Report Status 06/10/2014 FINAL  Final  Herpes simplex virus culture     Status: None   Collection Time: 06/10/14 11:04 AM  Result Value Ref Range Status   Specimen Description VAGINA  Final   Special Requests NONE  Final   Culture   Final    Herpes Simplex Type 2 detected. Performed at [facility]    Report Status 06/13/2014 FINAL  Final  Culture, blood (routine x 2)     Status: None   Collection Time: 06/10/14  1:30 PM  Result Value Ref Range Status   Specimen Description BLOOD LEFT HAND  Final   Special Requests BOTTLES DRAWN AEROBIC ONLY 5CC  Final   Culture   Final    NO GROWTH 5 DAYS Performed at [facility]    Report Status 06/17/2014 FINAL  Final  Culture, blood (routine x 2)     Status: None   Collection Time: 06/10/14  1:50 PM  Result Value Ref Range Status   Specimen Description BLOOD LEFT HAND  Final   Special Requests BOTTLES DRAWN AEROBIC ONLY 5 CC  Final   Culture   Final     NO GROWTH 5 DAYS Performed at [facility]    Report Status 06/17/2014 FINAL  Final  MRSA PCR Screening     Status: None   Collection Time: 06/10/14  6:05 PM  Result Value Ref Range Status   MRSA by PCR NEGATIVE NEGATIVE Final    Comment:        The GeneXpert MRSA Assay (FDA approved for NASAL specimens only), is one component of a comprehensive MRSA colonization surveillance program. It is not intended to diagnose MRSA infection nor to guide or monitor treatment for MRSA infections.      Studies:  Recent x-ray studies have been reviewed in detail by the Attending Physician  Scheduled Meds:  Scheduled Meds: . antiseptic oral rinse  7 mL Mouth Rinse BID  . desmopressin  10 mcg Nasal BID  . feeding supplement (RESOURCE BREEZE)  1 Container Oral Q24H  . fentaNYL  50 mcg Transdermal Q72H  . gabapentin  300 mg Oral TID  . heparin  5,000 Units Subcutaneous 3 times per day  . hydrocortisone sod succinate (SOLU-CORTEF) inj  25 mg Intravenous Q12H  . ketorolac  30 mg Intravenous Once  . lactose free nutrition  237 mL Oral Q24H  . levETIRAcetam  1,000 mg Intravenous Q12H  . levothyroxine  100 mcg Oral QAC breakfast  . LORazepam  1 mg Intravenous 3 times per day  . metoCLOPramide (REGLAN) injection  10 mg Intravenous 3 times per day  . mirtazapine  15 mg Oral QHS  . pantoprazole (PROTONIX) IV  40 mg Intravenous Q12H  . sodium chloride  10-40 mL Intracatheter Q12H    Time spent on care of this patient: 25 mins    Devorah Givhan M.D. [facility] 06/18/2014, 12:51 PM  Pager: [phone]

## 2014-06-18 NOTE — Progress Notes (Addendum)
Physical Therapy Treatment Patient Details Name: Shelly Le MRN: [id number] DOB: 07-02-1960 Today's Date: 06/18/2014    History of Present Illness pt presents with Seizures vs Psuedoseizures with hx of Pituitary Adenoma REsection, Seizures, and Narcotic seeking behaviors.      PT Comments    Patient progressing slowly with mobility. Improved ambulation distance today with encouragement and distraction. Pt with impaired muscular endurance as noted by partial knee buckling towards end of ambulation resulting in seated rest break. Fatigues easily. Required use of RW for stability. Will continue to follow acutely.   Follow Up Recommendations  Home health PT;Supervision/Assistance - 24 hour     Equipment Recommendations  None recommended by PT    Recommendations for Other Services       Precautions / Restrictions Precautions Precautions: Fall Restrictions Weight Bearing Restrictions: No    Mobility  Bed Mobility Overal bed mobility: Modified Independent             General bed mobility comments: pt needs increased time, but no physical A or cueing needed.    Transfers Overall transfer level: Needs assistance Equipment used: None Transfers: Stand Pivot Transfers Sit to Stand: Min guard Stand pivot transfers: Min guard       General transfer comment: pt demos good use of UEs.  Min guard for safety. SPT bed to/from [facility].  Ambulation/Gait Ambulation/Gait assistance: Min assist Ambulation Distance (Feet): 100 Feet Assistive device: Rolling walker (2 wheeled) Gait Pattern/deviations: Step-through pattern;Decreased stride length   Gait velocity interpretation: Below normal speed for age/gender General Gait Details: Pt with mild sway during mobility. No LOB or seizure-like activity during ambulation. Close chair follow and use of RW. Partial knee buckling towards end of ambulation distance.    Stairs            Wheelchair Mobility    Modified Rankin  (Stroke Patients Only)       Balance Overall balance assessment: Needs assistance Sitting-balance support: Feet supported;No upper extremity supported Sitting balance-Leahy Scale: Good Sitting balance - Comments: Able to donn/doff socks sitting EOB without difficulty.    Standing balance support: During functional activity Standing balance-Leahy Scale: Fair Standing balance comment: Able to maintain static standing w/out UE support however requires UE support during gait.                     Cognition Arousal/Alertness: Lethargic Behavior During Therapy: WFL for tasks assessed/performed Overall Cognitive Status: History of cognitive impairments - at baseline                      Exercises      General Comments        Pertinent Vitals/Pain Pain Assessment: Faces Faces Pain Scale: Hurts whole lot Pain Location: "all over" Pain Descriptors / Indicators: Sore;Aching Pain Intervention(s): Monitored during session;Repositioned    Home Living                      Prior Function            PT Goals (current goals can now be found in the care plan section) Progress towards PT goals: Progressing toward goals    Frequency  Min 3X/week    PT Plan Current plan remains appropriate    Co-evaluation             End of Session Equipment Utilized During Treatment: Gait belt Activity Tolerance: Patient tolerated treatment well Patient left: in bed;with call bell/phone  within reach;with bed alarm set     Time: 7989-2119 PT Time Calculation (min) (ACUTE ONLY): 30 min  Charges:  $Gait Training: 8-22 mins $Therapeutic Activity: 8-22 mins                    G CodesCandy Sledge A 07/06/2014, 4:55 PM Candy Sledge, [place], DPT [phone]

## 2014-06-18 NOTE — Progress Notes (Signed)
[facility] PROGRESS NOTE Subjective patient is still having significant nausea but no abdominal pain. States that she can be due to vomiting. She has been to [facility] been evaluated in the past for gastroparesis. She states that she has been treated with Reglan in the past but did not have any particular side effects with this medication.  Objective: Vital signs in last 24 hours: Temp:  [97.8 F (36.6 C)-99.1 F (37.3 C)] 98.7 F (37.1 C) (02/15 0951) Pulse Rate:  [60-100] 70 (02/15 0951) Resp:  [16-21] 20 (02/15 0951) BP: (80-127)/(45-75) 127/68 mmHg (02/15 0951) SpO2:  [98 %-100 %] 100 % (02/15 0951) Last BM Date: 06/17/14  Intake/Output from previous day: 02/14 0701 - 02/15 0700 In: 940 [P.O.:240; I.V.:600; IV Piggyback:100] Out: 300 [Urine:300] Intake/Output this shift:    PE: General--withdrawn and tearful bird alert and oriented  Abdomen-- soft and nontender  Lab Results: No results for input(s): WBC, HGB, HCT, PLT in the last 72 hours. BMET  Recent Labs  06/16/14 0255 06/17/14 0410 06/18/14 0425  NA 139 142 139  K 3.4* 3.2* 3.2*  CL 108 111 109  CO2 25 24 29   CREATININE 0.79 0.90 0.87   LFT No results for input(s): PROT, AST, ALT, ALKPHOS, BILITOT, BILIDIR, IBILI in the last 72 hours. PT/INR No results for input(s): LABPROT, INR in the last 72 hours. PANCREAS No results for input(s): LIPASE in the last 72 hours.       Studies/Results: No results found.  Medications: I have reviewed the patient's current medications.  Assessment/Plan: 1. Gastroparesis. This is a long-term problem for this patient. CT scan shows the stomach to be mildly distended, gastric emptying scan shows delayed emptying with 84% emptying it 4 hours which is moderately delayed. The patient has been treated in the past with metoclopramide which was tolerated and I think we ought to give the trial of that would probably give the trial of PPI as well to see if any of  that helps.   Foy Mungia JR,Serrena Linderman L 06/18/2014, 11:08 AM

## 2014-06-19 ENCOUNTER — Encounter (HOSPITAL_COMMUNITY): Payer: Self-pay | Admitting: General Practice

## 2014-06-19 ENCOUNTER — Inpatient Hospital Stay (HOSPITAL_COMMUNITY): Payer: Medicare Other

## 2014-06-19 MED ORDER — PANTOPRAZOLE SODIUM 40 MG PO TBEC
40.0000 mg | DELAYED_RELEASE_TABLET | Freq: Two times a day (BID) | ORAL | Status: DC
  Administered 2014-06-19 – 2014-06-24 (×11): 40 mg via ORAL
  Filled 2014-06-19 (×11): qty 1

## 2014-06-19 MED ORDER — LEVETIRACETAM 500 MG PO TABS
1000.0000 mg | ORAL_TABLET | Freq: Two times a day (BID) | ORAL | Status: DC
  Administered 2014-06-19 – 2014-06-24 (×11): 1000 mg via ORAL
  Filled 2014-06-19 (×11): qty 2

## 2014-06-19 MED ORDER — PROMETHAZINE HCL 25 MG/ML IJ SOLN: 12.5000 mg | INTRAMUSCULAR | Status: DC

## 2014-06-19 MED ORDER — PROMETHAZINE HCL 25 MG/ML IJ SOLN
12.5000 mg | INTRAMUSCULAR | Status: AC
  Administered 2014-06-19 – 2014-06-21 (×11): 12.5 mg via INTRAVENOUS
  Filled 2014-06-19 (×12): qty 1

## 2014-06-19 MED ORDER — PROCHLORPERAZINE EDISYLATE 5 MG/ML IJ SOLN
10.0000 mg | Freq: Four times a day (QID) | INTRAMUSCULAR | Status: DC | PRN
  Administered 2014-06-21: 10 mg via INTRAVENOUS
  Filled 2014-06-19 (×2): qty 2

## 2014-06-19 NOTE — Progress Notes (Signed)
[facility] PROGRESS NOTE Subjective patient continues to complain of nausea and episodes of vomiting. She's also having vague right-sided pain. She had 2 episodes last night. She's very frustrated because she does not feel anyone understands her diseases. She has a history of Addison's disease, diabetes insipidus, previous craniotomy for removal of brain tumor with subsequent seizure disorder.  Objective: Vital signs in last 24 hours: Temp:  [98 F (36.7 C)-99.7 F (37.6 C)] 99.7 F (37.6 C) (02/16 1001) Pulse Rate:  [68-92] 88 (02/16 1001) Resp:  [18-20] 18 (02/16 1001) BP: (87-116)/(47-66) 116/66 mmHg (02/16 1001) SpO2:  [97 %-100 %] 100 % (02/16 1001) Last BM Date: 06/17/14  Intake/Output from previous day: 02/15 0701 - 02/16 0700 In: 410 [I.V.:410] Out: -  Intake/Output this shift:    PE: patient anxious and upset but in no distress General--  Lungs--clear Abdomen-- none distended with good bowel sounds. Mild tenderness in the right upper quadrant.  Lab Results: No results for input(s): WBC, HGB, HCT, PLT in the last 72 hours. BMET  Recent Labs  06/17/14 0410 06/18/14 0425  NA 142 139  K 3.2* 3.2*  CL 111 109  CO2 24 29  CREATININE 0.90 0.87   LFT No results for input(s): PROT, AST, ALT, ALKPHOS, BILITOT, BILIDIR, IBILI in the last 72 hours. PT/INR No results for input(s): LABPROT, INR in the last 72 hours. PANCREAS No results for input(s): LIPASE in the last 72 hours.       Studies/Results: No results found.  Medications: I have reviewed the patient's current medications.  Assessment/Plan: 1. Nausea and vomiting/fake abdominal pain. Patient does have some element of gastroparesis but this is fairly minimal. We have placed our metoclopramide but this does not seem to be helping. Previous imaging has shown sludge in the gallbladder on both ultrasound and MRI. Apparently the pain is somewhat of a new issue I would wonder if her gallbladder  may be the problem. We therefore will repeat ultrasound and possibly obtain hepatobiliary scan.   Senie Lanese JR,Leldon Steege L 06/19/2014, 11:56 AM

## 2014-06-19 NOTE — Progress Notes (Signed)
Patient states that she fells that she is not getting the care she needs and would like to be transferred to [facility] were she says she has an endocrinologist that treats her condition. Patient has this information in her phone she says and will discuss this option with the attending during rounds tomorrow. Will continue to monitor patient.

## 2014-06-19 NOTE — Progress Notes (Signed)
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  Apparently since 06/06/14 patient had been having intermittent fevers.  She started having tingling and numbness of her bilateral upper extremities, then persistent nausea with vomiting. She stated she had not been able to keep anything down. Patient was on the phone with her daughter when she began to have sx c/w her usual seizure activity. When the daughter arrived at the pt's place, patient was lying on a couch and was very lethargic.  They got into the car, and drove her from [facility] [place] to [facility]. Upon arrival to the emergency room, patient was noted to be anxious, rolling around and shaking. Although, there were no obvious seizure-like movements, because of a history of fever, headache and possible worsening seizures, patient underwent a lumbar puncture.   Assessment/Plan:  Nausea/vomiting/abdominal pain: unclear etiology, possibly worsening of gastroparesis, no significant improvement - LFTs normal, patient had MRI abdomen 11/15 showed 2.7 centimeter hemorrhagic left renal sinus cyst otherwise no abdominal pathology, upper quadrant ultrasound in 11/15 had shown minimal sludge in gallbladder otherwise normal - CT abdomen and pelvis w/o contrast (patient has contrast allergy) which showed possible gastroparesis - nuclear medicine gastric empty study showed mild gastroparesis - Continue full liquid diet, GI placed on Reglan ( no improvement per patient) and PPI - Placed on scheduled Phenergan today, Compazine as needed, GI recommending right upper quadrant ultrasound and HIDA scan   Seizures v/s pseudoseizures - Neurology was consulted, patient underwent MRI of the brain which did not show any mass or CVA   - EEG was normal, no new recommendations from neurology  - Patient was seen by psychiatry, Dr. Kathie Dike on 2/9 and recommended continue current medication management without any changes and monitor for drug seeking behavior especially opiates and benzodiazepine. 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   Hypokalemia -Placed on daily replacement, follow BMET  SIRS; no clear etiology - UA negative for UTI, chest x-ray negative for pneumonia, LP/CSF without evidence of meningitis/encephalitis, influenza panel negative -  CSF HSV negative   Acute renal failure prerenal azotemia given history of recurrent nausea and vomiting -  creatinine  peaked at 1.44 - Currently stable    Narcotic seeking behavior - Previous records reviewed, will avoid narcotics as it can exacerbate and worsen gastroparesis    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 not taking her hydrocortisone at home, continue IV hydrocortisone until taking adequate oral diet and no nausea or vomiting   DI Continue with DDAVP - Na and volume status currently normal   Adjustment D/O As per Psychiatry   Code Status: FULL  Family Communication: called patient's daughter, Ronny Bacon, left detailed  voice mail message yesterday, discussed with patient in detail  Disposition Plan:   Consultants: Psychiatry  Neuro Gastroenterology  Procedures: EEG - 2/8 - normal EEG   Antibiotics: Acyclovir 2/7 > 2/9  DVT prophylaxis: SQ heparin   Subjective  Continues to complain of abdominal pain with nausea, no fevers or chills or any diarrhea  Objective: Blood pressure 95/57, pulse 88, temperature 98.8 F (37.1 C), temperature source Oral, resp. rate 18, height 5\' 5"  (1.651 m), weight 72.576 kg (160 lb), SpO2 99 %.  Intake/Output Summary (Last 24 hours) at 06/19/14 1429 Last data filed at 06/18/14 2119  Gross per 24 hour   Intake     10 ml  Output      0 ml  Net     10 ml   Exam: General:A xO  3, NAD Lungs: CTAB Cardiovascular: RRR, normal S1 and S2 Abdomen: "diffuse tenderness", ND, NBS Extremities: No c/c/e b /l  Data Reviewed: Basic Metabolic Panel:  Recent Labs Lab 06/14/14 0835 06/15/14 0528 06/16/14 0255 06/17/14 0410 06/18/14 0425  NA 142 142 139 142 139  K 2.9* 4.8 3.4* 3.2* 3.2*  CL 108 113* 108 111 109  CO2 22 18* 25 24 29   GLUCOSE 137* 77 94 86 86  BUN <5* <5* <5* <5* <5*  CREATININE 1.01 0.99 0.79 0.90 0.87  CALCIUM 8.4 8.8 8.5 8.8 8.4    Liver Function Tests:  Recent Labs Lab 06/14/14 0835 06/15/14 0528  AST 20 20  ALT 7 6  ALKPHOS 56 50  BILITOT 0.4 0.7  PROT 5.8* 5.3*  ALBUMIN 3.4* 3.2*    Recent Labs Lab 06/14/14 0835  LIPASE 69*   No results for input(s): AMMONIA in the last 168 hours.  CBC:  Recent Labs Lab 06/14/14 0835  WBC 5.6  HGB 11.6*  HCT 34.0*  MCV 77.6*  PLT 197   CBG: No results for input(s): GLUCAP in the last 168 hours.  Recent Results (from the past 240 hour(s))  Respiratory virus panel (routine influenza)     Status: None   Collection Time: 06/10/14  8:31 AM  Result Value Ref Range Status   Source - RVPAN NASOPHARYNGEAL  Corrected   Respiratory Syncytial Virus A Negative Negative Final   Respiratory Syncytial Virus B Negative Negative Final   Influenza A Negative Negative Final   Influenza B Negative Negative Final   Parainfluenza 1 Negative Negative Final   Parainfluenza 2 Negative Negative Final   Parainfluenza 3 Negative Negative Final   Metapneumovirus Negative Negative Final   Rhinovirus Comment Negative Final    Comment: (NOTE) We are UNABLE to reliably determine a result for the specimen due to the presence of PCR inhibitor(s) in the specimen submitted.  If clinically indicated, please recollect an additional specimen for testing.    Adenovirus Negative Negative Final    Comment: (NOTE) Performed At: [name] [place], [place] [id number] Lindon Romp MD OE:[id number]   Urine culture     Status: None   Collection Time: 06/10/14  9:26 AM  Result Value Ref Range Status   Specimen Description URINE, CATHETERIZED  Final   Special Requests NONE  Final   Colony Count NO GROWTH Performed at [facility]   Final   Culture NO GROWTH Performed at [facility]   Final   Report Status 06/12/2014 FINAL  Final  CSF culture     Status: None   Collection Time: 06/10/14 11:03 AM  Result Value Ref Range Status   Specimen Description CSF  Final   Special Requests Normal  Final   Gram Stain   Final    WBC PRESENT, PREDOMINANTLY MONONUCLEAR NO ORGANISMS SEEN CYTOSPIN Performed at [facility] Performed at [facility]    Culture   Final    NO GROWTH 3 DAYS Performed at [facility]    Report Status 06/14/2014 FINAL  Final  Gram stain     Status: None   Collection Time: 06/10/14 11:03 AM  Result Value Ref Range Status   Specimen Description CSF  Final   Special Requests NONE  Final   Gram Stain   Final    CYTOSPIN SLIDE WBC PRESENT, PREDOMINANTLY MONONUCLEAR NO ORGANISMS SEEN    Report Status 06/10/2014 FINAL  Final  Herpes simplex virus culture     Status: None   Collection Time: 06/10/14 11:04 AM  Result Value Ref Range Status   Specimen Description VAGINA  Final   Special Requests NONE  Final   Culture   Final    Herpes Simplex Type 2 detected. Performed at [facility]    Report Status 06/13/2014 FINAL  Final  Culture, blood (routine x 2)     Status: None   Collection Time: 06/10/14  1:30 PM  Result Value Ref Range Status   Specimen Description BLOOD LEFT HAND  Final   Special Requests BOTTLES DRAWN AEROBIC ONLY 5CC  Final   Culture   Final    NO GROWTH 5 DAYS Performed at [facility]    Report Status 06/17/2014 FINAL  Final  Culture, blood (routine x 2)     Status: None   Collection  Time: 06/10/14  1:50 PM  Result Value Ref Range Status   Specimen Description BLOOD LEFT HAND  Final   Special Requests BOTTLES DRAWN AEROBIC ONLY 5 CC  Final   Culture   Final    NO GROWTH 5 DAYS Performed at [facility]    Report Status 06/17/2014 FINAL  Final  MRSA PCR Screening     Status: None   Collection Time: 06/10/14  6:05 PM  Result Value Ref Range Status   MRSA by PCR NEGATIVE NEGATIVE Final    Comment:        The GeneXpert MRSA Assay (FDA approved for NASAL specimens only), is one component of a comprehensive MRSA colonization surveillance program. It is not intended to diagnose MRSA infection nor to guide or monitor treatment for MRSA infections.      Studies:  Recent x-ray studies have been reviewed in detail by the Attending Physician  Scheduled Meds:  Scheduled Meds: . antiseptic oral rinse  7 mL Mouth Rinse BID  . desmopressin  10 mcg Nasal BID  . feeding supplement (RESOURCE BREEZE)  1 Container Oral Q24H  . fentaNYL  50 mcg Transdermal Q72H  . gabapentin  300 mg Oral TID  . heparin  5,000 Units Subcutaneous 3 times per day  . hydrocortisone sod succinate (SOLU-CORTEF) inj  25 mg Intravenous Q12H  . ketorolac  30 mg Intravenous Once  . lactose free nutrition  237 mL Oral Q24H  . levETIRAcetam  1,000 mg Oral BID  . levothyroxine  100 mcg Oral QAC breakfast  . LORazepam  1 mg Intravenous 3 times per day  . metoCLOPramide (REGLAN) injection  10 mg Intravenous 3 times per day  . mirtazapine  15 mg Oral QHS  . pantoprazole  40 mg Oral BID  . promethazine  12.5 mg Intravenous Q4H  . sodium chloride  10-40 mL Intracatheter Q12H    Time spent on care of this patient: 25 mins    Magaby Rumberger M.D. [facility] 06/19/2014, 2:29 PM  Pager: [phone]

## 2014-06-19 NOTE — Progress Notes (Signed)
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  Pt reports ongoing nausea and inability to tolerate food. She states that Boost nutritional supplements cause nausea and vomiting. She states that chocolate ice cream makes her sick too however, she had a cup of chocolate ice cream at bedside, 100% consumed, and she was able to keep it down.   Weight remains stable.   Labs: low potassium    Height: Ht Readings from Last 1 Encounters:  06/14/14 5\' 5"  (1.651 m)    Weight Status:   Wt Readings from Last 1 Encounters:  06/14/14 160 lb (72.576 kg)  06/10/14 160 lb  Re-estimated needs:  Kcal: 1600-1800 Protein: 80-95 g protein Fluid: >/= 1.6 L/day  Skin: intact  Diet Order: Diet full liquid   Intake/Output Summary (Last 24 hours) at 06/19/14 1417 Last data filed at 06/18/14 2119  Gross per 24 hour  Intake     10 ml  Output      0 ml  Net     10 ml    Last BM: 2/14   Labs:   Recent Labs Lab 06/16/14 0255 06/17/14 0410 06/18/14 0425  NA 139 142 139  K 3.4* 3.2* 3.2*  CL 108 111 109  CO2 25 24 29   BUN <5* <5* <5*  CREATININE 0.79 0.90 0.87  CALCIUM 8.5 8.8 8.4  GLUCOSE 94 86 86    CBG (last 3)  No results for input(s): GLUCAP in the last 72 hours.  Scheduled Meds: . antiseptic oral rinse  7 mL Mouth Rinse BID  . desmopressin  10 mcg Nasal BID  . feeding supplement (RESOURCE BREEZE)  1 Container Oral  Q24H  . fentaNYL  50 mcg Transdermal Q72H  . gabapentin  300 mg Oral TID  . heparin  5,000 Units Subcutaneous 3 times per day  . hydrocortisone sod succinate (SOLU-CORTEF) inj  25 mg Intravenous Q12H  . ketorolac  30 mg Intravenous Once  . lactose free nutrition  237 mL Oral Q24H  . levETIRAcetam  1,000 mg Oral BID  . levothyroxine  100 mcg Oral QAC breakfast  . LORazepam  1 mg Intravenous 3 times per day  . metoCLOPramide (REGLAN) injection  10 mg Intravenous 3 times per day  . mirtazapine  15 mg Oral QHS  . pantoprazole  40 mg Oral BID  . promethazine  12.5 mg Intravenous Q4H  . sodium chloride  10-40 mL Intracatheter Q12H    Continuous Infusions: . sodium chloride 1,000 mL (06/19/14 0430)    Pryor Ochoa RD, LDN Inpatient Clinical Dietitian Pager: [phone] After Hours Pager: [phone]

## 2014-06-20 LAB — HEPATIC FUNCTION PANEL
ALBUMIN: 3.2 g/dL — AB (ref 3.5–5.2)
ALT: 14 U/L (ref 0–35)
AST: 31 U/L (ref 0–37)
Alkaline Phosphatase: 65 U/L (ref 39–117)
Bilirubin, Direct: <0.1 mg/dL (ref 0.0–0.5)
Total Bilirubin: 0.4 mg/dL (ref 0.3–1.2)
Total Protein: 5.4 g/dL — ABNORMAL LOW (ref 6.0–8.3)

## 2014-06-20 LAB — BASIC METABOLIC PANEL
Anion gap: 6 (ref 5–15)
BUN: <5 mg/dL — ABNORMAL LOW (ref 6–23)
CO2: 28 mmol/L (ref 19–32)
CREATININE: 1.01 mg/dL (ref 0.50–1.10)
Calcium: 8.6 mg/dL (ref 8.4–10.5)
Chloride: 107 mmol/L (ref 96–112)
GFR calc Af Amer: 72 mL/min — ABNORMAL LOW (ref >90)
GFR calc non Af Amer: 62 mL/min — ABNORMAL LOW (ref >90)
GLUCOSE: 95 mg/dL (ref 70–99)
Potassium: 3.2 mmol/L — ABNORMAL LOW (ref 3.5–5.1)
Sodium: 141 mmol/L (ref 135–145)

## 2014-06-20 LAB — LACTIC ACID, PLASMA: LACTIC ACID, VENOUS: 1.7 mmol/L (ref 0.5–2.0)

## 2014-06-20 MED ORDER — HYOSCYAMINE SULFATE 0.125 MG SL SUBL
0.2500 mg | SUBLINGUAL_TABLET | Freq: Once | SUBLINGUAL | Status: AC
  Administered 2014-06-21: 0.25 mg via SUBLINGUAL
  Filled 2014-06-20: qty 2

## 2014-06-20 MED ORDER — POTASSIUM CHLORIDE CRYS ER 20 MEQ PO TBCR
40.0000 meq | EXTENDED_RELEASE_TABLET | Freq: Once | ORAL | Status: DC
  Filled 2014-06-20 (×2): qty 2

## 2014-06-20 MED ORDER — HYDROCORTISONE NA SUCCINATE PF 100 MG IJ SOLR
50.0000 mg | Freq: Three times a day (TID) | INTRAMUSCULAR | Status: DC
  Administered 2014-06-20 – 2014-06-22 (×5): 50 mg via INTRAVENOUS
  Filled 2014-06-20 (×7): qty 2

## 2014-06-20 NOTE — Progress Notes (Signed)
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  Apparently since 06/06/14 patient had been having intermittent fevers.  She started having tingling and numbness of her bilateral upper extremities, then persistent nausea with vomiting. She stated she had not been able to keep anything down. Patient was on the phone with her daughter when she began to have sx c/w her usual seizure activity. When the daughter arrived at the pt's place, patient was lying on a couch and was very lethargic.  They got into the car, and drove her from [name] to [facility]. Upon arrival to the emergency room, patient was noted to be anxious, rolling around and shaking. Although, there were no obvious seizure-like movements, because of a history of fever, headache and possible worsening seizures, patient underwent a lumbar puncture.   Assessment/Plan:  Nausea/vomiting/abdominal pain: unclear etiology, possibly worsening of gastroparesis, no significant improvement - LFTs normal, patient had MRI abdomen 11/15 showed 2.7 centimeter hemorrhagic left renal sinus cyst otherwise no abdominal pathology, upper quadrant ultrasound in 11/15 had shown minimal sludge in gallbladder otherwise normal - CT abdomen and pelvis w/o contrast (patient has contrast allergy) which showed possible gastroparesis - nuclear medicine gastric empty study showed mild gastroparesis - Continue full liquid diet, GI placed on Reglan ( no improvement per patient) and PPI - Placed on scheduled Phenergan , Compazine as needed, GI recommending right upper quadrant ultrasound. -[place] negative for cholelithiasis.  -Will follow GI recommendation.   Seizures v/s pseudoseizures - Neurology was consulted, patient underwent MRI  of the brain which did not show any mass or CVA  - EEG was normal, no new recommendations from neurology  - Patient was seen by psychiatry, Dr. Kathie Dike on 2/9 and recommended continue current medication management without any changes and monitor for drug seeking behavior especially opiates and benzodiazepine. 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  follow BMET  SIRS; no clear etiology - UA negative for UTI, chest x-ray negative for pneumonia, LP/CSF without evidence of meningitis/encephalitis, influenza panel negative -  CSF HSV negative   Hypotension;  Suspect secondary to medication,. Vs adrenal insufficiency.  Iv Bolus.  Lactic acid ordered.  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   DI Continue with DDAVP - Na and volume status currently normal   Adjustment D/O As per Psychiatry   Code Status: FULL  Family Communication: care discussed with patient.  Disposition Plan:   Consultants: Psychiatry  Neuro Gastroenterology  Procedures: EEG - 2/8 - normal EEG   Antibiotics: Acyclovir 2/7 > 2/9  DVT prophylaxis: SQ heparin   Subjective Relates loose stool.  Still with abdominal pain.   Objective: Blood pressure 120/77, pulse 92, temperature 98.3 F (36.8  C), temperature source Oral, resp. rate 20, height 5\' 5"  (1.651 m), weight 72.576 kg (160 lb), SpO2 100  %.  Intake/Output Summary (Last 24 hours) at 06/20/14 1732 Last data filed at 06/20/14 0606  Gross per 24 hour  Intake   1160 ml  Output      0 ml  Net   1160 ml   Exam: General:A xO  3, NAD Lungs: CTAB Cardiovascular: RRR, normal S1 and S2 Abdomen: "diffuse tenderness", ND, NBS Extremities: No c/c/e b /l  Data Reviewed: Basic Metabolic Panel:  Recent Labs Lab 06/15/14 0528 06/16/14 0255 06/17/14 0410 06/18/14 0425 06/20/14 0620  NA 142 139 142 139 141  K 4.8 3.4* 3.2* 3.2* 3.2*  CL 113* 108 111 109 107  CO2 18* 25 24 29 28   GLUCOSE 77 94 86 86 95  BUN <5* <5* <5* <5* <5*  CREATININE 0.99 0.79 0.90 0.87 1.01  CALCIUM 8.8 8.5 8.8 8.4 8.6    Liver Function Tests:  Recent Labs Lab 06/14/14 0835 06/15/14 0528 06/20/14 0620  AST 20 20 31   ALT 7 6 14   ALKPHOS 56 50 65  BILITOT 0.4 0.7 0.4  PROT 5.8* 5.3* 5.4*  ALBUMIN 3.4* 3.2* 3.2*    Recent Labs Lab 06/14/14 0835  LIPASE 69*   No results for input(s): AMMONIA in the last 168 hours.  CBC:  Recent Labs Lab 06/14/14 0835  WBC 5.6  HGB 11.6*  HCT 34.0*  MCV 77.6*  PLT 197   CBG: No results for input(s): GLUCAP in the last 168 hours.  Recent Results (from the past 240 hour(s))  MRSA PCR Screening     Status: None   Collection Time: 06/10/14  6:05 PM  Result Value Ref Range Status   MRSA by PCR NEGATIVE NEGATIVE Final    Comment:        The GeneXpert MRSA Assay (FDA approved for NASAL specimens only), is one component of a comprehensive MRSA colonization surveillance program. It is not intended to diagnose MRSA infection nor to guide or monitor treatment for MRSA infections.      Studies:  Recent x-ray studies have been reviewed in detail by the Attending Physician  Scheduled Meds:  Scheduled Meds: . antiseptic oral rinse  7 mL Mouth Rinse BID  . desmopressin  10 mcg Nasal BID  . feeding supplement (RESOURCE BREEZE)  1 Container Oral Q24H  . fentaNYL  50 mcg Transdermal Q72H    . gabapentin  300 mg Oral TID  . heparin  5,000 Units Subcutaneous 3 times per day  . hydrocortisone sod succinate (SOLU-CORTEF) inj  50 mg Intravenous Q8H  . ketorolac  30 mg Intravenous Once  . lactose free nutrition  237 mL Oral Q24H  . levETIRAcetam  1,000 mg Oral BID  . levothyroxine  100 mcg Oral QAC breakfast  . LORazepam  1 mg Intravenous 3 times per day  . metoCLOPramide (REGLAN) injection  10 mg Intravenous 3 times per day  . mirtazapine  15 mg Oral QHS  . pantoprazole  40 mg Oral BID  . promethazine  12.5 mg Intravenous Q4H  . sodium chloride  10-40 mL Intracatheter Q12H    Time spent on care of this patient: 25 mins    Niel Hummer A M.D. [facility] 06/20/2014, 5:32 PM  Pager: [phone]

## 2014-06-20 NOTE — Progress Notes (Signed)
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      PT Comments    Patient progressing slowly with mobility. Requires frequent encouragement and coaxing to participate. Improved ambulation distance today however fatigues quickly. Able to increase activity level by use of distractions. Continues to be upset about care provided by staff in hospital. Will continue to follow acutely.   Follow Up Recommendations  Home health PT;Supervision/Assistance - 24 hour     Equipment Recommendations  None recommended by PT    Recommendations for Other Services       Precautions / Restrictions Precautions Precautions: Fall Restrictions Weight Bearing Restrictions: No    Mobility  Bed Mobility Overal bed mobility: Modified Independent             General bed mobility comments: pt needs increased time, but no physical A or cueing needed.    Transfers Overall transfer level: Needs assistance Equipment used: Rolling walker (2 wheeled) Transfers: Sit to/from Stand Sit to Stand: Min guard         General transfer comment: pt demos good use of UEs.  Min guard for safety. Stood from [employer], from chair x1, from toilet x1.  Ambulation/Gait Ambulation/Gait assistance: Min guard Ambulation Distance (Feet): 150 Feet Assistive device: Rolling walker (2 wheeled) Gait Pattern/deviations: Step-through pattern;Decreased stride length   Gait velocity interpretation: Below normal speed for age/gender General Gait Details: Pt with mild sway during mobility. No LOB or seizure-like activity during ambulation. Close chair follow and use of RW.  Dyspnea present. pt does well with distraction.   Stairs            Wheelchair Mobility    Modified Rankin (Stroke  Patients Only)       Balance Overall balance assessment: Needs assistance Sitting-balance support: Feet supported;No upper extremity supported Sitting balance-Leahy Scale: Good Sitting balance - Comments: Able to perform pericare reaching outside BoS without difficulty and donn socks sitting EOB.   Standing balance support: During functional activity Standing balance-Leahy Scale: Fair Standing balance comment: ABle to perform dynamic standing going through suit case and packing clothes wtithout UE support. Requires UE support for ambulation.                    Cognition Arousal/Alertness: Awake/alert Behavior During Therapy: WFL for tasks assessed/performed Overall Cognitive Status: History of cognitive impairments - at baseline                      Exercises      General Comments General comments (skin integrity, edema, etc.): Pt very upset about level of care provided at hospital.      Pertinent Vitals/Pain Pain Assessment: Faces Faces Pain Scale: Hurts whole lot Pain Location: "everywhere" Pain Descriptors / Indicators: Sore;Aching Pain Intervention(s): Monitored during session;Repositioned    Home Living                      Prior Function            PT Goals (current goals can now be found in the care plan section) Progress towards PT goals: Progressing toward goals    Frequency  Min 3X/week    PT Plan Current plan remains appropriate    Co-evaluation  End of Session Equipment Utilized During Treatment: Gait belt Activity Tolerance: Patient tolerated treatment well Patient left: in bed;with bed alarm set;with call bell/phone within reach     Time: 1420-1454 PT Time Calculation (min) (ACUTE ONLY): 34 min  Charges:  $Gait Training: 8-22 mins $Therapeutic Activity: 8-22 mins                    G CodesCandy Sledge A 2014-07-16, 3:17 PM Candy Sledge, [place], DPT [phone]

## 2014-06-20 NOTE — Progress Notes (Signed)
[facility] PROGRESS NOTE Subjective Pt  Notes that she is feeling a little better, Is hungry.  [place] is OK no GSs.  Objective: Vital signs in last 24 hours: Temp:  [98 F (36.7 C)-98.9 F (37.2 C)] 98.3 F (36.8 C) (02/17 1704) Pulse Rate:  [60-113] 92 (02/17 1704) Resp:  [17-20] 20 (02/17 1704) BP: (76-121)/(44-82) 120/77 mmHg (02/17 1704) SpO2:  [95 %-100 %] 100 % (02/17 1704) Last BM Date: 06/17/14  Intake/Output from previous day: 02/16 0701 - 02/17 0700 In: 1160 [P.O.:1150; I.V.:10] Out: -  Intake/Output this shift:    PE:  Abdomen--minimal tender  Lab Results: No results for input(s): WBC, HGB, HCT, PLT in the last 72 hours. BMET  Recent Labs  06/18/14 0425 06/20/14 0620  NA 139 141  K 3.2* 3.2*  CL 109 107  CO2 29 28  CREATININE 0.87 1.01   LFT  Recent Labs  06/20/14 0620  PROT 5.4*  AST 31  ALT 14  ALKPHOS 65  BILITOT 0.4  BILIDIR <0.1  IBILI NOT CALCULATED   PT/INR No results for input(s): LABPROT, INR in the last 72 hours. PANCREAS No results for input(s): LIPASE in the last 72 hours.       Studies/Results: US Abdomen Complete  06/19/2014   CLINICAL DATA:  Patient a nausea and vomiting.  Abdominal back pain.  EXAM: ULTRASOUND ABDOMEN COMPLETE  COMPARISON:  MRI abdomen 03/11/2014  FINDINGS: Gallbladder: No gallstones or wall thickening visualized. No sonographic Murphy sign noted.  Common bile duct: Diameter: 3.8 mm  Liver: Diffusely increased in echogenicity, compatible with steatosis.  IVC: No abnormality visualized.  Pancreas: Poorly visualized.  Spleen: Size and appearance within normal limits.  Right Kidney: Length: 10.6 cm. Echogenicity within normal limits. No mass or hydronephrosis visualized.  Left Kidney: Length: 10.2 cm. No hydronephrosis. There is a 2.0 cm cyst within the lower pole.  Abdominal aorta: No aneurysm visualized.  Other findings: None.  IMPRESSION: No cholelithiasis or sonographic evidence for acute  cholecystitis.  No hydronephrosis.   Electronically Signed   By: Lovey Newcomer M.D.   On: 06/19/2014 20:48    Medications: I have reviewed the patient's current medications.  Assessment/Plan: 1. N+V ? Cause. Will advance diet and will consider EGD if doesn't tolerate.   Maressa Apollo JR,Lejend Dalby L 06/20/2014, 6:14 PM

## 2014-06-21 ENCOUNTER — Encounter (HOSPITAL_COMMUNITY): Payer: Self-pay | Admitting: *Deleted

## 2014-06-21 ENCOUNTER — Encounter (HOSPITAL_COMMUNITY)
Admission: EM | Disposition: A | Discharge status: Home Health | Payer: Self-pay | Source: Home / Self Care | Attending: Internal Medicine

## 2014-06-21 HISTORY — PX: ESOPHAGOGASTRODUODENOSCOPY: SHX5428

## 2014-06-21 LAB — BASIC METABOLIC PANEL
Anion gap: 5 (ref 5–15)
CALCIUM: 8.4 mg/dL (ref 8.4–10.5)
CHLORIDE: 109 mmol/L (ref 96–112)
CO2: 25 mmol/L (ref 19–32)
Creatinine, Ser: 0.93 mg/dL (ref 0.50–1.10)
GFR calc non Af Amer: 68 mL/min — ABNORMAL LOW (ref >90)
GFR, EST AFRICAN AMERICAN: 79 mL/min — AB (ref >90)
Glucose, Bld: 120 mg/dL — ABNORMAL HIGH (ref 70–99)
Potassium: 3.9 mmol/L (ref 3.5–5.1)
Sodium: 139 mmol/L (ref 135–145)

## 2014-06-21 LAB — CBC
HEMATOCRIT: 27.7 % — AB (ref 36.0–46.0)
HEMOGLOBIN: 9.2 g/dL — AB (ref 12.0–15.0)
MCH: 27.3 pg (ref 26.0–34.0)
MCHC: 33.2 g/dL (ref 30.0–36.0)
MCV: 82.2 fL (ref 78.0–100.0)
Platelets: 180 10*3/uL (ref 150–400)
RBC: 3.37 MIL/uL — ABNORMAL LOW (ref 3.87–5.11)
RDW: 14.3 % (ref 11.5–15.5)
WBC: 8.1 10*3/uL (ref 4.0–10.5)

## 2014-06-21 SURGERY — EGD (ESOPHAGOGASTRODUODENOSCOPY)
Anesthesia: Moderate Sedation

## 2014-06-21 MED ORDER — DIPHENHYDRAMINE HCL 50 MG/ML IJ SOLN
INTRAMUSCULAR | Status: AC
  Filled 2014-06-21: qty 1

## 2014-06-21 MED ORDER — PROMETHAZINE HCL 25 MG/ML IJ SOLN
6.2500 mg | Freq: Four times a day (QID) | INTRAMUSCULAR | Status: DC | PRN
  Administered 2014-06-22 (×2): 6.25 mg via INTRAVENOUS
  Filled 2014-06-21 (×2): qty 1

## 2014-06-21 MED ORDER — MIDAZOLAM HCL 5 MG/ML IJ SOLN
INTRAMUSCULAR | Status: AC
  Filled 2014-06-21: qty 2

## 2014-06-21 MED ORDER — LEVOTHYROXINE SODIUM 100 MCG PO TABS
100.0000 ug | ORAL_TABLET | Freq: Every day | ORAL | Status: DC
  Administered 2014-06-22 – 2014-06-24 (×3): 100 ug via ORAL
  Filled 2014-06-21 (×3): qty 1

## 2014-06-21 MED ORDER — FENTANYL CITRATE 0.05 MG/ML IJ SOLN
INTRAMUSCULAR | Status: AC
  Filled 2014-06-21: qty 2

## 2014-06-21 MED ORDER — KETOROLAC TROMETHAMINE 15 MG/ML IJ SOLN
15.0000 mg | Freq: Once | INTRAMUSCULAR | Status: AC
  Administered 2014-06-22: 15 mg via INTRAVENOUS
  Filled 2014-06-21: qty 1

## 2014-06-21 MED ORDER — MIDAZOLAM HCL 10 MG/2ML IJ SOLN
INTRAMUSCULAR | Status: DC | PRN
  Administered 2014-06-21 (×5): 2 mg via INTRAVENOUS

## 2014-06-21 MED ORDER — SUCRALFATE 1 GM/10ML PO SUSP
1.0000 g | Freq: Three times a day (TID) | ORAL | Status: DC
  Administered 2014-06-21 – 2014-06-22 (×2): 1 g via ORAL
  Filled 2014-06-21 (×6): qty 10

## 2014-06-21 MED ORDER — BUTAMBEN-TETRACAINE-BENZOCAINE 2-2-14 % EX AERO
INHALATION_SPRAY | CUTANEOUS | Status: DC | PRN
  Administered 2014-06-21: 2 via TOPICAL

## 2014-06-21 MED ORDER — FENTANYL CITRATE 0.05 MG/ML IJ SOLN
INTRAMUSCULAR | Status: DC | PRN
  Administered 2014-06-21 (×4): 25 ug via INTRAVENOUS

## 2014-06-21 MED ORDER — DIPHENHYDRAMINE HCL 50 MG/ML IJ SOLN
INTRAMUSCULAR | Status: DC | PRN
  Administered 2014-06-21 (×2): 25 mg via INTRAVENOUS

## 2014-06-21 MED ORDER — TRAMADOL HCL 50 MG PO TABS
50.0000 mg | ORAL_TABLET | Freq: Three times a day (TID) | ORAL | Status: DC | PRN
  Administered 2014-06-21: 50 mg via ORAL
  Filled 2014-06-21 (×2): qty 1

## 2014-06-21 NOTE — Op Note (Signed)
[facility] [place], [id number]   ENDOSCOPY PROCEDURE REPORT  PATIENT: Shelly Le, Shelly Le  MR#: [id number] BIRTHDATE: February 10, 1961 , 49  yrs. old GENDER: female ENDOSCOPIST:Joseluis Alessio Oletta Lamas, MD REFERRED BY: [facility] PROCEDURE DATE:  07-14-14 PROCEDURE:   EGD w/ biopsy ASA CLASS:    Class III INDICATIONS: persistent nausea and vague abdominal pain. MEDICATION: Fentanyl 100 mcg IV, Benadryl 50 mg IV, and Versed 10 mg IV TOPICAL ANESTHETIC:   Cetacaine Spray  DESCRIPTION OF PROCEDURE:   After the risks and benefits of the procedure were explained, informed consent was obtained.  The Pentax Gastroscope [id number]  endoscope was introduced through the mouth  and advanced to the second portion of the duodenum .  The instrument was slowly withdrawn as the mucosa was fully examined.      ESOPHAGUS: Diverticulum distal esophagus approximately 1 cm.  STOMACH: Scar tissue from previous peg site.   Two medium sized non-bleeding and shallow ulcers were found on the lesser curvature of the stomach.   Multiple biopsies were performed.  DUODENUM: The duodenal mucosa showed no abnormalities. Retroflexed views revealed no abnormalities.    The scope was then withdrawn from the patient and the procedure completed.  COMPLICATIONS: There were no immediate complications.  ENDOSCOPIC IMPRESSION: 1.   Diverticulum distal esophagus approximately 1 cm 2.   Scar tissue from previous peg site 3.   Two medium sized ulcers were found on the lesser curvature of the stomach 4.   Multiple biopsies were performed 5.   The duodenal mucosa showed no abnormalities RECOMMENDATIONS: Will continue to treat the ulcerations with PPI and will add Carafate   _______________________________ eSignedLaurence Spates, MD 07-14-14 4:34 PM     cc:  CPT CODES: ICD CODES:  The ICD and CPT codes recommended by this software are interpretations from the data that  the clinical staff has captured with the software.  The verification of the translation of this report to the ICD and CPT codes and modifiers is the sole responsibility of the health care institution and practicing physician where this report was generated.  [employer] will not be held responsible for the validity of the ICD and CPT codes included on this report.  AMA assumes no liability for data contained or not contained herein. CPT is a Designer, television/film set of the [employer].  PATIENT NAME:  [name], [name] MR#: [id number]

## 2014-06-21 NOTE — H&P (View-Only) (Signed)
Subjective:   HPI  The patient is a 54 year old female who we are asked to see in consultation in regards to generalized abdominal pain as well as nausea and vomiting which has been going on for about a week according to her. The pain is generalized and comes and goes. She states that she has a history of gastroparesis and has seen Dr. Derrill Kay at [facility] in the past. She has not seen him however since 2013. She states that back then she was having abdominal pain and nausea and vomiting. She apparently had tried Reglan but then said one of her doctors thought Reglan might be causing her a problem and therefore she was put on a medicine that she had to get from [place] which I suspect was domperidone, since this is a medicine that is used for gastroparesis but is not available in the [place]. At the present time in any event she does not appear in any distress and is not having any pain that I can tell in talking to her.  Review of Systems No chest pain or shortness of breath  Past Medical History  Diagnosis Date  . Addison's disease   . Diabetes insipidus   . Pneumonia   . Seizures   . Brain tumor    Past Surgical History  Procedure Laterality Date  . Tonsillectomy    . Abdominal hysterectomy    . Brain surgery      crainotomy   History   Social History  . Marital Status: Divorced    Spouse Name: N/A  . Number of Children: N/A  . Years of Education: N/A   Occupational History  . Not on file.   Social History Main Topics  . Smoking status: Never Smoker   . Smokeless tobacco: Never Used  . Alcohol Use: No  . Drug Use: No  . Sexual Activity: Not on file   Other Topics Concern  . Not on file   Social History Narrative   family history is not on file.  Current facility-administered medications:  .  0.9 %  sodium chloride infusion, , Intravenous, Continuous, Cherene Altes, MD, Last Rate: 50 mL/hr at 06/13/14 1826 .  acetaminophen (TYLENOL)  tablet 650 mg, 650 mg, Oral, Q6H PRN **OR** acetaminophen (TYLENOL) suppository 650 mg, 650 mg, Rectal, Q6H PRN, Jonetta Osgood, MD .  albuterol (PROVENTIL) (2.5 MG/3ML) 0.083% nebulizer solution 2.5 mg, 2.5 mg, Nebulization, Q2H PRN, Jonetta Osgood, MD .  desmopressin (DDAVP) 0.01 % nasal solution 10 mcg, 10 mcg, Nasal, BID, Jonetta Osgood, MD, 10 mcg at 06/14/14 1205 .  [START ON 06/15/2014] feeding supplement (RESOURCE BREEZE) (RESOURCE BREEZE) liquid 1 Container, 1 Container, Oral, Q24H, Reanne J Barnett, RD .  fentaNYL (DURAGESIC - dosed mcg/hr) patch 50 mcg, 50 mcg, Transdermal, Q72H, Cherene Altes, MD, 50 mcg at 06/13/14 1145 .  gabapentin (NEURONTIN) capsule 300 mg, 300 mg, Oral, TID, Jonetta Osgood, MD, 300 mg at 06/14/14 1051 .  guaiFENesin-dextromethorphan (ROBITUSSIN DM) 100-10 MG/5ML syrup 5 mL, 5 mL, Oral, Q4H PRN, Jonetta Osgood, MD .  heparin injection 5,000 Units, 5,000 Units, Subcutaneous, 3 times per day, Jonetta Osgood, MD, 5,000 Units at 06/14/14 0550 .  hydrocortisone sodium succinate (SOLU-CORTEF) 100 MG injection 25 mg, 25 mg, Intravenous, Q12H, Cherene Altes, MD, 25 mg at 06/14/14 0551 .  lactose free nutrition (BOOST PLUS) liquid 237 mL, 237 mL, Oral, Q24H, Reanne Maryland Pink, RD .  levETIRAcetam (KEPPRA)  IVPB 1000 mg/100 mL premix, 1,000 mg, Intravenous, Q12H, Cherene Altes, MD, 1,000 mg at 06/14/14 1051 .  levothyroxine (SYNTHROID, LEVOTHROID) tablet 100 mcg, 100 mcg, Oral, QAC breakfast, Eudelia Bunch, RPH, 100 mcg at 06/14/14 0815 .  LORazepam (ATIVAN) injection 1 mg, 1 mg, Intravenous, 3 times per day, Cherene Altes, MD, 1 mg at 06/14/14 0557 .  methocarbamol (ROBAXIN) tablet 500 mg, 500 mg, Oral, Q8H PRN, Cherene Altes, MD, 500 mg at 06/13/14 2041 .  mirtazapine (REMERON) tablet 15 mg, 15 mg, Oral, QHS, Jonetta Osgood, MD, 15 mg at 06/13/14 2204 .  polyvinyl alcohol (LIQUIFILM TEARS) 1.4 % ophthalmic solution 1 drop, 1 drop, Both  Eyes, PRN, Jonetta Osgood, MD .  potassium chloride 10 mEq in 100 mL IVPB, 10 mEq, Intravenous, Q1 Hr x 4, Ripudeep K Rai, MD, 10 mEq at 06/14/14 1233 .  potassium chloride SA (K-DUR,KLOR-CON) CR tablet 40 mEq, 40 mEq, Oral, Q4H, Ripudeep K Rai, MD, 40 mEq at 06/14/14 1141 .  promethazine (PHENERGAN) injection 12.5 mg, 12.5 mg, Intravenous, Q6H PRN, Geradine Girt, DO, 12.5 mg at 06/14/14 1121 Allergies  Allergen Reactions  . Contrast Media [Iodinated Diagnostic Agents] Shortness Of Breath and Rash  . Fentanyl Other (See Comments)    Witnessed grand tonic clonic seizure in ED while wearing patch (02/26/14); h/o seizure disorder (pt states she still uses patches as needed for pain)  . Morphine Hives and Shortness Of Breath  . Shellfish Allergy Anaphylaxis and Hives  . Penicillins Nausea And Vomiting    GI Intolerance  . Latex Rash  . Metrizamide Rash     Objective:     BP 127/60 mmHg  Pulse 85  Temp(Src) 99.9 F (37.7 C) (Oral)  Resp 16  Ht 5\' 5"  (1.651 m)  Wt 72.576 kg (160 lb)  BMI 26.63 kg/m2  SpO2 99%  She is in no distress  Nonicteric  Heart regular rhythm no murmurs  Lungs clear  Abdomen: Bowel sounds normal, soft, not distended, minimal generalized discomfort but no rebound or guarding  Laboratory No components found for: D1    Assessment:     Abdominal pain with associated nausea and vomiting. Etiology unclear  History of gastroparesis      Plan:     A CT scan of the abdomen and pelvis has been ordered. It's possible that she has a worsening of her gastroparesis again creating her nausea and vomiting. It may be reasonable to do a gastric emptying study in addition to the CT scan.

## 2014-06-21 NOTE — Interval H&P Note (Signed)
History and Physical Interval Note:  06/21/2014 3:49 PM  Shelly Le  has presented today for surgery, with the diagnosis of abd pain  The various methods of treatment have been discussed with the patient and family. After consideration of risks, benefits and other options for treatment, the patient has consented to  Procedure(s): ESOPHAGOGASTRODUODENOSCOPY (EGD) (N/A) as a surgical intervention .  The patient's history has been reviewed, patient examined, no change in status, stable for surgery.  I have reviewed the patient's chart and labs.  Questions were answered to the patient's satisfaction.     Bryttani Blew JR,Davell Beckstead L

## 2014-06-21 NOTE — Progress Notes (Signed)
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  Apparently since 06/06/14 patient had been having intermittent fevers.  She started having tingling and numbness of her bilateral upper extremities, then persistent nausea with vomiting. She stated she had not been able to keep anything down. Patient was on the phone with her daughter when she began to have sx c/w her usual seizure activity. When the daughter arrived at the pt's place, patient was lying on a couch and was very lethargic.  They got into the car, and drove her from [name] to [facility]. Upon arrival to the emergency room, patient was noted to be anxious, rolling around and shaking. Although, there were no obvious seizure-like movements, because of a history of fever, headache and possible worsening seizures, patient underwent a lumbar puncture.   Assessment/Plan:  Nausea/vomiting/abdominal pain: unclear etiology, possibly worsening of gastroparesis, no significant improvement - LFTs normal, patient had MRI abdomen 11/15 showed 2.7 centimeter hemorrhagic left renal sinus cyst otherwise no abdominal pathology, upper quadrant ultrasound in 11/15 had shown minimal sludge in gallbladder otherwise normal - CT abdomen and pelvis w/o contrast (patient has contrast allergy) which showed possible gastroparesis - nuclear medicine gastric empty study showed mild gastroparesis - Continue full liquid diet, GI placed on Reglan ( no improvement per patient) and PPI - Placed on scheduled Phenergan , Compazine as needed, GI recommending right upper quadrant ultrasound. -[place] negative for cholelithiasis.  -for endoscopy. Still with pain. Will add tramadol PRN.  -endoscopy with 2 ulcer stomach. Carafate started.    Seizures v/s pseudoseizures - Neurology was consulted, patient underwent MRI of the brain which did not show any mass or CVA  - EEG was normal, no new recommendations from neurology  - Patient was seen by psychiatry, Dr. Kathie Dike on 2/9 and recommended continue current medication management without any changes and monitor for drug seeking behavior especially opiates and benzodiazepine. 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  follow BMET  SIRS; no clear etiology - UA negative for UTI, chest x-ray negative for pneumonia, LP/CSF without evidence of meningitis/encephalitis, influenza panel negative -  CSF HSV negative   Hypotension;  BP stable.  Suspect secondary to medication,. Vs adrenal insufficiency.  Iv Bolus.  Lactic acid normal.  Increased hydrocortisone to 50 mg TID>   Anemia; follow trend.   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   DI Continue with DDAVP - Na and volume status currently normal   Adjustment D/O As per Psychiatry   Code Status: FULL  Family Communication: care discussed with patient.  Disposition Plan:   Consultants: Psychiatry  Neuro Gastroenterology  Procedures: EEG - 2/8 - normal EEG   Antibiotics: Acyclovir 2/7 > 2/9  DVT prophylaxis: SQ heparin  Subjective Still with abdominal pain, asking for IV dilaudid.    Objective: Blood pressure 114/76, pulse 80,  temperature 98.4 F (36.9 C), temperature source Oral, resp. rate 13, height 5\' 5"  (1.651 m), weight 72.576 kg (160 lb), SpO2 97 %.  Intake/Output Summary (Last 24 hours) at 06/21/14 1701 Last data filed at 06/21/14 0511  Gross per 24 hour  Intake    600 ml  Output      0 ml  Net    600 ml   Exam: General:A xO  3, NAD Lungs: CTAB Cardiovascular: RRR, normal S1 and S2 Abdomen: "diffuse tenderness", ND, NBS Extremities: No c/c/e b /l  Data Reviewed: Basic Metabolic Panel:  Recent Labs Lab 06/16/14 0255 06/17/14 0410 06/18/14 0425 06/20/14 0620 06/21/14 0500  NA 139 142 139 141 139  K 3.4* 3.2* 3.2* 3.2* 3.9  CL 108 111 109 107 109  CO2 25 24 29 28 25   GLUCOSE 94 86 86 95 120*  BUN <5* <5* <5* <5* <5*  CREATININE 0.79 0.90 0.87 1.01 0.93  CALCIUM 8.5 8.8 8.4 8.6 8.4    Liver Function Tests:  Recent Labs Lab 06/15/14 0528 06/20/14 0620  AST 20 31  ALT 6 14  ALKPHOS 50 65  BILITOT 0.7 0.4  PROT 5.3* 5.4*  ALBUMIN 3.2* 3.2*   No results for input(s): LIPASE, AMYLASE in the last 168 hours. No results for input(s): AMMONIA in the last 168 hours.  CBC:  Recent Labs Lab 06/21/14 0500  WBC 8.1  HGB 9.2*  HCT 27.7*  MCV 82.2  PLT 180   CBG: No results for input(s): GLUCAP in the last 168 hours.  No results found for this or any previous visit (from the past 240 hour(s)).   Studies:  Recent x-ray studies have been reviewed in detail by the Attending Physician  Scheduled Meds:  Scheduled Meds: . antiseptic oral rinse  7 mL Mouth Rinse BID  . desmopressin  10 mcg Nasal BID  . feeding supplement (RESOURCE BREEZE)  1 Container Oral Q24H  . fentaNYL  50 mcg Transdermal Q72H  . gabapentin  300 mg Oral TID  . heparin  5,000 Units Subcutaneous 3 times per day  . hydrocortisone sod succinate (SOLU-CORTEF) inj  50 mg Intravenous Q8H  . ketorolac  30 mg Intravenous Once  . lactose free nutrition  237 mL Oral Q24H  . levETIRAcetam  1,000 mg Oral BID  .  levothyroxine  100 mcg Oral QAC breakfast  . LORazepam  1 mg Intravenous 3 times per day  . metoCLOPramide (REGLAN) injection  10 mg Intravenous 3 times per day  . mirtazapine  15 mg Oral QHS  . pantoprazole  40 mg Oral BID  . potassium chloride  40 mEq Oral Once  . sodium chloride  10-40 mL Intracatheter Q12H    Time spent on care of this patient: 25 mins    Niel Hummer A M.D. [facility] 06/21/2014, 5:01 PM  Pager: [phone]

## 2014-06-21 NOTE — Progress Notes (Signed)
Patient still having upper abdominal pain and some nausea. Complaining of multiple panes all over and concluding in her joints steroids apparently increased. Abdomen relatively nontender this morning. We will go ahead with EGD this p.m. Have discussed this with the patient and she is agreeable will allow ice chips up until around 9 o'clock.

## 2014-06-22 ENCOUNTER — Encounter (HOSPITAL_COMMUNITY): Payer: Self-pay | Admitting: Gastroenterology

## 2014-06-22 LAB — BASIC METABOLIC PANEL
Anion gap: 6 (ref 5–15)
CO2: 26 mmol/L (ref 19–32)
CREATININE: 1.08 mg/dL (ref 0.50–1.10)
Calcium: 8.1 mg/dL — ABNORMAL LOW (ref 8.4–10.5)
Chloride: 109 mmol/L (ref 96–112)
GFR calc non Af Amer: 57 mL/min — ABNORMAL LOW (ref >90)
GFR, EST AFRICAN AMERICAN: 66 mL/min — AB (ref >90)
GLUCOSE: 123 mg/dL — AB (ref 70–99)
Potassium: 2.9 mmol/L — ABNORMAL LOW (ref 3.5–5.1)
SODIUM: 141 mmol/L (ref 135–145)

## 2014-06-22 LAB — CBC
HCT: 26.7 % — ABNORMAL LOW (ref 36.0–46.0)
Hemoglobin: 8.7 g/dL — ABNORMAL LOW (ref 12.0–15.0)
MCH: 27.2 pg (ref 26.0–34.0)
MCHC: 32.6 g/dL (ref 30.0–36.0)
MCV: 83.4 fL (ref 78.0–100.0)
PLATELETS: 171 10*3/uL (ref 150–400)
RBC: 3.2 MIL/uL — ABNORMAL LOW (ref 3.87–5.11)
RDW: 14.7 % (ref 11.5–15.5)
WBC: 9 10*3/uL (ref 4.0–10.5)

## 2014-06-22 MED ORDER — HYDROCORTISONE NA SUCCINATE PF 100 MG IJ SOLR
25.0000 mg | Freq: Two times a day (BID) | INTRAMUSCULAR | Status: DC
  Administered 2014-06-22 – 2014-06-23 (×2): 25 mg via INTRAVENOUS
  Filled 2014-06-22 (×2): qty 2

## 2014-06-22 MED ORDER — POTASSIUM CHLORIDE 10 MEQ/100ML IV SOLN
10.0000 meq | INTRAVENOUS | Status: AC
  Administered 2014-06-22 (×4): 10 meq via INTRAVENOUS
  Filled 2014-06-22 (×4): qty 100

## 2014-06-22 MED ORDER — KETOROLAC TROMETHAMINE 15 MG/ML IJ SOLN
15.0000 mg | Freq: Once | INTRAMUSCULAR | Status: AC
  Administered 2014-06-22: 15 mg via INTRAVENOUS
  Filled 2014-06-22: qty 1

## 2014-06-22 MED ORDER — PROMETHAZINE HCL 25 MG PO TABS
12.5000 mg | ORAL_TABLET | Freq: Four times a day (QID) | ORAL | Status: DC | PRN
  Administered 2014-06-22 – 2014-06-24 (×5): 12.5 mg via ORAL
  Filled 2014-06-22 (×5): qty 1

## 2014-06-22 MED ORDER — PROCHLORPERAZINE MALEATE 5 MG PO TABS
5.0000 mg | ORAL_TABLET | Freq: Four times a day (QID) | ORAL | Status: DC | PRN
  Filled 2014-06-22: qty 1

## 2014-06-22 MED ORDER — SUCRALFATE 1 G PO TABS
1.0000 g | ORAL_TABLET | Freq: Three times a day (TID) | ORAL | Status: DC
  Administered 2014-06-22 – 2014-06-24 (×10): 1 g via ORAL
  Filled 2014-06-22 (×10): qty 1

## 2014-06-22 MED ORDER — POTASSIUM CHLORIDE CRYS ER 20 MEQ PO TBCR
40.0000 meq | EXTENDED_RELEASE_TABLET | Freq: Once | ORAL | Status: DC
  Filled 2014-06-22: qty 2

## 2014-06-22 MED ORDER — HYDROCODONE-ACETAMINOPHEN 5-325 MG PO TABS
1.0000 | ORAL_TABLET | Freq: Four times a day (QID) | ORAL | Status: DC | PRN
  Administered 2014-06-22 – 2014-06-23 (×6): 1 via ORAL
  Filled 2014-06-22 (×6): qty 1

## 2014-06-22 MED ORDER — ALPRAZOLAM 0.5 MG PO TABS
1.0000 mg | ORAL_TABLET | Freq: Three times a day (TID) | ORAL | Status: DC | PRN
  Administered 2014-06-22 – 2014-06-24 (×4): 1 mg via ORAL
  Filled 2014-06-22 (×5): qty 2

## 2014-06-22 NOTE — Progress Notes (Addendum)
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  Apparently since 06/06/14 patient had been having intermittent fevers.  She started having tingling and numbness of her bilateral upper extremities, then persistent nausea with vomiting. She stated she had not been able to keep anything down. Patient was on the phone with her daughter when she began to have sx c/w her usual seizure activity. When the daughter arrived at the pt's place, patient was lying on a couch and was very lethargic.  They got into the car, and drove her from [facility] to [facility]. Upon arrival to the emergency room, patient was noted to be anxious, rolling around and shaking. Although, there were no obvious seizure-like movements, because of a history of fever, headache and possible worsening seizures, patient underwent a lumbar puncture.   Assessment/Plan:  Nausea/vomiting/abdominal pain: unclear etiology, possibly worsening of gastroparesis, no significant improvement - LFTs normal, patient had MRI abdomen 11/15 showed 2.7 centimeter hemorrhagic left renal sinus cyst otherwise no abdominal pathology, upper quadrant ultrasound in 11/15 had shown minimal sludge in gallbladder otherwise normal - CT abdomen and pelvis w/o contrast (patient has contrast allergy) which showed possible gastroparesis - nuclear medicine gastric empty study showed mild gastroparesis - Continue full liquid diet, GI placed on Reglan ( no improvement per patient) and PPI - Placed on scheduled Phenergan , Compazine as needed, GI recommending right upper quadrant ultrasound. -[place] negative for cholelithiasis.  -for endoscopy. Still with pain. Tramadol doesn't help. Will change it to Vicodin.  -endoscopy with 2 ulcer  stomach. Carafate started.  -repeat lipase level in am.   Seizures v/s pseudoseizures - Neurology was consulted, patient underwent MRI of the brain which did not show any mass or CVA  - EEG was normal, no new recommendations from neurology  - Patient was seen by psychiatry, Dr. Kathie Dike on 2/9 and recommended continue current medication management without any changes and monitor for drug seeking behavior especially opiates and benzodiazepine. 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   Hypokalemia -replete with 4 runs IV. And 40 meq times one.   SIRS; no clear etiology - UA negative for UTI, chest x-ray negative for pneumonia, LP/CSF without evidence of meningitis/encephalitis, influenza panel negative -  CSF HSV negative   Hypotension;  BP stable.  Suspect secondary to medication,. Vs adrenal insufficiency.  Iv Bolus.  Lactic acid normal.  Decrease hydrocortisone to 25 mg BID. Need to titrate to home dose.   Anemia; check anemia panel. Monitor hb. On protonix for ulcers.  Hb 3 moths ago was at 9.2. Hb on admission at 11.   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   DI Continue with DDAVP - Na and volume status currently normal  Generalized pain; Check ESR, Cortisol level.   Adjustment D/O  As per Psychiatry   Code Status: FULL  Family Communication: care discussed with patient.  Disposition Plan:   Consultants: Psychiatry   Neuro Gastroenterology  Procedures: EEG - 2/8 - normal EEG   Antibiotics: Acyclovir 2/7 > 2/9  DVT prophylaxis: SQ heparin   Subjective Still with abdominal pain.  Relates generalized pain, bones, head. Relates nausea. Tramadol doesn't work.  Willing to try Vicodin. She was asking for Toradol. Explain to her that I would like to avoid Toradol due to stomach ulcers.  Had a BM   Objective: Blood pressure 114/62, pulse 108, temperature 98 F (36.7 C), temperature source Oral, resp. rate 20, height 5' 5"  (1.651 m), weight 72.576 kg (160 lb), SpO2 98 %.  Intake/Output Summary (Last 24 hours) at 06/22/14 1340 Last data filed at 06/22/14 1251  Gross per 24 hour  Intake    240 ml  Output      0 ml  Net    240 ml   Exam: General:A xO  3, NAD Lungs: CTAB Cardiovascular: RRR, normal S1 and S2 Abdomen: Mild diffuse tenderness, ND, NBS Extremities: No c/c/e b /l  Data Reviewed: Basic Metabolic Panel:  Recent Labs Lab 06/17/14 0410 06/18/14 0425 06/20/14 0620 06/21/14 0500 06/22/14 0515  NA 142 139 141 139 141  K 3.2* 3.2* 3.2* 3.9 2.9*  CL 111 109 107 109 109  CO2 24 29 28 25 26   GLUCOSE 86 86 95 120* 123*  BUN <5* <5* <5* <5* <5*  CREATININE 0.90 0.87 1.01 0.93 1.08  CALCIUM 8.8 8.4 8.6 8.4 8.1*    Liver Function Tests:  Recent Labs Lab 06/20/14 0620  AST 31  ALT 14  ALKPHOS 65  BILITOT 0.4  PROT 5.4*  ALBUMIN 3.2*   No results for input(s): LIPASE, AMYLASE in the last 168 hours. No results for input(s): AMMONIA in the last 168 hours.  CBC:  Recent Labs Lab 06/21/14 0500 06/22/14 0515  WBC 8.1 9.0  HGB 9.2* 8.7*  HCT 27.7* 26.7*  MCV 82.2 83.4  PLT 180 171   CBG: No results for input(s): GLUCAP in the last 168 hours.  No results found for this or any previous visit (from the past 240 hour(s)).   Studies:  Recent x-ray studies have been reviewed in detail by the Attending Physician  Scheduled Meds:  Scheduled Meds: . antiseptic oral  rinse  7 mL Mouth Rinse BID  . desmopressin  10 mcg Nasal BID  . fentaNYL  50 mcg Transdermal Q72H  . gabapentin  300 mg Oral TID  . heparin  5,000 Units Subcutaneous 3 times per day  . hydrocortisone sod succinate (SOLU-CORTEF) inj  25 mg Intravenous Q12H  . levETIRAcetam  1,000 mg Oral BID  . levothyroxine  100 mcg Oral QAC breakfast  . mirtazapine  15 mg Oral QHS  . pantoprazole  40 mg Oral BID  . potassium chloride  40 mEq Oral Once  . sodium chloride  10-40 mL Intracatheter Q12H  . sucralfate  1 g Oral TID WC & HS    Time spent on care of this patient: 25 mins    Niel Hummer A M.D. [facility] 06/22/2014, 1:40 PM  Pager: [phone]

## 2014-06-22 NOTE — Progress Notes (Signed)
Patient called Probation officer in room and complained about services from MD. Request to have another MD. Dr. Tyrell Antonio notified via [place].

## 2014-06-22 NOTE — Progress Notes (Signed)
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      PT Comments    Patient stating she is in increased pain "everywhere" due to being back on steroids. Patient refused ambulation. Continues to required increased time for all activities. Patient continues to believe she is not getting the best care and that no one understand her. Will continue with current POC  Follow Up Recommendations  Home health PT;Supervision/Assistance - 24 hour     Equipment Recommendations  None recommended by PT    Recommendations for Other Services       Precautions / Restrictions Precautions Precautions: Fall    Mobility  Bed Mobility Overal bed mobility: Modified Independent                Transfers Overall transfer level: Needs assistance Equipment used: Rolling walker (2 wheeled)   Sit to Stand: Min assist Stand pivot transfers: Min assist       General transfer comment: pt demos good use of UEs.  Min A for balance and for safety. Stood from [employer],  from toilet x1.  Ambulation/Gait             General Gait Details: refused due to pain   Stairs            Wheelchair Mobility    Modified Rankin (Stroke Patients Only)       Balance                                    Cognition Arousal/Alertness: Awake/alert Behavior During Therapy: WFL for tasks assessed/performed Overall Cognitive Status: History of cognitive impairments - at baseline                      Exercises      General Comments        Pertinent Vitals/Pain Pain Score: 10-Worst pain ever Pain Location: "everywhere" Pain Descriptors / Indicators: Sore;Aching Pain Intervention(s): Monitored during session;Repositioned    Home Living                       Prior Function            PT Goals (current goals can now be found in the care plan section) Progress towards PT goals: Not progressing toward goals - comment    Frequency  Min 3X/week    PT Plan Current plan remains appropriate    Co-evaluation             End of Session   Activity Tolerance: Patient limited by pain Patient left: in bed;with call bell/phone within reach     Time: 3976-7341 PT Time Calculation (min) (ACUTE ONLY): 24 min  Charges:  $Therapeutic Exercise: 23-37 mins                    G Codes:      Jacqualyn Posey 06/22/2014, 1:09 PM 06/22/2014 Jacqualyn Posey PTA [phone] pager [phone] office

## 2014-06-22 NOTE — Progress Notes (Addendum)
Talked to patient with Darnelle Bos RN present about DCP; Informed patient that her Md was working on discharging her home soon / when stable and to offer [facility] choices; Patient was active with [facility] prior to admission but was unable to see her due to her readmission ( to [facility]). CM asked patient if she wanted to stay with them or go with another [facility] agency Patient was agreeable to have them again; CM also asked patient was she interested in going to a SNF if she thought that she was unable to go home; patient stated that her family will be up this afternoon to see her and she will talk to them about that. Patient was very polite without any complaints.  Later informed by patient's nurse that when I left the room, the patient rolled her eyes and stated "she does not know what she is talking about with [facility] and I want her name and she should be fired." B [employer] [phone]

## 2014-06-22 NOTE — Progress Notes (Signed)
Patient complained of headache and Tramadol offered prer prn order. Patient declined medication and stated it did not work. Tylenol also offered and patient stated medication cause her stomach upset. MD notified.

## 2014-06-22 NOTE — Progress Notes (Signed)
Writer was in room during MD's visit with patient and did not witness any issues by MD during this visit. Communication was polite and in a professional manner. MD made some changes per patient's request and patient still complained about not getting the right treatment. Will continue to monitor.

## 2014-06-22 NOTE — Progress Notes (Signed)
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  Pt reported feeling nauseous, and ate small amount of breakfast this morning. She stated the yogurt makes her stomach hurt. Daughter has been providing Strawberry Boost, and pt appears to be tolerating it well. Pt did not try Magic Cup; was willing to try it again. DI brought Magic Cup and saltines to room. D/C yogurt snacks. Weight remains stable.   Labs: low potassium, calcium; high glucose, BUN    Height: Ht Readings from Last 1 Encounters:  06/14/14 5\' 5"  (1.651 m)    Weight Status:   Wt Readings from Last 1 Encounters:  06/14/14 160 lb (72.576 kg)  06/10/14 160 lb  Re-estimated needs:  Kcal: 1600-1800 Protein: 80-95 g protein Fluid: >/= 1.6 L/day  Skin: intact  Diet Order: Diet Heart  No intake or output data in the 24 hours ending 06/22/14 1221  Last BM: 2/14   Labs:   Recent Labs Lab 06/20/14 0620 06/21/14 0500 06/22/14 0515  NA 141 139 141  K 3.2* 3.9 2.9*  CL 107 109 109  CO2 28 25 26   BUN <5* <5* <5*  CREATININE 1.01 0.93 1.08  CALCIUM 8.6 8.4 8.1*  GLUCOSE 95 120* 123*    CBG (last 3)  No results for input(s): GLUCAP in the last 72 hours.  Scheduled Meds: . antiseptic oral rinse  7 mL Mouth Rinse BID  . desmopressin  10 mcg Nasal BID  . fentaNYL  50 mcg Transdermal Q72H  . gabapentin  300 mg Oral TID  .  heparin  5,000 Units Subcutaneous 3 times per day  . hydrocortisone sod succinate (SOLU-CORTEF) inj  25 mg Intravenous Q12H  . levETIRAcetam  1,000 mg Oral BID  . levothyroxine  100 mcg Oral QAC breakfast  . mirtazapine  15 mg Oral QHS  . pantoprazole  40 mg Oral BID  . potassium chloride  10 mEq Intravenous Q1 Hr x 4  . potassium chloride  40 mEq Oral Once  . sodium chloride  10-40 mL Intracatheter Q12H  . sucralfate  1 g Oral TID WC & HS    Continuous Infusions: . sodium chloride 100 mL/hr at 06/22/14 0001    Wynona Dove, MS Dietetic Intern Pager: [phone]

## 2014-06-22 NOTE — Progress Notes (Signed)
[facility] PROGRESS NOTE Subjective patient continues to have multiple complaints. She is hurting all over feels that she is not getting better. She can eat well but does not appear to be having increasing abdominal pain. Her joints back ache despite the steroids. Patient feels she is not getting any better.  Objective: Vital signs in last 24 hours: Temp:  [98 F (36.7 C)-98.8 F (37.1 C)] 98.1 F (36.7 C) (02/19 1410) Pulse Rate:  [70-121] 95 (02/19 1410) Resp:  [13-21] 20 (02/19 1410) BP: (91-134)/(21-82) 91/49 mmHg (02/19 1410) SpO2:  [97 %-100 %] 98 % (02/19 1410) Last BM Date: 06/21/14  Intake/Output from previous day:   Intake/Output this shift: Total I/O In: 240 [P.O.:240] Out: -   PE: General--anxious and talkative in no distress  Abdomen-- nondistended soft and essentially nontender  Lab Results:  Recent Labs  06/21/14 0500 06/22/14 0515  WBC 8.1 9.0  HGB 9.2* 8.7*  HCT 27.7* 26.7*  PLT 180 171   BMET  Recent Labs  06/20/14 0620 06/21/14 0500 06/22/14 0515  NA 141 139 141  K 3.2* 3.9 2.9*  CL 107 109 109  CO2 28 25 26   CREATININE 1.01 0.93 1.08   LFT  Recent Labs  06/20/14 0620  PROT 5.4*  AST 31  ALT 14  ALKPHOS 65  BILITOT 0.4  BILIDIR <0.1  IBILI NOT CALCULATED   PT/INR No results for input(s): LABPROT, INR in the last 72 hours. PANCREAS No results for input(s): LIPASE in the last 72 hours.       Studies/Results: No results found.  Medications: I have reviewed the patient's current medications.  Assessment/Plan: 1. Nausea/anorexia/shallow gastric ulcers. Discussion with patient and her parents who were in the room. It may be that the gastric ulcers came from taking various NSAIDs which she denies contributed to by steroids. Pathology still pending. I have told them that I did not feel the gastric ulcers or the cause of all of her symptoms. 2. Weakness and diffuse pain. Patient has panned hypo-pituitary is him  secondary resection of pituitary adenoma and has Addison's disease. She apparently also carries many diagnoses including fibromyalgia, some type rheumatologic diagnosis, history of drug abuse and adjustment disorder followed by psychiatry.   Chidera Thivierge JR,Duward Allbritton L 06/22/2014, 3:47 PM

## 2014-06-22 NOTE — Progress Notes (Deleted)
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  Pt reported feeling nauseous, and ate small amount of breakfast this morning. She stated the yogurt makes her stomach hurt. Daughter has been providing Strawberry Boost, and pt appears to be tolerating it well. Pt did not try Magic Cup; was willing to try it again. DI brought Magic Cup and saltines to room. D/C yogurt snacks. Weight remains stable.   Labs: low potassium, calcium; high glucose, BUN    Height: Ht Readings from Last 1 Encounters:  06/14/14 5\' 5"  (1.651 m)    Weight Status:   Wt Readings from Last 1 Encounters:  06/14/14 160 lb (72.576 kg)  06/10/14 160 lb  Re-estimated needs:  Kcal: 1600-1800 Protein: 80-95 g protein Fluid: >/= 1.6 L/day  Skin: intact  Diet Order: Diet Heart  No intake or output data in the 24 hours ending 06/22/14 1207  Last BM: 2/14   Labs:   Recent Labs Lab 06/20/14 0620 06/21/14 0500 06/22/14 0515  NA 141 139 141  K 3.2* 3.9 2.9*  CL 107 109 109  CO2 28 25 26   BUN <5* <5* <5*  CREATININE 1.01 0.93 1.08  CALCIUM 8.6 8.4 8.1*  GLUCOSE 95 120* 123*    CBG (last 3)  No results for input(s): GLUCAP in the last 72 hours.  Scheduled Meds: . antiseptic oral rinse  7 mL Mouth Rinse BID  . desmopressin  10 mcg Nasal BID  . feeding supplement (RESOURCE BREEZE)  1 Container Oral Q24H  . fentaNYL  50 mcg Transdermal Q72H  .  gabapentin  300 mg Oral TID  . heparin  5,000 Units Subcutaneous 3 times per day  . hydrocortisone sod succinate (SOLU-CORTEF) inj  25 mg Intravenous Q12H  . lactose free nutrition  237 mL Oral Q24H  . levETIRAcetam  1,000 mg Oral BID  . levothyroxine  100 mcg Oral QAC breakfast  . mirtazapine  15 mg Oral QHS  . pantoprazole  40 mg Oral BID  . potassium chloride  10 mEq Intravenous Q1 Hr x 4  . potassium chloride  40 mEq Oral Once  . sodium chloride  10-40 mL Intracatheter Q12H  . sucralfate  1 g Oral TID WC & HS    Continuous Infusions: . sodium chloride 100 mL/hr at 06/22/14 0001    Wynona Dove, MS Dietetic Intern Pager: [phone]

## 2014-06-23 DIAGNOSIS — R112 Nausea with vomiting, unspecified: Secondary | ICD-10-CM | POA: Insufficient documentation

## 2014-06-23 DIAGNOSIS — R1110 Vomiting, unspecified: Secondary | ICD-10-CM

## 2014-06-23 LAB — IRON AND TIBC
Iron: 72 ug/dL (ref 42–145)
Saturation Ratios: 38 % (ref 20–55)
TIBC: 191 ug/dL — ABNORMAL LOW (ref 250–470)
UIBC: 119 ug/dL — ABNORMAL LOW (ref 125–400)

## 2014-06-23 LAB — RETICULOCYTES
RBC.: 3.32 MIL/uL — ABNORMAL LOW (ref 3.87–5.11)
Retic Count, Absolute: 49.8 10*3/uL (ref 19.0–186.0)
Retic Ct Pct: 1.5 % (ref 0.4–3.1)

## 2014-06-23 LAB — BASIC METABOLIC PANEL
Anion gap: 8 (ref 5–15)
BUN: 6 mg/dL (ref 6–23)
CHLORIDE: 104 mmol/L (ref 96–112)
CO2: 25 mmol/L (ref 19–32)
Calcium: 8.2 mg/dL — ABNORMAL LOW (ref 8.4–10.5)
Creatinine, Ser: 0.97 mg/dL (ref 0.50–1.10)
GFR calc Af Amer: 75 mL/min — ABNORMAL LOW (ref >90)
GFR calc non Af Amer: 65 mL/min — ABNORMAL LOW (ref >90)
GLUCOSE: 130 mg/dL — AB (ref 70–99)
Potassium: 3.2 mmol/L — ABNORMAL LOW (ref 3.5–5.1)
SODIUM: 137 mmol/L (ref 135–145)

## 2014-06-23 LAB — CBC
HCT: 27 % — ABNORMAL LOW (ref 36.0–46.0)
HEMOGLOBIN: 8.8 g/dL — AB (ref 12.0–15.0)
MCH: 26.5 pg (ref 26.0–34.0)
MCHC: 32.6 g/dL (ref 30.0–36.0)
MCV: 81.3 fL (ref 78.0–100.0)
PLATELETS: 182 10*3/uL (ref 150–400)
RBC: 3.32 MIL/uL — AB (ref 3.87–5.11)
RDW: 14.5 % (ref 11.5–15.5)
WBC: 9.4 10*3/uL (ref 4.0–10.5)

## 2014-06-23 LAB — SEDIMENTATION RATE: SED RATE: 7 mm/h (ref 0–22)

## 2014-06-23 LAB — VITAMIN B12: Vitamin B-12: 244 pg/mL (ref 211–911)

## 2014-06-23 LAB — FOLATE: FOLATE: 3.7 ng/mL

## 2014-06-23 LAB — FERRITIN: Ferritin: 47 ng/mL (ref 10–291)

## 2014-06-23 LAB — CORTISOL-AM, BLOOD: Cortisol - AM: 2.9 ug/dL — ABNORMAL LOW (ref 4.3–22.4)

## 2014-06-23 MED ORDER — POTASSIUM CHLORIDE 10 MEQ/100ML IV SOLN
10.0000 meq | INTRAVENOUS | Status: AC
  Administered 2014-06-23 (×4): 10 meq via INTRAVENOUS
  Filled 2014-06-23 (×3): qty 100

## 2014-06-23 MED ORDER — SODIUM CHLORIDE 0.9 % IV SOLN
INTRAVENOUS | Status: DC
  Administered 2014-06-23 – 2014-06-24 (×2): via INTRAVENOUS
  Filled 2014-06-23 (×4): qty 1000

## 2014-06-23 MED ORDER — HYDROCORTISONE 5 MG PO TABS
25.0000 mg | ORAL_TABLET | Freq: Two times a day (BID) | ORAL | Status: DC
  Administered 2014-06-23 – 2014-06-24 (×3): 25 mg via ORAL
  Filled 2014-06-23 (×4): qty 1

## 2014-06-23 MED ORDER — DESMOPRESSIN ACE SPRAY REFRIG 0.01 % NA SOLN
10.0000 ug | Freq: Two times a day (BID) | NASAL | Status: DC
  Administered 2014-06-23 – 2014-06-24 (×2): 1 via NASAL
  Filled 2014-06-23: qty 5

## 2014-06-23 NOTE — Progress Notes (Signed)
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 Apparently since 06/06/14 patient had been having intermittent fevers. She started having tingling and numbness of her bilateral upper extremities, then persistent nausea with vomiting. She stated she had not been able to keep anything down. Patient was on the phone with her daughter when she began to have sx c/w her usual seizure activity. When the daughter arrived at the pt's place, patient was lying on a couch and was very lethargic. They got into the car, and drove her from [facility] to [name]. Upon arrival to the emergency room, patient was noted to be anxious, rolling around and shaking. Although, there were no obvious seizure-like movements, because of a history of fever, headache and possible worsening seizures, patient underwent a lumbar puncture which did not suggest an infectious process.  Interim history  After I introduced myself, the patient initially begins to explain how her current pain and antiemetic medications are not working for her. She continues to explain that none of her other physicians have been listening to her, and she felt that they have not been treating her properly displayed her complaints of continued pain. She stated that only hydromorphone and Phenergan have worked in the past. Nursing and ancillary staff have not reported any witnessed vomiting. In fact patient has been tolerating her diet. I've explained to the patient that increasing her opioids at this time will worsen her clinical condition. I also stated that I will not change her current opioid regimen   Assessment/Plan:  Nausea/vomiting/abdominal pain/gastroparesis, narcotic bowel syndrome - LFTs  normal, patient had MRI abdomen 11/15 showed 2.7 centimeter hemorrhagic left renal sinus cyst otherwise no abdominal pathology, upper quadrant ultrasound in 11/15 had shown minimal sludge in gallbladder otherwise normal - CT abdomen and pelvis w/o contrast (patient has contrast allergy) which showed possible gastroparesis - nuclear medicine gastric empty study showed mild gastroparesis -GI placed on Reglan ( no improvement per patient) and PPI - Placed on scheduled Phenergan , Compazine as needed, GI recommending right upper quadrant ultrasound. -US abdomen negative for cholelithiasis or cholecystitis -06/21/2014 EGD (Dr. Kathi Simpers shallow ulcers on the lesser curvature of the stomach--pathology hemorrhagic gastritis with ulcer and necrosis -Continue PPI and Carafate -I have explained to the patient that I will not change any of her current dosing of opioids or anti-medics on almost there is objective evidence of clinical worsening or recurrent vomiting -Patient stated that she would be better if her diet was more palatable--> changed to regular diet  Seizures v/s pseudoseizures - Neurology was consulted, patient underwent MRI of the brain which did not show any mass or CVA  - EEG was normal, no new recommendations from neurology  - Patient was seen by psychiatry, Dr. Kathie Dike on 2/9 and recommended continue current medication management without any changes and monitor for drug seeking behavior especially opiates and benzodiazepine. 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  -secondary to medication,. Vs adrenal insufficiency.  -Blood pressure has improved and remained clinically stable over the last 48 hours  Lactic acid normal.  -Continue to wean hydrocortisone, change to by mouth formulation  Anemia;  -Drop in patient's hemoglobin is partly dilutional  -No signs of active bleeding at this time  -Baseline hemoglobin 10-11   Acute renal failure prerenal azotemia given history of recurrent nausea and vomiting - creatinine peaked at 1.44 - Currently resolved and  stable   Narcotic seeking behavior - Previous records reviewed, will avoid narcotics as it can exacerbate and worsen gastroparesis and narcotic bowel syndrome  -I have explained to the patient the risks of continued opioid use. 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 Total time 40 minutes, greater than 50% spent counseling and coordinate care Disposition Plan: home with home health in 1-2 days  Consultants: Psychiatry  Neuro Gastroenterology  Procedures: EEG - 2/8 - normal EEG   Antibiotics: Acyclovir 2/7 > 2/9  DVT prophylaxis: SQ heparin      Procedures/Studies: Ct Abdomen Pelvis Wo Contrast  06/14/2014   CLINICAL DATA:  Abdominal pain. Addison's disease. Diabetes insipidus  EXAM: CT ABDOMEN AND PELVIS  WITHOUT CONTRAST  TECHNIQUE: Multidetector CT imaging of the abdomen and pelvis was performed following the standard protocol without oral or intravenous contrast material administration.  COMPARISON:  March 07, 2014 CT abdomen and pelvis; MRI abdomen March 11, 2014  FINDINGS: There are bilateral pleural effusions with bibasilar lung atelectatic change. There is a small hiatal hernia.  No focal liver lesions are identified on this noncontrast enhanced study. There is hepatic steatosis with fatty sparing near the gallbladder fossa region. The gallbladder wall is not appreciably thickened. There is no biliary duct dilatation.  Spleen and pancreas appear normal. Adrenals are normal in size and contour bilaterally.  The previously noted mass in the anterior mid left kidney appears stable, measuring 1.7 x 1.5 cm in size. 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 There is a tiny cyst in the posterior mid right kidney. No hydronephrosis is appreciable on either side. There is a 1 mm calculus in the medial upper pole left kidney. No other renal calculi seen. No ureteral calculi are identified. There is a retroaortic left renal vein, an anatomic variant.  In the pelvis, the urinary bladder is midline with normal wall thickness. Uterus is absent. There is no pelvic mass or pelvic fluid collection. The appendix appears normal.  There are several sites of air in the anterior abdominal wall on the right, probably injection related. Small injection granulomas are again noted in comparison with the prior study.  There is no bowel obstruction. No free air or portal venous air. The stomach appears mildly distended without appreciable gastric wall thickening. There  is no appreciable ascites, adenopathy, or abscess in the abdomen or pelvis. There is no demonstrable abdominal aortic aneurysm. There are no blastic or lytic bone lesions.  IMPRESSION: No bowel obstruction. No abscess. Note that the stomach is  mildly distended with air. If gastric paresis is of clinical concern, a nuclear medicine gastric emptying study could be quite helpful for further assessment.  1 mm calculus left kidney. 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 No hydronephrosis. No ureteral calculi.  Uterus absent.  Appendix normal.   Electronically Signed   By: Lowella Grip III M.D.   On: 06/14/2014 15:28   Ct Head Wo Contrast  06/10/2014   CLINICAL DATA:  Numerous seizures since 06/09/2014. History of right frontal craniotomy.  EXAM: CT HEAD WITHOUT CONTRAST  TECHNIQUE: Contiguous axial images were obtained from the base of the skull through the vertex without intravenous contrast.  COMPARISON:  02/26/2014; 02/23/2014; brain MRI - 02/26/2014  FINDINGS: Stable sequela of right frontal craniotomy.  The gray-white differentiation is maintained. No CT evidence of acute large territory infarct. No intraparenchymal or extra-axial mass or hemorrhage. Normal size and configuration of the ventricles and basilar cisterns. No midline shift. Limited visualization of the paranasal sinuses and mastoid air cells is normal. No air-fluid levels. Regional soft tissues appear normal. No displaced calvarial fracture.  IMPRESSION: 1. No acute intracranial process. 2. Stable sequela of prior right frontal craniotomy.   Electronically Signed   By: Sandi Mariscal M.D.   On: 06/10/2014 09:03   Mr Brain Wo Contrast  06/11/2014   CLINICAL DATA:  Initial valuation for seizure, with fever. Also with receded recent herpetic outbreak. Concern for possible intracranial infection.  EXAM: MRI HEAD WITHOUT CONTRAST  TECHNIQUE: Multiplanar, multiecho pulse sequences of the brain and surrounding structures were obtained without intravenous contrast.  COMPARISON:  Prior CT from 06/10/2014 as well as previous MRI from 02/26/2014.  FINDINGS: Cerebral volume within normal limits for patient age. No significant white matter changes identified.  No focal  parenchymal signal abnormality. No evidence for infarct or hemorrhage. Gray-white matter differentiation maintained. Normal intravascular flow voids present.  Hippocampi are normal in morphology with normal signal intensity bilaterally. No signal abnormality to suggest herpes encephalitis or other active infection.  Cerebellar tonsils are minimally low lying, by within normal limits. Pituitary grossly unremarkable on this non pituitary protocol MRI. No acute abnormality seen about the orbits. Mild exophthalmos noted.  Paranasal sinuses are clear.  No mastoid effusion.  Postoperative changes from prior right frontal craniotomy. Calvarium otherwise unremarkable. Scalp soft tissues within normal limits.  IMPRESSION: 1. No acute intracranial process identified. No signal changes to suggest active intracranial infection identified. 2. Prior right frontal craniotomy.   Electronically Signed   By: Jeannine Boga M.D.   On: 06/11/2014 06:24   Nm Gastric Emptying  06/15/2014   CLINICAL DATA:  Nausea, vomiting and abdominal pain. Evaluate for gastroparesis.  EXAM: NUCLEAR MEDICINE GASTRIC EMPTYING SCAN  TECHNIQUE: After oral ingestion of radiolabeled meal, sequential abdominal images were obtained for 4 hours. Percentage of activity emptying the stomach calculated at 1 hour, 2 hour, 3 hour, and 4 hours.  RADIOPHARMACEUTICALS:  2.0 mCi Technetium 99-m labeled sulfur colloid  COMPARISON:  CT abdomen pelvis -06/14/2014  FINDINGS: Expected location of the stomach in the left upper quadrant. Ingested meal empties the stomach gradually over the course of the study.  84% emptied at 4 hr ( normal >= 90%).  IMPRESSION: Mildly delayed gastric emptying study with  emptying of approximately 84% of gastric contents on the 4 hr anterior projection planar image (normal > 90%).   Electronically Signed   By: Sandi Mariscal M.D.   On: 06/15/2014 16:14   US Abdomen Complete  06/19/2014   CLINICAL DATA:  Patient a nausea and vomiting.   Abdominal back pain.  EXAM: ULTRASOUND ABDOMEN COMPLETE  COMPARISON:  MRI abdomen 03/11/2014  FINDINGS: Gallbladder: No gallstones or wall thickening visualized. No sonographic Murphy sign noted.  Common bile duct: Diameter: 3.8 mm  Liver: Diffusely increased in echogenicity, compatible with steatosis.  IVC: No abnormality visualized.  Pancreas: Poorly visualized.  Spleen: Size and appearance within normal limits.  Right Kidney: Length: 10.6 cm. Echogenicity within normal limits. No mass or hydronephrosis visualized.  Left Kidney: Length: 10.2 cm. No hydronephrosis. There is a 2.0 cm cyst within the lower pole.  Abdominal aorta: No aneurysm visualized.  Other findings: None.  IMPRESSION: No cholelithiasis or sonographic evidence for acute cholecystitis.  No hydronephrosis.   Electronically Signed   By: Lovey Newcomer M.D.   On: 06/19/2014 20:48   Dg Chest Port 1 View  06/10/2014   CLINICAL DATA:  Came into ER on 06/09/14 and family c/o seizures. Pt. Cannot keep anything down for past two days and seizures worsened today. Pt. Also has flu like symptoms.  EXAM: PORTABLE CHEST - 1 VIEW  COMPARISON:  03/05/2014  FINDINGS: Lungs are adequately inflated without consolidation or effusion. The cardiomediastinal silhouette is within normal. Remainder of the exam is unchanged.  IMPRESSION: No active disease.   Electronically Signed   By: Marin Olp M.D.   On: 06/10/2014 08:56   Dg Abd Portable 2v  06/10/2014   CLINICAL DATA:  Persistent vomiting and abdominal pain. Lower abdominal pain.  EXAM: PORTABLE ABDOMEN - 2 VIEW  COMPARISON:  MRI abdomen 03/11/2014  FINDINGS: The bowel gas pattern is normal. There is no evidence of free air. Surgical clips. No radio-opaque calculi or other significant radiographic abnormality is seen.  IMPRESSION: Negative.   Electronically Signed   By: Lucienne Capers M.D.   On: 06/10/2014 21:14        Subjective: Patient continues to complain of pain all over and abdominal pain. No  documented evidence of vomiting from nursing staff. Denies any fevers, chest pain, shortness breath, diarrhea. No dysuria or hematuria. No headaches.   Objective: Filed Vitals:   06/22/14 1654 06/22/14 2137 06/23/14 0700 06/23/14 0940  BP: 115/72 101/54 103/55 107/66  Pulse: 93 85 93 99  Temp: 98.1 F (36.7 C) 98.3 F (36.8 C) 97.9 F (36.6 C) 98.4 F (36.9 C)  TempSrc: Oral Oral Oral Oral  Resp: 16 18 18 18   Height:      Weight:      SpO2: 100% 98% 99% 99%    Intake/Output Summary (Last 24 hours) at 06/23/14 1151 Last data filed at 06/22/14 1251  Gross per 24 hour  Intake    240 ml  Output      0 ml  Net    240 ml   Weight change:  Exam:   General:  Pt is alert, follows commands appropriately, not in acute distress  HEENT: No icterus, No thrush,  [place]/AT  Cardiovascular: RRR, S1/S2, no rubs, no gallops  Respiratory: CTA bilaterally, no wheezing, no crackles, no rhonchi  Abdomen: Soft/+BS, epigastric pain without any rebound, non distended, no guarding  Extremities: No edema, No lymphangitis, No petechiae, No rashes, no synovitis  Data Reviewed: Basic Metabolic Panel:  Recent Labs Lab 06/18/14 0425 06/20/14 0620 06/21/14 0500 06/22/14 0515 06/23/14 0440  NA 139 141 139 141 137  K 3.2* 3.2* 3.9 2.9* 3.2*  CL 109 107 109 109 104  CO2 29 28 25 26 25   GLUCOSE 86 95 120* 123* 130*  BUN <5* <5* <5* <5* 6  CREATININE 0.87 1.01 0.93 1.08 0.97  CALCIUM 8.4 8.6 8.4 8.1* 8.2*   Liver Function Tests:  Recent Labs Lab 06/20/14 0620  AST 31  ALT 14  ALKPHOS 65  BILITOT 0.4  PROT 5.4*  ALBUMIN 3.2*   No results for input(s): LIPASE, AMYLASE in the last 168 hours. No results for input(s): AMMONIA in the last 168 hours. CBC:  Recent Labs Lab 06/21/14 0500 06/22/14 0515 06/23/14 0440  WBC 8.1 9.0 9.4  HGB 9.2* 8.7* 8.8*  HCT 27.7* 26.7* 27.0*  MCV 82.2 83.4 81.3  PLT 180 171 182   Cardiac Enzymes: No results for input(s): CKTOTAL, CKMB, CKMBINDEX,  TROPONINI in the last 168 hours. BNP: Invalid input(s): POCBNP CBG: No results for input(s): GLUCAP in the last 168 hours.  No results found for this or any previous visit (from the past 240 hour(s)).   Scheduled Meds: . antiseptic oral rinse  7 mL Mouth Rinse BID  . desmopressin  10 mcg Nasal BID  . fentaNYL  50 mcg Transdermal Q72H  . gabapentin  300 mg Oral TID  . heparin  5,000 Units Subcutaneous 3 times per day  . hydrocortisone  25 mg Oral Q12H  . levETIRAcetam  1,000 mg Oral BID  . levothyroxine  100 mcg Oral QAC breakfast  . mirtazapine  15 mg Oral QHS  . pantoprazole  40 mg Oral BID  . potassium chloride  10 mEq Intravenous Q1 Hr x 4  . sodium chloride  10-40 mL Intracatheter Q12H  . sucralfate  1 g Oral TID WC & HS   Continuous Infusions: . sodium chloride 0.9 % 1,000 mL with potassium chloride 20 mEq infusion       Carlyle Achenbach, DO  [facility] Pager [phone]  If 7PM-7AM, please contact night-coverage [email or web address] Password [email or web address] 06/23/2014, 11:51 AM   LOS: 13 days

## 2014-06-23 NOTE — Discharge Summary (Signed)
Physician Discharge Summary  Shelly Le WVP:[id number] DOB: 1961/01/28 DOA: 06/10/2014  PCP: Daneil Dan, MD  Admit date: 06/10/2014 Discharge date:06/24/2014 Recommendations for Outpatient Follow-up:  1. Pt will need to follow up with PCP in 2 weeks post discharge 2. Please obtain BMP and CBC in 1-2 weeks  Discharge Diagnoses:  Nausea/vomiting/abdominal pain/gastroparesis, narcotic bowel syndrome - LFTs normal, patient had MRI abdomen 11/15 showed 2.7 centimeter hemorrhagic left renal sinus cyst otherwise no abdominal pathology, upper quadrant ultrasound in 11/15 had shown minimal sludge in gallbladder otherwise normal - CT abdomen and pelvis w/o contrast (patient has contrast allergy) which showed possible gastroparesis - nuclear medicine gastric empty study showed mild gastroparesis -GI placed on Reglan ( no improvement per patient) and PPI - Placed on scheduled Phenergan , Compazine as needed, GI recommending right upper quadrant ultrasound. -US abdomen negative for cholelithiasis or cholecystitis -06/21/2014 EGD (Dr. Kathi Simpers shallow ulcers on the lesser curvature of the stomach--pathology hemorrhagic gastritis with ulcer and necrosis -Continue PPI and Carafate after d/c -06/23/14 case discussed with Dr. Konrad Dolores else to offer from GI standpoint -I have explained to the patient that I will not change any of her current dosing of opioids or anti-medics on almost there is objective evidence of clinical worsening or recurrent vomiting -Patient stated that she would be better if her diet was more palatable--> changed to regular diet which pt tolerated Seizures v/s pseudoseizures - Neurology was consulted, patient underwent MRI of the brain which did not show any mass or CVA  - EEG was normal, no new recommendations from neurology  - Patient was seen by psychiatry, Dr. Kathie Dike on 2/9 and recommended continue current medication management without any changes and  monitor for drug seeking behavior especially opiates and benzodiazepine. 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  -Continue current dosing of Keppra  -During my evaluations, the patient was complaining of left facial droop and left eye twitching and ptosis during her description. However, as the patient continued to carry on a conversation and become distracted, her deficits and complaints were completely resolved. Throughout prolonged conversations, whenever she would complain of the left facial droop and eye twitching, she will continue to demonstrate these deficits, but again would resolve when her conversation was re-directed to a different topic.  Neuro exam is nonfocal Hypokalemia -Repleted -potassium 4.1 on day of d/c SIRS; no clear etiology of sepsis - UA negative for UTI, chest x-ray negative for pneumonia, LP/CSF without evidence of meningitis/encephalitis, influenza panel negative - CSF cultures, VDRL, HSV negative  -Blood cultures and urine cultures unremarkable  -Presently afebrile and hemodynamically stable  -Patient's hypotension likely contributed by her Addison's disease -on day of d/c pt's vital signs completely stable without fever or hypotension--> 98.0--RR16--HR 90--99/48--99% RA _0  on day of d/c Hypotension; BP stable.  -secondary to medication,. Vs adrenal insufficiency.  -Blood pressure has improved and remained clinically stable over the last 48 hours  Lactic acid normal.  -Continue to wean hydrocortisone, change to by mouth formulation  -Patient tolerated oral hydrocortisone. Her blood pressure remained stable- Anemia;  -Drop in patient's hemoglobin is partly dilutional from fluids -No signs of active bleeding at this time  -Baseline hemoglobin 10-11   Acute renal failure prerenal azotemia given history of recurrent nausea and vomiting - creatinine peaked at 1.44 -  Currently resolved and stable  -Serum creatinine 0.87 on the day of discharge Narcotic seeking behavior/Chronic pain syndrome - Previous records  reviewed, will avoid narcotics as it can exacerbate and worsen gastroparesis and narcotic bowel syndrome  -I have explained to the patient the risks of continued opioid use. I also explained to her that I will not change her current dosing of her opioids at this time  -pt was started on fentanyl 52mg daily during the hospitalization which she tolerated.  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 -She will go home with hydrocortisone 25 mg twice a day for 2 additional days, then 20 mg twice a day 2 days, then back to her usual dose of 20 mg in the morning, 10 mg in the p.m. DI Continue with DDAVP - Na and volume status currently normal  Generalized pain;  -Check ESR =7  -no abdominal pain on exam when patient is distracted Adjustment D/O As per Psychiatry   Code Status: FULL     Consultants: Psychiatry  Neuro Gastroenterology  Procedures: EEG - 2/8 - normal EEG   Antibiotics: Acyclovir 2/7 > 2/9  Discharge Condition: stable  Disposition: home  Diet:regular Wt Readings from Last 3 Encounters:  06/14/14 72.576 kg (160 lb)  04/12/14 70.308 kg (155 lb)  03/07/14 68.04 kg (150 lb)    History of present illness:  53 y27female with a history of pituitary adenoma resection on chronic steroids, 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 Apparently  since 06/06/14 patient had been having intermittent fevers. She started having tingling and numbness of her bilateral upper extremities, then persistent nausea with vomiting. She stated she had not been able to keep anything down. Patient was on the phone with her daughter when she began to have sx c/w her usual seizure activity. When the daughter arrived at the pt's place, patient was lying on a couch and was very lethargic. [facility] got into the [facility] patient was noted to be anxious, rolling around and shaking. Although, there were no obvious seizure-like movements, because of a history of fever, headache and possible worsening seizures, patient underwent a lumbar puncture which did not suggest an infectious process.  Interim history  After I introduced myself, the patient initially begins to explain how her current pain and antiemetic medications are not working for her. She continues to explain that none of her other physicians have been listening to her, and she felt that they have not been treating her properly displayed her complaints of continued pain. She stated that only hydromorphone and Phenergan have worked in the past. Nursing and ancillary staff have not reported any witnessed vomiting. In fact patient has been tolerating her diet. I've explained to the patient that increasing her opioids at this time will worsen her clinical condition. I also stated that I will not change her current opioid regimen     Discharge Exam: Filed Vitals:   06/24/14 1042  BP: 112/63  Pulse: 88  Temp: 98.2 F (36.8 C)  Resp: 20   Filed Vitals:   06/23/14 2223 06/24/14 0133 06/24/14 [id number]  1042  BP: 85/44 92/50 101/54 112/63  Pulse: 92 92 81 88  Temp: 98.3 F (36.8 C) 97.9 F (36.6 C) 97.8 F (36.6 C) 98.2 F (36.8 C)  TempSrc: Oral Oral Oral Oral  Resp: _0 Height:      Weight:      SpO2: 98%  99% 98% 99%   General: A&O x 3, NAD, pleasant, cooperative Cardiovascular: RRR, no rub, no gallop, no S3 Respiratory: CTAB, no wheeze, no rhonchi Abdomen:soft, epigatric tender without rebound; nondistended, positive bowel sounds Extremities: No edema, No lymphangitis, no petechiae  Discharge Instructions      Discharge Instructions    Diet - low sodium heart healthy    Complete by:  As directed      Discharge instructions    Complete by:  As directed   Cortef 14m tabs (hydrocortisone)--take 2.5 tabs (283m two times a day x 2 days, then take 2 tabs (2040mtwice a day x 2 days, then go back to your usual dose of 69m48m morning and 10mg66mevening every day     Increase activity slowly    Complete by:  As directed             Medication List    STOP taking these medications        fentaNYL 100 MCG/HR  Commonly known as:  DURAGESIC - dosed mcg/hr  Replaced by:  fentaNYL 50 MCG/HR     oxyCODONE-acetaminophen 5-325 MG per tablet  Commonly known as:  PERCOCET/ROXICET      TAKE these medications        ALPRAZolam 1 MG tablet  Commonly known as:  XANAX  Take 1 mg by mouth 3 (three) times daily as needed for anxiety.     azelastine 0.05 % ophthalmic solution  Commonly known as:  OPTIVAR  Place 1 drop into both eyes at bedtime.     desmopressin 0.01 % Soln  Commonly known as:  DDAVP  Place 1 spray into the nose 2 (two) times daily. 1 spray - 10 mcg     fentaNYL 50 MCG/HR  Commonly known as:  DURAGESIC - dosed mcg/hr  Place 1 patch (50 mcg total) onto the skin every 3 (three) days.     gabapentin 300 MG capsule  Commonly known as:  NEURONTIN  Take 300 mg by mouth 3 (three) times daily.     HYDROcodone-acetaminophen 5-325 MG per tablet  Commonly known as:  NORCO/VICODIN  Take 1 tablet by mouth every 6 (six) hours as needed for moderate pain.     hydrocortisone 20 MG tablet  Commonly known as:  CORTEF  Take 10-60 mg by mouth daily. Take 1 tablet (20 mg) every  morning and 1/2 tablet (10 mg) every evening; may double or triple the dose in the event of stress or acute illness     hydrocortisone 10 MG tablet  Commonly known as:  CORTEF  Take 2.5 tablets (25 mg total) by mouth every 12 (twelve) hours. X 2 days. Then 2 tabs (69mg)73mce a day x 2 days, then go back to usual dose (69mg i39m, 10mg in13m     levETIRAcetam 1000 MG tablet  Commonly known as:  KEPPRA  Take 1,000 mg by mouth 2 (two) times daily.     levothyroxine 100 MCG tablet  Commonly known as:  SYNTHROID, LEVOTHROID  Take 100 mcg by mouth daily before breakfast.     methocarbamol 500 MG tablet  Commonly known as:  ROBAXIN  Take 1 tablet (500 mg total) by mouth 2 (two) times daily.     mirtazapine 15 MG tablet  Commonly known as:  REMERON  Take 15 mg by mouth at bedtime.     oxyCODONE 15 MG immediate release tablet  Commonly known as:  ROXICODONE  Take 15 mg by mouth 3 (three) times daily as needed for pain.     pantoprazole 40 MG tablet  Commonly known as:  PROTONIX  Take 1 tablet (40 mg total) by mouth 2 (two) times daily.     potassium chloride 10 MEQ CR capsule  Commonly known as:  MICRO-K  Take 10 mEq by mouth 2 (two) times daily.     promethazine 25 MG tablet  Commonly known as:  PHENERGAN  Take 25 mg by mouth every 6 (six) hours as needed for nausea or vomiting.     sucralfate 1 G tablet  Commonly known as:  CARAFATE  Take 1 tablet (1 g total) by mouth 4 (four) times daily -  with meals and at bedtime.     tiZANidine 4 MG tablet  Commonly known as:  ZANAFLEX  Take 8 mg by mouth every 8 (eight) hours as needed for muscle spasms.         The results of significant diagnostics from this hospitalization (including imaging, microbiology, ancillary and laboratory) are listed below for reference.    Significant Diagnostic Studies: Ct Abdomen Pelvis Wo Contrast  06/14/2014   CLINICAL DATA:  Abdominal pain. Addison's disease. Diabetes insipidus  EXAM: CT  ABDOMEN AND PELVIS WITHOUT CONTRAST  TECHNIQUE: Multidetector CT imaging of the abdomen and pelvis was performed following the standard protocol without oral or intravenous contrast material administration.  COMPARISON:  March 07, 2014 CT abdomen and pelvis; MRI abdomen March 11, 2014  FINDINGS: There are bilateral pleural effusions with bibasilar lung atelectatic change. There is a small hiatal hernia.  No focal liver lesions are identified on this noncontrast enhanced study. There is hepatic steatosis with fatty sparing near the gallbladder fossa region. The gallbladder wall is not appreciably thickened. There is no biliary duct dilatation.  Spleen and pancreas appear normal. Adrenals are normal in size and contour bilaterally.  The previously noted mass in the anterior mid left kidney appears stable, measuring 1.7 x 1.5 cm in size. 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 There is a tiny cyst in the posterior mid right kidney. No hydronephrosis is appreciable on either side. There is a 1 mm calculus in the medial upper pole left kidney. No other renal calculi seen. No ureteral calculi are identified. There is a retroaortic left renal vein, an anatomic variant.  In the pelvis, the urinary bladder is midline with normal wall thickness. Uterus is absent. There is no pelvic mass or pelvic fluid collection. The appendix appears normal.  There are several sites of air in the anterior abdominal wall on the right, probably injection related. Small injection granulomas are again noted in comparison with the prior study.  There is no bowel obstruction. No free air or portal venous air. The stomach appears mildly distended without appreciable gastric wall thickening. There is no appreciable ascites, adenopathy, or abscess in the abdomen or pelvis. There is no demonstrable abdominal aortic aneurysm. There are no blastic or lytic bone lesions.  IMPRESSION: No bowel obstruction. No abscess. Note that  the stomach is mildly distended with air. If gastric paresis is of clinical concern, a  nuclear medicine gastric emptying study could be quite helpful for further assessment.  1 mm calculus left kidney. 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 No hydronephrosis. No ureteral calculi.  Uterus absent.  Appendix normal.   Electronically Signed   By: Lowella Grip III M.D.   On: 06/14/2014 15:28   Ct Head Wo Contrast  06/10/2014   CLINICAL DATA:  Numerous seizures since 06/09/2014. History of right frontal craniotomy.  EXAM: CT HEAD WITHOUT CONTRAST  TECHNIQUE: Contiguous axial images were obtained from the base of the skull through the vertex without intravenous contrast.  COMPARISON:  02/26/2014; 02/23/2014; brain MRI - 02/26/2014  FINDINGS: Stable sequela of right frontal craniotomy.  The gray-white differentiation is maintained. No CT evidence of acute large territory infarct. No intraparenchymal or extra-axial mass or hemorrhage. Normal size and configuration of the ventricles and basilar cisterns. No midline shift. Limited visualization of the paranasal sinuses and mastoid air cells is normal. No air-fluid levels. Regional soft tissues appear normal. No displaced calvarial fracture.  IMPRESSION: 1. No acute intracranial process. 2. Stable sequela of prior right frontal craniotomy.   Electronically Signed   By: Sandi Mariscal M.D.   On: 06/10/2014 09:03   Mr Brain Wo Contrast  06/11/2014   CLINICAL DATA:  Initial valuation for seizure, with fever. Also with receded recent herpetic outbreak. Concern for possible intracranial infection.  EXAM: MRI HEAD WITHOUT CONTRAST  TECHNIQUE: Multiplanar, multiecho pulse sequences of the brain and surrounding structures were obtained without intravenous contrast.  COMPARISON:  Prior CT from 06/10/2014 as well as previous MRI from 02/26/2014.  FINDINGS: Cerebral volume within normal limits for patient age. No significant white matter changes  identified.  No focal parenchymal signal abnormality. No evidence for infarct or hemorrhage. Gray-white matter differentiation maintained. Normal intravascular flow voids present.  Hippocampi are normal in morphology with normal signal intensity bilaterally. No signal abnormality to suggest herpes encephalitis or other active infection.  Cerebellar tonsils are minimally low lying, by within normal limits. Pituitary grossly unremarkable on this non pituitary protocol MRI. No acute abnormality seen about the orbits. Mild exophthalmos noted.  Paranasal sinuses are clear.  No mastoid effusion.  Postoperative changes from prior right frontal craniotomy. Calvarium otherwise unremarkable. Scalp soft tissues within normal limits.  IMPRESSION: 1. No acute intracranial process identified. No signal changes to suggest active intracranial infection identified. 2. Prior right frontal craniotomy.   Electronically Signed   By: Jeannine Boga M.D.   On: 06/11/2014 06:24   Nm Gastric Emptying  06/15/2014   CLINICAL DATA:  Nausea, vomiting and abdominal pain. Evaluate for gastroparesis.  EXAM: NUCLEAR MEDICINE GASTRIC EMPTYING SCAN  TECHNIQUE: After oral ingestion of radiolabeled meal, sequential abdominal images were obtained for 4 hours. Percentage of activity emptying the stomach calculated at 1 hour, 2 hour, 3 hour, and 4 hours.  RADIOPHARMACEUTICALS:  2.0 mCi Technetium 99-m labeled sulfur colloid  COMPARISON:  CT abdomen pelvis -06/14/2014  FINDINGS: Expected location of the stomach in the left upper quadrant. Ingested meal empties the stomach gradually over the course of the study.  84% emptied at 4 hr ( normal >= 90%).  IMPRESSION: Mildly delayed gastric emptying study with emptying of approximately 84% of gastric contents on the 4 hr anterior projection planar image (normal > 90%).   Electronically Signed   By: Sandi Mariscal M.D.   On: 06/15/2014 16:14   US Abdomen Complete  06/19/2014   CLINICAL DATA:  Patient a  nausea and vomiting.  Abdominal back pain.  EXAM: ULTRASOUND ABDOMEN COMPLETE  COMPARISON:  MRI abdomen 03/11/2014  FINDINGS: Gallbladder: No gallstones or wall thickening visualized. No sonographic Murphy sign noted.  Common bile duct: Diameter: 3.8 mm  Liver: Diffusely increased in echogenicity, compatible with steatosis.  IVC: No abnormality visualized.  Pancreas: Poorly visualized.  Spleen: Size and appearance within normal limits.  Right Kidney: Length: 10.6 cm. Echogenicity within normal limits. No mass or hydronephrosis visualized.  Left Kidney: Length: 10.2 cm. No hydronephrosis. There is a 2.0 cm cyst within the lower pole.  Abdominal aorta: No aneurysm visualized.  Other findings: None.  IMPRESSION: No cholelithiasis or sonographic evidence for acute cholecystitis.  No hydronephrosis.   Electronically Signed   By: Lovey Newcomer M.D.   On: 06/19/2014 20:48   Dg Chest Port 1 View  06/10/2014   CLINICAL DATA:  Came into ER on 06/09/14 and family c/o seizures. Pt. Cannot keep anything down for past two days and seizures worsened today. Pt. Also has flu like symptoms.  EXAM: PORTABLE CHEST - 1 VIEW  COMPARISON:  03/05/2014  FINDINGS: Lungs are adequately inflated without consolidation or effusion. The cardiomediastinal silhouette is within normal. Remainder of the exam is unchanged.  IMPRESSION: No active disease.   Electronically Signed   By: Marin Olp M.D.   On: 06/10/2014 08:56   Dg Abd Portable 2v  06/10/2014   CLINICAL DATA:  Persistent vomiting and abdominal pain. Lower abdominal pain.  EXAM: PORTABLE ABDOMEN - 2 VIEW  COMPARISON:  MRI abdomen 03/11/2014  FINDINGS: The bowel gas pattern is normal. There is no evidence of free air. Surgical clips. No radio-opaque calculi or other significant radiographic abnormality is seen.  IMPRESSION: Negative.   Electronically Signed   By: Lucienne Capers M.D.   On: 06/10/2014 21:14     Microbiology: No results found for this or any previous visit (from the  past 240 hour(s)).   Labs: Basic Metabolic Panel:  Recent Labs Lab 06/20/14 0620 06/21/14 0500 06/22/14 0515 06/23/14 0440 06/24/14 0536  NA 141 139 141 137 138  K 3.2* 3.9 2.9* 3.2* 4.1  CL 107 109 109 104 107  CO2 _0 GLUCOSE 95 120* 123* 130* 124*  BUN <5* <5* <5* 6 6  CREATININE 1.01 0.93 1.08 0.97 0.87  CALCIUM 8.6 8.4 8.1* 8.2* 8.3*   Liver Function Tests:  Recent Labs Lab 06/20/14 0620  AST 31  ALT 14  ALKPHOS 65  BILITOT 0.4  PROT 5.4*  ALBUMIN 3.2*   No results for input(s): LIPASE, AMYLASE in the last 168 hours. No results for input(s): AMMONIA in the last 168 hours. CBC:  Recent Labs Lab 06/21/14 0500 06/22/14 0515 06/23/14 0440 06/24/14 0536  WBC 8.1 9.0 9.4 7.5  HGB 9.2* 8.7* 8.8* 8.8*  HCT 27.7* 26.7* 27.0* 27.2*  MCV 82.2 83.4 81.3 82.4  PLT 180 171 182 185   Cardiac Enzymes: No results for input(s): CKTOTAL, CKMB, CKMBINDEX, TROPONINI in the last 168 hours. BNP: Invalid input(s): POCBNP CBG: No results for input(s): GLUCAP in the last 168 hours.  Time coordinating discharge:  Greater than 30 minutes  Signed:  Jceon Alverio, DO [facility] Pager: [phone] 06/24/2014, 12:25 PM

## 2014-06-23 NOTE — Progress Notes (Signed)
[facility] PROGRESS NOTE Subjective patient needing. Multiple complaints. Still aches all over has chronic pain. Pathology on ulcers showed just gastritis and ulceration that was benign. Probably medication related. States she felt better after careful  Objective: Vital signs in last 24 hours: Temp:  [97.9 F (36.6 C)-98.4 F (36.9 C)] 98.4 F (36.9 C) (02/20 0940) Pulse Rate:  [85-99] 99 (02/20 0940) Resp:  [16-20] 18 (02/20 0940) BP: (91-115)/(49-72) 107/66 mmHg (02/20 0940) SpO2:  [98 %-100 %] 99 % (02/20 0940) Last BM Date: 06/21/14  Intake/Output from previous day: 02/19 0701 - 02/20 0700 In: 240 [P.O.:240] Out: -  Intake/Output this shift:    PE: General--patient sitting in bed eating lunch no distress  Abdomen-- nontender  Lab Results:  Recent Labs  06/21/14 0500 06/22/14 0515 06/23/14 0440  WBC 8.1 9.0 9.4  HGB 9.2* 8.7* 8.8*  HCT 27.7* 26.7* 27.0*  PLT 180 171 182   BMET  Recent Labs  06/21/14 0500 06/22/14 0515 06/23/14 0440  NA 139 141 137  K 3.9 2.9* 3.2*  CL 109 109 104  CO2 25 26 25   CREATININE 0.93 1.08 0.97   LFT No results for input(s): PROT, AST, ALT, ALKPHOS, BILITOT, BILIDIR, IBILI in the last 72 hours. PT/INR No results for input(s): LABPROT, INR in the last 72 hours. PANCREAS No results for input(s): LIPASE in the last 72 hours.       Studies/Results: No results found.  Medications: I have reviewed the patient's current medications.  Assessment/Plan: 1. Gastric ulcer. Better with carafate would go ahead and discharge her on this medication. Biopsies benign with no H pylori etc. Not sure this is really causing all of her symptoms. Would discharge on carafate and PPI. 2. Multiple complaints of pain probably due to chronic pain syndrome.  We will sign off please call us back if needed  Linette Gunderson JR,Stefanos Haynesworth L 06/23/2014, 12:32 PM

## 2014-06-24 LAB — BASIC METABOLIC PANEL
Anion gap: 4 — ABNORMAL LOW (ref 5–15)
BUN: 6 mg/dL (ref 6–23)
CO2: 27 mmol/L (ref 19–32)
Calcium: 8.3 mg/dL — ABNORMAL LOW (ref 8.4–10.5)
Chloride: 107 mmol/L (ref 96–112)
Creatinine, Ser: 0.87 mg/dL (ref 0.50–1.10)
GFR calc Af Amer: 86 mL/min — ABNORMAL LOW (ref >90)
GFR, EST NON AFRICAN AMERICAN: 74 mL/min — AB (ref >90)
Glucose, Bld: 124 mg/dL — ABNORMAL HIGH (ref 70–99)
POTASSIUM: 4.1 mmol/L (ref 3.5–5.1)
Sodium: 138 mmol/L (ref 135–145)

## 2014-06-24 LAB — CBC
HCT: 27.2 % — ABNORMAL LOW (ref 36.0–46.0)
HEMOGLOBIN: 8.8 g/dL — AB (ref 12.0–15.0)
MCH: 26.7 pg (ref 26.0–34.0)
MCHC: 32.4 g/dL (ref 30.0–36.0)
MCV: 82.4 fL (ref 78.0–100.0)
Platelets: 185 10*3/uL (ref 150–400)
RBC: 3.3 MIL/uL — ABNORMAL LOW (ref 3.87–5.11)
RDW: 14.8 % (ref 11.5–15.5)
WBC: 7.5 10*3/uL (ref 4.0–10.5)

## 2014-06-24 MED ORDER — FENTANYL 50 MCG/HR TD PT72: 50.0000 ug | MEDICATED_PATCH | TRANSDERMAL | Status: DC

## 2014-06-24 MED ORDER — HYDROCORTISONE 10 MG PO TABS: 25.0000 mg | ORAL_TABLET | Freq: Two times a day (BID) | ORAL | Status: DC

## 2014-06-24 MED ORDER — PANTOPRAZOLE SODIUM 40 MG PO TBEC: 40.0000 mg | DELAYED_RELEASE_TABLET | Freq: Two times a day (BID) | ORAL | Status: DC

## 2014-06-24 MED ORDER — SUCRALFATE 1 G PO TABS: 1.0000 g | ORAL_TABLET | Freq: Three times a day (TID) | ORAL | Status: DC

## 2014-06-24 MED ORDER — HYDROCODONE-ACETAMINOPHEN 5-325 MG PO TABS: 1.0000 | ORAL_TABLET | Freq: Four times a day (QID) | ORAL | Status: DC | PRN

## 2014-06-24 NOTE — Progress Notes (Signed)
Patient for discharge home today; awaiting IV team to discontinue her PICC line; case manager called patient's son and daughter for discharge home today; [place] arranged; will review discharge instructions at time of discharge this afternoon.

## 2014-06-24 NOTE — Progress Notes (Signed)
Shelly Le is complaining of sweats; reports three episodes threw the night and one this morning; visible sweat on her face; no fever; denies sinus drainage or cough; denies nausea; abd. Remains tender.  Reports difficulty raising her left eye open; keeping it open; will alert MD.

## 2014-06-24 NOTE — Progress Notes (Signed)
Patient ready for discharge; discharge instructions given and reviewed; Rx's given; daughter here to take patient home;patient discharged via wheelchair.

## 2014-11-14 ENCOUNTER — Inpatient Hospital Stay (HOSPITAL_COMMUNITY)
Admission: EM | Admit: 2014-11-14 | Discharge: 2014-11-20 | DRG: 882 | Disposition: A | Discharge status: Skilled Nursing Facility | Payer: Medicare Other | Attending: Internal Medicine | Admitting: Internal Medicine

## 2014-11-14 ENCOUNTER — Emergency Department (HOSPITAL_COMMUNITY): Payer: Medicare Other

## 2014-11-14 ENCOUNTER — Encounter (HOSPITAL_COMMUNITY): Payer: Self-pay | Admitting: Emergency Medicine

## 2014-11-14 DIAGNOSIS — F4329 Adjustment disorder with other symptoms: Secondary | ICD-10-CM | POA: Diagnosis present

## 2014-11-14 DIAGNOSIS — Z9114 Patient's other noncompliance with medication regimen: Secondary | ICD-10-CM | POA: Diagnosis present

## 2014-11-14 DIAGNOSIS — G40909 Epilepsy, unspecified, not intractable, without status epilepticus: Secondary | ICD-10-CM | POA: Diagnosis present

## 2014-11-14 DIAGNOSIS — F4323 Adjustment disorder with mixed anxiety and depressed mood: Secondary | ICD-10-CM | POA: Diagnosis not present

## 2014-11-14 DIAGNOSIS — Z765 Malingerer [conscious simulation]: Secondary | ICD-10-CM | POA: Diagnosis present

## 2014-11-14 DIAGNOSIS — E232 Diabetes insipidus: Secondary | ICD-10-CM | POA: Diagnosis present

## 2014-11-14 DIAGNOSIS — E860 Dehydration: Secondary | ICD-10-CM | POA: Diagnosis present

## 2014-11-14 DIAGNOSIS — D72829 Elevated white blood cell count, unspecified: Secondary | ICD-10-CM | POA: Diagnosis present

## 2014-11-14 DIAGNOSIS — R569 Unspecified convulsions: Secondary | ICD-10-CM | POA: Diagnosis present

## 2014-11-14 DIAGNOSIS — E039 Hypothyroidism, unspecified: Secondary | ICD-10-CM | POA: Diagnosis present

## 2014-11-14 DIAGNOSIS — R000 Tachycardia, unspecified: Secondary | ICD-10-CM | POA: Diagnosis present

## 2014-11-14 DIAGNOSIS — K3184 Gastroparesis: Secondary | ICD-10-CM | POA: Diagnosis present

## 2014-11-14 DIAGNOSIS — G8929 Other chronic pain: Secondary | ICD-10-CM | POA: Diagnosis present

## 2014-11-14 DIAGNOSIS — E038 Other specified hypothyroidism: Secondary | ICD-10-CM | POA: Diagnosis present

## 2014-11-14 DIAGNOSIS — R0600 Dyspnea, unspecified: Secondary | ICD-10-CM

## 2014-11-14 DIAGNOSIS — I471 Supraventricular tachycardia: Secondary | ICD-10-CM | POA: Diagnosis present

## 2014-11-14 DIAGNOSIS — Z9119 Patient's noncompliance with other medical treatment and regimen: Secondary | ICD-10-CM | POA: Diagnosis present

## 2014-11-14 DIAGNOSIS — G894 Chronic pain syndrome: Secondary | ICD-10-CM | POA: Diagnosis present

## 2014-11-14 DIAGNOSIS — E271 Primary adrenocortical insufficiency: Secondary | ICD-10-CM | POA: Diagnosis present

## 2014-11-14 DIAGNOSIS — Z7952 Long term (current) use of systemic steroids: Secondary | ICD-10-CM

## 2014-11-14 DIAGNOSIS — R0602 Shortness of breath: Secondary | ICD-10-CM | POA: Diagnosis not present

## 2014-11-14 DIAGNOSIS — R1310 Dysphagia, unspecified: Secondary | ICD-10-CM | POA: Diagnosis present

## 2014-11-14 DIAGNOSIS — Z7289 Other problems related to lifestyle: Secondary | ICD-10-CM

## 2014-11-14 DIAGNOSIS — M542 Cervicalgia: Secondary | ICD-10-CM | POA: Diagnosis present

## 2014-11-14 DIAGNOSIS — D352 Benign neoplasm of pituitary gland: Secondary | ICD-10-CM | POA: Diagnosis present

## 2014-11-14 DIAGNOSIS — R51 Headache: Secondary | ICD-10-CM | POA: Diagnosis not present

## 2014-11-14 DIAGNOSIS — F445 Conversion disorder with seizures or convulsions: Secondary | ICD-10-CM | POA: Diagnosis present

## 2014-11-14 LAB — I-STAT TROPONIN, ED: Troponin i, poc: 0 ng/mL (ref 0.00–0.08)

## 2014-11-14 LAB — CBC
HEMATOCRIT: 40.4 % (ref 36.0–46.0)
HEMOGLOBIN: 13.5 g/dL (ref 12.0–15.0)
MCH: 26.7 pg (ref 26.0–34.0)
MCHC: 33.4 g/dL (ref 30.0–36.0)
MCV: 79.8 fL (ref 78.0–100.0)
Platelets: 361 10*3/uL (ref 150–400)
RBC: 5.06 MIL/uL (ref 3.87–5.11)
RDW: 13.6 % (ref 11.5–15.5)
WBC: 11.2 10*3/uL — ABNORMAL HIGH (ref 4.0–10.5)

## 2014-11-14 LAB — BASIC METABOLIC PANEL
ANION GAP: 15 (ref 5–15)
BUN: 12 mg/dL (ref 6–20)
CO2: 20 mmol/L — AB (ref 22–32)
Calcium: 9.6 mg/dL (ref 8.9–10.3)
Chloride: 104 mmol/L (ref 101–111)
Creatinine, Ser: 1.36 mg/dL — ABNORMAL HIGH (ref 0.44–1.00)
GFR calc Af Amer: 50 mL/min — ABNORMAL LOW (ref >60)
GFR calc non Af Amer: 43 mL/min — ABNORMAL LOW (ref >60)
Glucose, Bld: 122 mg/dL — ABNORMAL HIGH (ref 65–99)
Potassium: 4.1 mmol/L (ref 3.5–5.1)
Sodium: 139 mmol/L (ref 135–145)

## 2014-11-14 LAB — I-STAT CG4 LACTIC ACID, ED: Lactic Acid, Venous: 1.24 mmol/L (ref 0.5–2.0)

## 2014-11-14 MED ORDER — LORAZEPAM 2 MG/ML IJ SOLN
2.0000 mg | Freq: Once | INTRAMUSCULAR | Status: AC
  Administered 2014-11-14: 2 mg via INTRAVENOUS

## 2014-11-14 MED ORDER — LORAZEPAM 2 MG/ML IJ SOLN
INTRAMUSCULAR | Status: AC
  Filled 2014-11-14: qty 1

## 2014-11-14 MED ORDER — SODIUM CHLORIDE 0.9 % IV SOLN
1000.0000 mg | Freq: Once | INTRAVENOUS | Status: AC
  Administered 2014-11-14: 1000 mg via INTRAVENOUS
  Filled 2014-11-14: qty 10

## 2014-11-14 NOTE — ED Notes (Signed)
Contacted pharmacy for STAT keppra

## 2014-11-14 NOTE — ED Notes (Signed)
Pt returned to exam room with eyes open and tremors; informed Dr. Liston Alba and Dr. Roxanne Mins of situation. After returning to room, pt noted to have full convulsions with eyes rolled backward. Pt on oxygen. Sats remained 97%; pulse 160. Dr.Riester at bedside

## 2014-11-14 NOTE — ED Notes (Signed)
Pt at baseline at this time; A&Ox 4. Pt c/o neck stiffness with difficulty with movement.

## 2014-11-14 NOTE — ED Notes (Signed)
Pt stating "I feel like another seizure coming." Tremors noted. Pt remains A&O

## 2014-11-14 NOTE — ED Notes (Addendum)
Pt responsive to name, responds "feeling sore." Seizure like activity for approximately 5 mins; no incontinence or oral trauma

## 2014-11-14 NOTE — ED Notes (Signed)
Pt c/o central chest pain associated with shortness of breath since Monday. Reports difficulty taking a deep breath. Sats remain in the mid-90s. Pt also reports generalized headache with blurred vision onset 4-5 days ago, and worsening today. Voice noted to be hoarse. Hx of seizures, addison's disease, and hypothyroidism; has not taken medications x 5 days "maybe longer." Reports decreased appetite and NV; Pt has been taking hydrocortisone, denies all other medications. Pt also states "I feel like I may have a seizure because I haven't taken my medications. I take Keppra 1000mg  twice a day. I usually have grand mal seizures." Seizure pads placed on bed; pt placed on 2L oxygen; remains A&Ox4.

## 2014-11-14 NOTE — ED Notes (Addendum)
C/o sob and pain across chest since Monday morning.  States she is unable to sleep because she feels like she can't breath.  Also reports burning in throat.

## 2014-11-14 NOTE — ED Notes (Signed)
Pt with seizure-like activity; full body convulsions; lasting ~30mins.

## 2014-11-14 NOTE — ED Notes (Signed)
Pt noted to have tremors; reports "feeling like I'm going to have a seizure." Keppra infusion complete During infusion, pt resting; after completion, tremors noted. Dr.Riester made aware

## 2014-11-14 NOTE — ED Notes (Signed)
Pt with convulsions; unresponsive to pain; lasting ~50min. Pt A&Ox4; c/o difficulty breathing. Pt reports having a port "that was crushed in my chest." per son, pt has had shortness of breath since that incident

## 2014-11-14 NOTE — ED Notes (Signed)
Dr.Riester at bedside

## 2014-11-15 ENCOUNTER — Inpatient Hospital Stay (HOSPITAL_COMMUNITY): Payer: Medicare Other

## 2014-11-15 ENCOUNTER — Encounter (HOSPITAL_COMMUNITY): Payer: Self-pay | Admitting: Internal Medicine

## 2014-11-15 DIAGNOSIS — E038 Other specified hypothyroidism: Secondary | ICD-10-CM | POA: Diagnosis present

## 2014-11-15 DIAGNOSIS — F445 Conversion disorder with seizures or convulsions: Secondary | ICD-10-CM | POA: Diagnosis not present

## 2014-11-15 DIAGNOSIS — D352 Benign neoplasm of pituitary gland: Secondary | ICD-10-CM

## 2014-11-15 DIAGNOSIS — G40909 Epilepsy, unspecified, not intractable, without status epilepticus: Secondary | ICD-10-CM | POA: Diagnosis not present

## 2014-11-15 DIAGNOSIS — G8929 Other chronic pain: Secondary | ICD-10-CM | POA: Diagnosis present

## 2014-11-15 DIAGNOSIS — R1310 Dysphagia, unspecified: Secondary | ICD-10-CM | POA: Diagnosis not present

## 2014-11-15 DIAGNOSIS — G894 Chronic pain syndrome: Secondary | ICD-10-CM | POA: Diagnosis not present

## 2014-11-15 DIAGNOSIS — E860 Dehydration: Secondary | ICD-10-CM | POA: Diagnosis present

## 2014-11-15 DIAGNOSIS — Z9119 Patient's noncompliance with other medical treatment and regimen: Secondary | ICD-10-CM | POA: Diagnosis present

## 2014-11-15 DIAGNOSIS — I471 Supraventricular tachycardia: Secondary | ICD-10-CM | POA: Diagnosis present

## 2014-11-15 DIAGNOSIS — R569 Unspecified convulsions: Secondary | ICD-10-CM

## 2014-11-15 DIAGNOSIS — E039 Hypothyroidism, unspecified: Secondary | ICD-10-CM | POA: Diagnosis present

## 2014-11-15 DIAGNOSIS — R0600 Dyspnea, unspecified: Secondary | ICD-10-CM | POA: Diagnosis not present

## 2014-11-15 DIAGNOSIS — E232 Diabetes insipidus: Secondary | ICD-10-CM | POA: Diagnosis present

## 2014-11-15 DIAGNOSIS — R51 Headache: Secondary | ICD-10-CM | POA: Diagnosis not present

## 2014-11-15 DIAGNOSIS — E271 Primary adrenocortical insufficiency: Secondary | ICD-10-CM

## 2014-11-15 DIAGNOSIS — D72829 Elevated white blood cell count, unspecified: Secondary | ICD-10-CM | POA: Diagnosis present

## 2014-11-15 DIAGNOSIS — M542 Cervicalgia: Secondary | ICD-10-CM | POA: Diagnosis present

## 2014-11-15 DIAGNOSIS — F1910 Other psychoactive substance abuse, uncomplicated: Secondary | ICD-10-CM

## 2014-11-15 DIAGNOSIS — K3184 Gastroparesis: Secondary | ICD-10-CM | POA: Diagnosis present

## 2014-11-15 DIAGNOSIS — Z765 Malingerer [conscious simulation]: Secondary | ICD-10-CM | POA: Diagnosis present

## 2014-11-15 DIAGNOSIS — R000 Tachycardia, unspecified: Secondary | ICD-10-CM | POA: Diagnosis present

## 2014-11-15 DIAGNOSIS — Z7289 Other problems related to lifestyle: Secondary | ICD-10-CM | POA: Diagnosis not present

## 2014-11-15 DIAGNOSIS — F4323 Adjustment disorder with mixed anxiety and depressed mood: Secondary | ICD-10-CM | POA: Diagnosis not present

## 2014-11-15 DIAGNOSIS — Z7952 Long term (current) use of systemic steroids: Secondary | ICD-10-CM | POA: Diagnosis not present

## 2014-11-15 DIAGNOSIS — R0602 Shortness of breath: Secondary | ICD-10-CM | POA: Diagnosis present

## 2014-11-15 DIAGNOSIS — Z9114 Patient's other noncompliance with medication regimen: Secondary | ICD-10-CM | POA: Diagnosis present

## 2014-11-15 LAB — COMPREHENSIVE METABOLIC PANEL
ALBUMIN: 4 g/dL (ref 3.5–5.0)
ALT: 11 U/L — ABNORMAL LOW (ref 14–54)
AST: 17 U/L (ref 15–41)
Alkaline Phosphatase: 90 U/L (ref 38–126)
Anion gap: 11 (ref 5–15)
BILIRUBIN TOTAL: 0.7 mg/dL (ref 0.3–1.2)
BUN: 13 mg/dL (ref 6–20)
CALCIUM: 9.1 mg/dL (ref 8.9–10.3)
CO2: 25 mmol/L (ref 22–32)
Chloride: 107 mmol/L (ref 101–111)
Creatinine, Ser: 1.03 mg/dL — ABNORMAL HIGH (ref 0.44–1.00)
GFR calc non Af Amer: >60 mL/min (ref >60)
GLUCOSE: 93 mg/dL (ref 65–99)
POTASSIUM: 3.7 mmol/L (ref 3.5–5.1)
SODIUM: 143 mmol/L (ref 135–145)
Total Protein: 7.2 g/dL (ref 6.5–8.1)

## 2014-11-15 LAB — URINALYSIS, ROUTINE W REFLEX MICROSCOPIC
Glucose, UA: NEGATIVE mg/dL
Hgb urine dipstick: NEGATIVE
Ketones, ur: >80 mg/dL — AB
NITRITE: NEGATIVE
PH: 5 (ref 5.0–8.0)
Protein, ur: NEGATIVE mg/dL
Specific Gravity, Urine: 1.02 (ref 1.005–1.030)
Urobilinogen, UA: 0.2 mg/dL (ref 0.0–1.0)

## 2014-11-15 LAB — URINE MICROSCOPIC-ADD ON

## 2014-11-15 LAB — MRSA PCR SCREENING: MRSA by PCR: NEGATIVE

## 2014-11-15 LAB — TSH: TSH: 0.057 u[IU]/mL — ABNORMAL LOW (ref 0.350–4.500)

## 2014-11-15 LAB — TROPONIN I: Troponin I: <0.03 ng/mL (ref <0.031)

## 2014-11-15 LAB — CBC
HCT: 36 % (ref 36.0–46.0)
HEMOGLOBIN: 11.8 g/dL — AB (ref 12.0–15.0)
MCH: 26.5 pg (ref 26.0–34.0)
MCHC: 32.8 g/dL (ref 30.0–36.0)
MCV: 80.7 fL (ref 78.0–100.0)
PLATELETS: 284 10*3/uL (ref 150–400)
RBC: 4.46 MIL/uL (ref 3.87–5.11)
RDW: 13.6 % (ref 11.5–15.5)
WBC: 8.8 10*3/uL (ref 4.0–10.5)

## 2014-11-15 LAB — PHOSPHORUS: PHOSPHORUS: 3.6 mg/dL (ref 2.5–4.6)

## 2014-11-15 LAB — MAGNESIUM: MAGNESIUM: 2.2 mg/dL (ref 1.7–2.4)

## 2014-11-15 MED ORDER — SODIUM CHLORIDE 0.9 % IJ SOLN
3.0000 mL | Freq: Two times a day (BID) | INTRAMUSCULAR | Status: DC
  Administered 2014-11-15 – 2014-11-20 (×10): 3 mL via INTRAVENOUS

## 2014-11-15 MED ORDER — ONDANSETRON HCL 4 MG PO TABS
4.0000 mg | ORAL_TABLET | Freq: Four times a day (QID) | ORAL | Status: DC | PRN
  Administered 2014-11-16 – 2014-11-20 (×2): 4 mg via ORAL
  Filled 2014-11-15 (×2): qty 1

## 2014-11-15 MED ORDER — TIZANIDINE HCL 2 MG PO TABS
6.0000 mg | ORAL_TABLET | Freq: Three times a day (TID) | ORAL | Status: DC | PRN
  Administered 2014-11-15 – 2014-11-20 (×10): 6 mg via ORAL
  Filled 2014-11-15: qty 1
  Filled 2014-11-15 (×8): qty 3
  Filled 2014-11-15: qty 1
  Filled 2014-11-15 (×2): qty 3

## 2014-11-15 MED ORDER — LORAZEPAM 2 MG/ML IJ SOLN
1.0000 mg | INTRAMUSCULAR | Status: DC | PRN
  Administered 2014-11-15: 1 mg via INTRAVENOUS
  Filled 2014-11-15: qty 1

## 2014-11-15 MED ORDER — TECHNETIUM TC 99M DIETHYLENETRIAME-PENTAACETIC ACID: 40.0000 | Freq: Once | INTRAVENOUS | Status: AC | PRN

## 2014-11-15 MED ORDER — LORAZEPAM 2 MG/ML IJ SOLN
1.0000 mg | Freq: Once | INTRAMUSCULAR | Status: AC
  Administered 2014-11-15: 1 mg via INTRAVENOUS
  Filled 2014-11-15: qty 1

## 2014-11-15 MED ORDER — SODIUM CHLORIDE 0.9 % IV SOLN
INTRAVENOUS | Status: AC
  Administered 2014-11-15: 05:00:00 via INTRAVENOUS

## 2014-11-15 MED ORDER — ONDANSETRON HCL 4 MG/2ML IJ SOLN
4.0000 mg | Freq: Four times a day (QID) | INTRAMUSCULAR | Status: DC | PRN
  Administered 2014-11-15 – 2014-11-20 (×6): 4 mg via INTRAVENOUS
  Filled 2014-11-15 (×7): qty 2

## 2014-11-15 MED ORDER — CYCLOBENZAPRINE HCL 10 MG PO TABS
10.0000 mg | ORAL_TABLET | Freq: Once | ORAL | Status: DC
  Filled 2014-11-15: qty 1

## 2014-11-15 MED ORDER — PANTOPRAZOLE SODIUM 40 MG IV SOLR
40.0000 mg | Freq: Every day | INTRAVENOUS | Status: DC
  Administered 2014-11-15: 40 mg via INTRAVENOUS
  Filled 2014-11-15 (×2): qty 40

## 2014-11-15 MED ORDER — ENOXAPARIN SODIUM 40 MG/0.4ML ~~LOC~~ SOLN
40.0000 mg | Freq: Every day | SUBCUTANEOUS | Status: DC
  Administered 2014-11-15 – 2014-11-20 (×6): 40 mg via SUBCUTANEOUS
  Filled 2014-11-15 (×6): qty 0.4

## 2014-11-15 MED ORDER — ALPRAZOLAM 0.5 MG PO TABS
1.0000 mg | ORAL_TABLET | Freq: Three times a day (TID) | ORAL | Status: DC | PRN
  Administered 2014-11-15 – 2014-11-20 (×11): 1 mg via ORAL
  Filled 2014-11-15 (×12): qty 2

## 2014-11-15 MED ORDER — MIRTAZAPINE 15 MG PO TABS
15.0000 mg | ORAL_TABLET | Freq: Every day | ORAL | Status: DC
  Administered 2014-11-15 – 2014-11-19 (×5): 15 mg via ORAL
  Filled 2014-11-15 (×8): qty 1

## 2014-11-15 MED ORDER — OLOPATADINE HCL 0.1 % OP SOLN
1.0000 [drp] | Freq: Two times a day (BID) | OPHTHALMIC | Status: DC
  Administered 2014-11-15 – 2014-11-20 (×11): 1 [drp] via OPHTHALMIC
  Filled 2014-11-15 (×2): qty 5

## 2014-11-15 MED ORDER — TIZANIDINE HCL 4 MG PO TABS
8.0000 mg | ORAL_TABLET | Freq: Three times a day (TID) | ORAL | Status: DC | PRN
  Filled 2014-11-15: qty 2

## 2014-11-15 MED ORDER — CETYLPYRIDINIUM CHLORIDE 0.05 % MT LIQD
7.0000 mL | Freq: Two times a day (BID) | OROMUCOSAL | Status: DC
  Administered 2014-11-15 – 2014-11-17 (×5): 7 mL via OROMUCOSAL

## 2014-11-15 MED ORDER — GABAPENTIN 300 MG PO CAPS
300.0000 mg | ORAL_CAPSULE | Freq: Three times a day (TID) | ORAL | Status: DC
  Filled 2014-11-15 (×4): qty 1

## 2014-11-15 MED ORDER — ACETAMINOPHEN 325 MG PO TABS
650.0000 mg | ORAL_TABLET | Freq: Four times a day (QID) | ORAL | Status: DC | PRN
  Administered 2014-11-17 – 2014-11-18 (×2): 650 mg via ORAL
  Filled 2014-11-15 (×2): qty 2

## 2014-11-15 MED ORDER — ACETAMINOPHEN 650 MG RE SUPP
650.0000 mg | Freq: Four times a day (QID) | RECTAL | Status: DC | PRN
  Administered 2014-11-15: 650 mg via RECTAL
  Filled 2014-11-15: qty 1

## 2014-11-15 MED ORDER — GABAPENTIN 100 MG PO CAPS
100.0000 mg | ORAL_CAPSULE | Freq: Three times a day (TID) | ORAL | Status: DC
  Administered 2014-11-15 – 2014-11-20 (×15): 100 mg via ORAL
  Filled 2014-11-15 (×16): qty 1

## 2014-11-15 MED ORDER — DESMOPRESSIN ACE SPRAY REFRIG 0.01 % NA SOLN
1.0000 | Freq: Two times a day (BID) | NASAL | Status: DC
  Administered 2014-11-15 – 2014-11-20 (×11): 1 via NASAL
  Filled 2014-11-15 (×3): qty 5

## 2014-11-15 MED ORDER — KETOROLAC TROMETHAMINE 30 MG/ML IJ SOLN
30.0000 mg | Freq: Once | INTRAMUSCULAR | Status: AC
  Administered 2014-11-15: 30 mg via INTRAVENOUS
  Filled 2014-11-15 (×2): qty 1

## 2014-11-15 MED ORDER — SODIUM CHLORIDE 0.9 % IV SOLN
INTRAVENOUS | Status: AC
  Administered 2014-11-15: 03:00:00 via INTRAVENOUS

## 2014-11-15 MED ORDER — LEVOTHYROXINE SODIUM 100 MCG IV SOLR
50.0000 ug | Freq: Every day | INTRAVENOUS | Status: DC
Start: 2014-11-15 — End: 2014-11-16
  Administered 2014-11-15 – 2014-11-16 (×2): 50 ug via INTRAVENOUS
  Filled 2014-11-15 (×2): qty 5

## 2014-11-15 MED ORDER — TECHNETIUM TO 99M ALBUMIN AGGREGATED
6.0000 | Freq: Once | INTRAVENOUS | Status: AC | PRN
  Administered 2014-11-15: 6 via INTRAVENOUS

## 2014-11-15 MED ORDER — SODIUM CHLORIDE 0.9 % IV SOLN
1000.0000 mg | Freq: Two times a day (BID) | INTRAVENOUS | Status: DC
  Administered 2014-11-15 (×2): 1000 mg via INTRAVENOUS
  Filled 2014-11-15 (×5): qty 10

## 2014-11-15 MED ORDER — HYDROCORTISONE NA SUCCINATE PF 100 MG IJ SOLR
100.0000 mg | Freq: Four times a day (QID) | INTRAMUSCULAR | Status: DC
  Administered 2014-11-15 – 2014-11-16 (×5): 100 mg via INTRAVENOUS
  Filled 2014-11-15 (×9): qty 2

## 2014-11-15 NOTE — Care Management Note (Signed)
Case Management Note  Patient Details  Name: Shelly Le MRN: [id number] Date of Birth: [birth year]  Subjective/Objective:    Adm w addison dis, seizures                Action/Plan: lives at home ,pcp dr Gordan Payment   Expected Discharge Date:                  Expected Discharge Plan:     In-House Referral:     Discharge planning Services     Post Acute Care Choice:    Choice offered to:     DME Arranged:    DME Agency:     HH Arranged:    [place] Agency:     Status of Service:     Medicare Important Message Given:    Date Medicare IM Given:    Medicare IM give by:    Date Additional Medicare IM Given:    Additional Medicare Important Message give by:     If discussed at [place] of Stay Meetings, dates discussed:    Additional Comments: ur review done  Lacretia Leigh, RN 11/15/2014, 9:14 AM

## 2014-11-15 NOTE — Procedures (Signed)
ELECTROENCEPHALOGRAM REPORT   Patient: Shelly Le      Room #: 2H-28 Age: 54 y.o.        Sex: female Referring Physician: Dr Sherral Hammers [facility] Report Date:  11/15/2014        Interpreting Physician: Hulen Luster  History: [name] is an 54 y.o. female admitted with seizure vs pseudoseizure  Medications:  Scheduled: . antiseptic oral rinse  7 mL Mouth Rinse BID  . desmopressin  1 spray Nasal BID  . enoxaparin (LOVENOX) injection  40 mg Subcutaneous Daily  . gabapentin  300 mg Oral TID  . hydrocortisone sod succinate (SOLU-CORTEF) inj  100 mg Intravenous 4 times per day  . levETIRAcetam  1,000 mg Intravenous Q12H  . levothyroxine  50 mcg Intravenous Daily  . mirtazapine  15 mg Oral QHS  . olopatadine  1 drop Both Eyes BID  . pantoprazole (PROTONIX) IV  40 mg Intravenous QHS  . sodium chloride  3 mL Intravenous Q12H   Continuous:   Conditions of Recording:  This is a 16 channel EEG carried out with the patient in the drowsy state.  Description:  The waking background activity consists of a low voltage, symmetrical, fairly well organized, 8-10 Hz alpha activity, seen from the parieto-occipital and posterior temporal regions.  Low voltage fast activity, poorly organized, is seen anteriorly and is at times superimposed on more posterior regions.  A mixture of theta and alpha rhythms are seen from the central and temporal regions. No focal slowing or epileptiform activity is noted.   The patient drowses with slowing to irregular, low voltage theta and beta activity. The patient goes in to a light sleep with symmetrical sleep spindles, vertex central sharp transients and irregular slow activity.  Hyperventilation was not performed. Intermittent photic stimulation was performed but failed to illicit any change in the tracing.     IMPRESSION: Normal electroencephalogram, awake, asleep and with activation procedures. There are no focal lateralizing or epileptiform  features.   Jim Like, DO [facility] [phone]  If 7pm- 7am, please page neurology on call as listed in [place]. 11/15/2014, 2:46 PM

## 2014-11-15 NOTE — Progress Notes (Signed)
EEG Completed; Results Pending  

## 2014-11-15 NOTE — ED Notes (Signed)
Called to patient bedside: patient currently reporting shortness of breath, chest burning, and throat/neck discomfort. MD Riester informed and at bedside to see.

## 2014-11-15 NOTE — Consult Note (Signed)
NEURO HOSPITALIST CONSULT NOTE   Referring physician: Dr Marvis Repress Reason for Consult: status epilepticus versus pseudoseizures  HPI:                                                                                                                                          Shelly Le is an 54 y.o. 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 While in the ED patient reported frequent feelings that she was about to have a seizure and she actually sustained 3 rather prolonged generalized seizures before arriving to the floor. She then started having seizures again witnessed by the nursing staff and thus a neurology consultation for requested to further address patient's paroxysmal events. She had already received IV ativan without successful termination of the seizures. When I got to the bedside, patient was having generalized but non violent jerking movements that rapidly escalated and became violent movements with back archness, mumbling, and eyes definitely closed. At some point during the episode I grabbed her head and she softly said that the back of her neck was hurting and she was not feeling well. Then, she was able to have a conversation with me, saying that she missed her seizure medication for few days because she couldn't swallow.    Past Medical History  Diagnosis Date  . Addison's disease   . Diabetes insipidus   . Pneumonia   . Seizures   . Brain tumor      Past Surgical History  Procedure Laterality Date  . Tonsillectomy    . Abdominal hysterectomy    . Brain surgery      crainotomy  . Peg tube placement  2012    had it removed 2012  . Esophagogastroduodenoscopy N/A 06/21/2014    Procedure: ESOPHAGOGASTRODUODENOSCOPY (EGD);  Surgeon: Winfield Cunas., MD;  Location[facility] ENDOSCOPY;  Service: Endoscopy;  Laterality: N/A;    History reviewed. No pertinent family history.  Family History:no brain tumors, epilepsy, or brain aneurysm.   Social History:  reports that she has never smoked. She has never used smokeless tobacco. She reports that she does not drink alcohol or use illicit drugs.  Allergies  Allergen Reactions  . Contrast Media [Iodinated Diagnostic Agents] Shortness Of Breath and Rash  . Fentanyl Other (See Comments)    Witnessed grand tonic clonic seizure in ED  while wearing patch (02/26/14); h/o seizure disorder (pt states she still uses patches as needed for pain)  . Ioxaglate Hives, Shortness Of Breath and Rash  . Morphine Hives and Shortness Of Breath  . Shellfish Allergy Anaphylaxis and Hives  . Penicillins Nausea And Vomiting    GI Intolerance  . Latex Rash  . Metrizamide Rash    MEDICATIONS:                                                                                                                     Scheduled: . sodium chloride   Intravenous STAT  . desmopressin  1 spray Nasal BID  . enoxaparin (LOVENOX) injection  40 mg Subcutaneous Daily  . gabapentin  300 mg Oral TID  . hydrocortisone sod succinate (SOLU-CORTEF) inj  100 mg Intravenous 4 times per day  . levETIRAcetam  1,000 mg Intravenous Q12H  . levothyroxine  50 mcg Intravenous Daily  . LORazepam  1 mg Intravenous Once  . mirtazapine  15 mg Oral QHS  . olopatadine  1 drop Both Eyes BID  . pantoprazole (PROTONIX) IV  40 mg Intravenous QHS  . sodium chloride  3 mL Intravenous Q12H     ROS: unable to obtain due to mental status                                                                                                                                       History obtained from chart review  Blood pressure 121/60, pulse 112, temperature 99.1 F (37.3 C), temperature source Rectal, resp. rate 25, SpO2 99 %. Physical exam: pleasant, able to converse.  Head: normocephalic. Neck: supple, no bruits, no JVD. Cardiac: no murmurs. Lungs: clear. Abdomen: soft, no tender, no mass. Extremities: no edema. Skin: no rash  Neurologic Examination:                                                                                                      General: Mental Status: Open  her eyes, follows simple commands. Speech fluent without evidence of aphasia.  Cranial Nerves: II: Discs flat bilaterally; Visual fields grossly normal, pupils equal, round, reactive to light and accommodation III,IV, VI: ptosis not present, extra-ocular motions intact bilaterally V,VII: smile symmetric, facial light touch sensation can not be reliably assessed.  VIII: hearing grossly normal bilaterally IX,X: gag reflex present XI: bilateral shoulder shrug no tested XII: midline tongue extension without atrophy or fasciculations  Motor: Moves all extremities spontaneously and symmetrically. Tone and bulk:normal tone throughout; no atrophy noted Sensory: Pinprick and light touch intact throughout, bilaterally Deep Tendon Reflexes:  1+ all over Plantars: Right: downgoingLeft: downgoing Cerebellar: normal finger-to-nose, normal heel-to-shin test Gait:  No tested due to safety reasons. CV: pulses palpable throughout   No results found for: CHOL  Results for orders placed or performed during the hospital encounter of 11/14/14 (from the past 48 hour(s))  Basic metabolic panel     Status: Abnormal   Collection Time: 11/14/14  8:15 PM  Result Value Ref Range   Sodium 139 135 - 145 mmol/L   Potassium 4.1 3.5 - 5.1 mmol/L    Chloride 104 101 - 111 mmol/L   CO2 20 (L) 22 - 32 mmol/L   Glucose, Bld 122 (H) 65 - 99 mg/dL   BUN 12 6 - 20 mg/dL   Creatinine, Ser 1.36 (H) 0.44 - 1.00 mg/dL   Calcium 9.6 8.9 - 10.3 mg/dL   GFR calc non Af Amer 43 (L) >60 mL/min   GFR calc Af Amer 50 (L) >60 mL/min    Comment: (NOTE) The eGFR has been calculated using the CKD EPI equation. This calculation has not been validated in all clinical situations. eGFR's persistently <60 mL/min signify possible Chronic Kidney Disease.    Anion gap 15 5 - 15  CBC     Status: Abnormal   Collection Time: 11/14/14  8:15 PM  Result Value Ref Range   WBC 11.2 (H) 4.0 - 10.5 K/uL   RBC 5.06 3.87 - 5.11 MIL/uL   Hemoglobin 13.5 12.0 - 15.0 g/dL   HCT 40.4 36.0 - 46.0 %   MCV 79.8 78.0 - 100.0 fL   MCH 26.7 26.0 - 34.0 pg   MCHC 33.4 30.0 - 36.0 g/dL   RDW 13.6 11.5 - 15.5 %   Platelets 361 150 - 400 K/uL  I-stat troponin, ED     Status: None   Collection Time: 11/14/14  8:31 PM  Result Value Ref Range   Troponin i, poc 0.00 0.00 - 0.08 ng/mL   Comment 3            Comment: Due to the release kinetics of cTnI, a negative result within the first hours of the onset of symptoms does not rule out myocardial infarction with certainty. If myocardial infarction is still suspected, repeat the test at appropriate intervals.   I-Stat CG4 Lactic Acid, ED     Status: None   Collection Time: 11/14/14 11:27 PM  Result Value Ref Range   Lactic Acid, Venous 1.24 0.5 - 2.0 mmol/L    Dg Chest 2 View  11/14/2014   CLINICAL DATA:  Chest pain and shortness of breath for 2 days  EXAM: CHEST - 2 VIEW  COMPARISON:  08/04/2014  FINDINGS: The heart size and mediastinal contours are within normal limits. Both lungs are clear. The visualized skeletal structures are unremarkable.  IMPRESSION: No active disease.   Electronically Signed   By: Linus Mako.D.  On: 11/14/2014 20:51   Ct Soft Tissue Neck Wo Contrast  11/14/2014   CLINICAL DATA:  54 year old  female with bilateral anterior neck pain  EXAM: CT NECK WITHOUT CONTRAST  TECHNIQUE: Multidetector CT imaging of the neck was performed following the standard protocol without intravenous contrast.  COMPARISON:  Head CT dated 08/30/2014  FINDINGS: Evaluation of this exam is limited in the absence of intravenous contrast.  The visualized upper lungs appear unremarkable. The central airways are patent. The visualized paranasal sinuses and mastoid air cells are clear. Focal right temporal craniectomy defect noted. The visualized brain appears unremarkable. There is degenerative changes of the spine most prominent at C4-C6. No acute fracture identified. The parotid, and submandibular glands appear unremarkable. The thyroid gland is small. There is no lymphadenopathy. The soft tissues are unremarkable. No drainable fluid collection identified. A left-sided aortic arch with aberrant right subclavian artery anatomy is noted.  IMPRESSION: No acute findings.   Electronically Signed   By: Anner Crete M.D.   On: 11/14/2014 23:22   Assessment/Plan:  54 y.o. 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 She reportedly started having GTC seizures after brain surgery for a pituitary adenoma, thus can not entirely exclude a mixed type seizure disorder. Continue keppra. EEG to try to capture current events, but ultimately will need admission to an epilepsy unit for video-EEG monitoring in order to better classify patient paroxysmal events. Will follow up.  Dorian Pod, MD 11/15/2014, 3:46 AM  [facility]

## 2014-11-15 NOTE — Progress Notes (Signed)
Callled per floor Charge RN at 0230 for newly admitted Pt from ED have active seizures on ED stretcher. Upon my arrival at 0235 Pt found lying on her side, having generalized shaking of head neck and hips. Unresponsive to voice, withdraws to tactile stimulation in leg and moans. Eyes closed tightly. VSS, hr 100-110, BP 133/86, po2 98% on 2 lNC. Pt assisted to 3E bed and shaking paused for tranfer then started again. Dr. Roel Cluck, admitting MD paged, call back received and MD updated on Pt status by myself. Neurology consult placed per MD. Pt kept lying on her side and observed closely for about 40 minutes, shaking continued but VS unchanged, airway not compromised at all. Dr. Armida Sans from Neurology to bedside at  0315 to assess Pt. See Dr. Gertha Calkin note for details. Per MD Pt was able to have a conversation with him. Orders obtained per Dr. Roel Cluck for transfer to SDU for closer monitoring. Bed placement notified and awaiting bed. Pt left resting in bed, VSS with consistent non violent minor shaking of head and neck.

## 2014-11-15 NOTE — Progress Notes (Signed)
Received pt from the ED via stretcher and in the process of a seizure activity. Pt had her eyes closed, was not responding to any commands. Whole body was shaking during the activity, which lasted for 13 minutes. This followed with tremors, which also lasted for a longer period of time. Rapid RN was called who came up and attended to the pt. From 0214 to 0319, pt had tremors and questionable seizures. Pt was able to open her eyes and able to speak some words around 0323. Dr Armida Sans also came to see pt. Pt maintained O2 sats in the mid 90s during the seizure activity. V/S were WNL. Rectal temp was 99.1. Pt transferred to a step down unit. Report given and pt moved to [facility] around 0418.

## 2014-11-15 NOTE — ED Provider Notes (Signed)
CSN: [id number]     Arrival date & time 11/14/14  1944 History   First MD Initiated Contact with Patient 11/14/14 2038     Chief Complaint  Patient presents with  . Chest Pain  . Shortness of Breath   (Consider location/radiation/quality/duration/timing/severity/associated sxs/prior Treatment) HPI Patient is a 54 year old African-American female with a history of Addison's disease, diabetes insipidus, seizures, brain tumor status post resection presented today for increasing anterior neck pain chest tightness, hoarse voice, and difficulty swallowing for the past 4 days. On arrival to the emergency department she was hemodynamically stable and in no acute distress. All waiting to see provider however she began having seizure-like activity. After result was able to obtain slight history. Patient has had increasing headache but no frank neck stiffness. She denies any fevers chills nausea or vomiting. Neck pain bilaterally and hoarse voice has been increasing. No alleviating or aggravating factors. This has limited her ability to take medications and has been off her seizure medications.  Past Medical History  Diagnosis Date  . Addison's disease   . Diabetes insipidus   . Pneumonia   . Seizures   . Brain tumor    Past Surgical History  Procedure Laterality Date  . Tonsillectomy    . Abdominal hysterectomy    . Brain surgery      crainotomy  . Peg tube placement  2012    had it removed 2012  . Esophagogastroduodenoscopy N/A 06/21/2014    Procedure: ESOPHAGOGASTRODUODENOSCOPY (EGD);  Surgeon: Winfield Cunas., MD;  Location: [facility] ENDOSCOPY;  Service: Endoscopy;  Laterality: N/A;   No family history on file. History  Substance Use Topics  . Smoking status: Never Smoker   . Smokeless tobacco: Never Used  . Alcohol Use: No   OB History    No data available     Review of Systems  Constitutional: Negative for fever and chills.  HENT: Positive for sore throat, trouble swallowing and  voice change. Negative for congestion.   Eyes: Negative for pain.  Respiratory: Positive for chest tightness. Negative for cough and shortness of breath.   Cardiovascular: Negative for chest pain and palpitations.  Gastrointestinal: Negative for nausea, vomiting, abdominal pain and diarrhea.  Genitourinary: Negative for dysuria and flank pain.  Musculoskeletal: Positive for neck pain. Negative for back pain.  Skin: Negative for rash.  Allergic/Immunologic: Negative.   Neurological: Positive for tremors, seizures and headaches. Negative for dizziness and light-headedness.  Psychiatric/Behavioral: Negative for confusion.      Allergies  Contrast media; Fentanyl; Morphine; Shellfish allergy; Penicillins; Latex; and Metrizamide  Home Medications   Prior to Admission medications   Medication Sig Start Date End Date Taking? Authorizing Provider  ALPRAZolam Duanne Moron) 1 MG tablet Take 1 mg by mouth 3 (three) times daily as needed for anxiety.     Historical Provider, MD  azelastine (OPTIVAR) 0.05 % ophthalmic solution Place 1 drop into both eyes at bedtime.     Historical Provider, MD  desmopressin (DDAVP) 0.01 % SOLN Place 1 spray into the nose 2 (two) times daily. 1 spray - 10 mcg 05/09/14   Historical Provider, MD  fentaNYL (DURAGESIC - DOSED MCG/HR) 50 MCG/HR Place 1 patch (50 mcg total) onto the skin every 3 (three) days. 06/24/14   Orson Eva, MD  gabapentin (NEURONTIN) 300 MG capsule Take 300 mg by mouth 3 (three) times daily.    Historical Provider, MD  HYDROcodone-acetaminophen (NORCO/VICODIN) 5-325 MG per tablet Take 1 tablet by mouth every 6 (six) hours  as needed for moderate pain. 06/24/14   Orson Eva, MD  hydrocortisone (CORTEF) 10 MG tablet Take 2.5 tablets (25 mg total) by mouth every 12 (twelve) hours. X 2 days. Then 2 tabs (20mg ) twice a day x 2 days, then go back to usual dose (20mg  in am, 10mg  in pm) 06/24/14   Orson Eva, MD  hydrocortisone (CORTEF) 20 MG tablet Take 10-60 mg by mouth  daily. Take 1 tablet (20 mg) every morning and 1/2 tablet (10 mg) every evening; may double or triple the dose in the event of stress or acute illness 06/06/14   Historical Provider, MD  levETIRAcetam (KEPPRA) 1000 MG tablet Take 1,000 mg by mouth 2 (two) times daily.    Historical Provider, MD  levothyroxine (SYNTHROID, LEVOTHROID) 100 MCG tablet Take 100 mcg by mouth daily before breakfast.    Historical Provider, MD  methocarbamol (ROBAXIN) 500 MG tablet Take 1 tablet (500 mg total) by mouth 2 (two) times daily. Patient not taking: Reported on 06/11/2014 04/12/14   Alvina Chou, PA-C  mirtazapine (REMERON) 15 MG tablet Take 15 mg by mouth at bedtime.    Historical Provider, MD  oxyCODONE (ROXICODONE) 15 MG immediate release tablet Take 15 mg by mouth 3 (three) times daily as needed for pain.    Historical Provider, MD  pantoprazole (PROTONIX) 40 MG tablet Take 1 tablet (40 mg total) by mouth 2 (two) times daily. 06/24/14   Orson Eva, MD  potassium chloride (MICRO-K) 10 MEQ CR capsule Take 10 mEq by mouth 2 (two) times daily.    Historical Provider, MD  promethazine (PHENERGAN) 25 MG tablet Take 25 mg by mouth every 6 (six) hours as needed for nausea or vomiting.     Historical Provider, MD  sucralfate (CARAFATE) 1 G tablet Take 1 tablet (1 g total) by mouth 4 (four) times daily -  with meals and at bedtime. 06/24/14   Orson Eva, MD  tiZANidine (ZANAFLEX) 4 MG tablet Take 8 mg by mouth every 8 (eight) hours as needed for muscle spasms.    Historical Provider, MD   BP 125/62 mmHg  Pulse 98  Temp(Src) 98.3 F (36.8 C) (Oral)  Resp 30  SpO2 99% Physical Exam  Constitutional: She is oriented to person, place, and time. She appears well-developed and well-nourished. She appears distressed.  Initially seizing but resoloved and exam repreformed.   HENT:  Head: Normocephalic and atraumatic.  Eyes: Conjunctivae and EOM are normal. Pupils are equal, round, and reactive to light.  Neck: Normal range  of motion. Neck supple.  Cardiovascular: Normal rate, regular rhythm and normal heart sounds.   Pulmonary/Chest: Effort normal and breath sounds normal. No respiratory distress.  Abdominal: Soft. Bowel sounds are normal. There is no tenderness.  Musculoskeletal: Normal range of motion.  Neurological: She is alert and oriented to person, place, and time. She has normal strength and normal reflexes. No cranial nerve deficit or sensory deficit.  Normal finger to nose bilaterally.  Rapid alternating movements intact bilaterally.  Normal heal to shin bilaterally.   No pronator drift bilaterally.    Skin: Skin is warm and dry. She is not diaphoretic.  Psychiatric: She has a normal mood and affect.    ED Course  Procedures (including critical care time) Labs Review Labs Reviewed  BASIC METABOLIC PANEL - Abnormal; Notable for the following:    CO2 20 (*)    Glucose, Bld 122 (*)    Creatinine, Ser 1.36 (*)    GFR calc non  Af Amer 43 (*)    GFR calc Af Amer 50 (*)    All other components within normal limits  CBC - Abnormal; Notable for the following:    WBC 11.2 (*)    All other components within normal limits  URINE CULTURE  URINALYSIS, ROUTINE W REFLEX MICROSCOPIC (NOT AT [facility])  TROPONIN I  COMPREHENSIVE METABOLIC PANEL  I-STAT TROPOININ, ED  I-STAT CG4 LACTIC ACID, ED  I-STAT CG4 LACTIC ACID, ED    Imaging Review Dg Chest 2 View  11/14/2014   CLINICAL DATA:  Chest pain and shortness of breath for 2 days  EXAM: CHEST - 2 VIEW  COMPARISON:  08/04/2014  FINDINGS: The heart size and mediastinal contours are within normal limits. Both lungs are clear. The visualized skeletal structures are unremarkable.  IMPRESSION: No active disease.   Electronically Signed   By: Inez Catalina M.D.   On: 11/14/2014 20:51   Ct Soft Tissue Neck Wo Contrast  11/14/2014   CLINICAL DATA:  54 year old female with bilateral anterior neck pain  EXAM: CT NECK WITHOUT CONTRAST  TECHNIQUE: Multidetector CT  imaging of the neck was performed following the standard protocol without intravenous contrast.  COMPARISON:  Head CT dated 08/30/2014  FINDINGS: Evaluation of this exam is limited in the absence of intravenous contrast.  The visualized upper lungs appear unremarkable. The central airways are patent. The visualized paranasal sinuses and mastoid air cells are clear. Focal right temporal craniectomy defect noted. The visualized brain appears unremarkable. There is degenerative changes of the spine most prominent at C4-C6. No acute fracture identified. The parotid, and submandibular glands appear unremarkable. The thyroid gland is small. There is no lymphadenopathy. The soft tissues are unremarkable. No drainable fluid collection identified. A left-sided aortic arch with aberrant right subclavian artery anatomy is noted.  IMPRESSION: No acute findings.   Electronically Signed   By: Anner Crete M.D.   On: 11/14/2014 23:22     EKG Interpretation   Date/Time:  Wednesday November 14 2014 19:59:28 EDT Ventricular Rate:  145 PR Interval:    QRS Duration: 92 QT Interval:  306 QTC Calculation: 475 R Axis:   51 Text Interpretation:  Supraventricular tachycardia ST \\T \ T wave  abnormality, consider inferior ischemia Abnormal ECG When compared with  ECG of 06/10/2014, HEART RATE has increased Confirmed by [facility]  MD, DAVID  ([id number]) on 11/14/2014 8:16:42 PM      MDM   Final diagnoses:  None   Patient is a 54 year old African-American female with a history of Addison's disease, diabetes insipidus, seizures, brain tumor status post resection presented today for increasing anterior neck pain chest tightness, hoarse voice, and difficulty swallowing for the past 4 days.   On arrival to the emergency department hemodynamic stable and in no acute distress. She was moderately tachycardic to the 130s however but had no hypotension or hypoxia. She began having seizure-like activity for which she was given Ativan and  a Keppra load. Subsequently resolved. She continued to have intermittent shaking movements but was distracted with pain.  Patient also able to speak during these episodes.  Unknown if true seizures or pseudoseizures.  Neuro exam with no focal deficits.  Doubt TIA or stroke.  Afebrile with no frank neck stiffness.  Doubt meningitis.   EKG showing sinus tachycardia with no ischemic changes. Patient given fluids with mild resolution of the tachycardia.  CT neck performed for possible deep space neck infection negative.  Cr elevated at 1.36 with only mild leukocytosis.  Unknown etiology of the pain at this time.  Doubt ACS, PE, dissection, PNA, or PTX.  Hospitalist Dr. Roel Cluck consulted who admitted pt for continued fluid therapy and eveluation.    If performed, labs, EKGs, and imaging were reviewed/interpreted by myself and my attending and incorporated into medical decision making.  Discussed pertinent finding with patient or caregiver prior to admission with no further questions.  Pt care supervised by my attending Dr. Francena Hanly, MD PGY-2  Emergency Medicine     Geronimo Boot, MD 38/18/40 [id number]  David Glick, MD 36/06/77 [id number]

## 2014-11-15 NOTE — Progress Notes (Signed)
Echocardiogram 2D Echocardiogram has been performed.  Tresa Res 11/15/2014, 10:44 AM

## 2014-11-15 NOTE — H&P (Signed)
PCP:  Daneil Dan, MD Leeroy Cha   Referring provider Resident   Chief Complaint:  Hurts to breath  HPI: Shelly Le is a 54 y.o. female   has a past medical history of Addison's disease; Diabetes insipidus; Pneumonia; Seizures; and Brain tumor.   Presented with Burning with swallowing and deep breaths. She is unable to take any medications or drink. Patient has history of seizure disorder and was not able to tolerate her kepra due to pain on swallowing. IN ER department patient had an episode which appeared to be seizure-like and was given IV Keppra. Given severity of neck pain and pain with swallowing a CT scan of soft tissues of the neck was obtained and was unremarkable. Plain film was unremarkable.   Per  review of records in February she was admitted and similar symptoms of burning with swallowing and was found to have gastric ulcers She has history of chronic pain syndrome. Patient has undergone numerous studies at the right of hospitals including [place] and here in  [place]. In February she had a nuclear medicine gastric emptying study done that showed mild gastroparesis. Her son was done showing minimal sludge in gallbladder. She undergone an EGD also in February that showed shallow ulcers is continued on PPI and Carafate but apparently no longer taking Carafate.   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 She was evaluated by psychiatry who recommended continuation of current medications. MRI of the brain at that time was unremarkable. EEG was wnl. Up on arrival to floor patient started to have seizure-like activity. She was evaluated by rapid response RN who noted the patient was able to moan with painful stimuli.   She reports that she was admitted to [facility] from May 11 -16 for nausea vomiting diarrhea and incontinence She  was diagnosed with Bacteria overgrowth. Patient was supposed to be started on rifampin but did not take it secondary to financial restraints. Patient did not follow-up because she states she was feeling too weak to see a doctor.   Hospitalist was called for admission for pain with breathing, seizure disorder  Review of Systems:    Pertinent positives include:  Fevers, chills, throat pain, seizure activity  Constitutional:  No weight loss, night sweats,, fatigue, weight loss  HEENT:  No headaches, Difficulty swallowing,Tooth/dental problems,Sore throat,  No sneezing, itching, ear ache, nasal congestion, post nasal drip,  Cardio-vascular:  No chest pain, Orthopnea, PND, anasarca, dizziness, palpitations.no Bilateral lower extremity swelling  GI:  No heartburn, indigestion, abdominal pain, nausea, vomiting, diarrhea, change in bowel habits, loss of appetite, melena, blood in stool, hematemesis Resp:  no shortness of breath at rest. No dyspnea on exertion, No excess mucus, no productive cough, No non-productive cough, No coughing up of blood.No change in color of mucus.No wheezing. Skin:  no rash or lesions. No jaundice GU:  no dysuria, change in color of urine, no urgency or frequency. No straining to urinate.  No flank pain.  Musculoskeletal:  No joint pain or no joint swelling. No decreased range of motion. No back pain.  Psych:  No change in mood or affect. No depression or anxiety. No memory loss.  Neuro: no localizing neurological complaints, no tingling, no weakness, no double vision, no gait abnormality, no slurred speech, no confusion  Otherwise ROS are negative except for above, 10 systems were reviewed  Past Medical  History: Past Medical History  Diagnosis Date  . Addison's disease   . Diabetes insipidus   . Pneumonia   . Seizures   . Brain tumor    Past Surgical History  Procedure Laterality Date  . Tonsillectomy    . Abdominal hysterectomy    . Brain surgery       crainotomy  . Peg tube placement  2012    had it removed 2012  . Esophagogastroduodenoscopy N/A 06/21/2014    Procedure: ESOPHAGOGASTRODUODENOSCOPY (EGD);  Surgeon: Winfield Cunas., MD;  Location: [name] ENDOSCOPY;  Service: Endoscopy;  Laterality: N/A;     Medications: Prior to Admission medications   Medication Sig Start Date End Date Taking? Authorizing Provider  ALPRAZolam Duanne Moron) 1 MG tablet Take 1 mg by mouth 3 (three) times daily as needed for anxiety.     Historical Provider, MD  azelastine (OPTIVAR) 0.05 % ophthalmic solution Place 1 drop into both eyes at bedtime.     Historical Provider, MD  desmopressin (DDAVP) 0.01 % SOLN Place 1 spray into the nose 2 (two) times daily. 1 spray - 10 mcg 05/09/14   Historical Provider, MD  fentaNYL (DURAGESIC - DOSED MCG/HR) 50 MCG/HR Place 1 patch (50 mcg total) onto the skin every 3 (three) days. 06/24/14   Orson Eva, MD  gabapentin (NEURONTIN) 300 MG capsule Take 300 mg by mouth 3 (three) times daily.    Historical Provider, MD  HYDROcodone-acetaminophen (NORCO/VICODIN) 5-325 MG per tablet Take 1 tablet by mouth every 6 (six) hours as needed for moderate pain. 06/24/14   Orson Eva, MD  hydrocortisone (CORTEF) 10 MG tablet Take 2.5 tablets (25 mg total) by mouth every 12 (twelve) hours. X 2 days. Then 2 tabs (20mg ) twice a day x 2 days, then go back to usual dose (20mg  in am, 10mg  in pm) 06/24/14   Orson Eva, MD  hydrocortisone (CORTEF) 20 MG tablet Take 10-60 mg by mouth daily. Take 1 tablet (20 mg) every morning and 1/2 tablet (10 mg) every evening; may double or triple the dose in the event of stress or acute illness 06/06/14   Historical Provider, MD  levETIRAcetam (KEPPRA) 1000 MG tablet Take 1,000 mg by mouth 2 (two) times daily.    Historical Provider, MD  levothyroxine (SYNTHROID, LEVOTHROID) 100 MCG tablet Take 100 mcg by mouth daily before breakfast.    Historical Provider, MD  methocarbamol (ROBAXIN) 500 MG tablet Take 1 tablet (500 mg total)  by mouth 2 (two) times daily. Patient not taking: Reported on 06/11/2014 04/12/14   Alvina Chou, PA-C  mirtazapine (REMERON) 15 MG tablet Take 15 mg by mouth at bedtime.    Historical Provider, MD  oxyCODONE (ROXICODONE) 15 MG immediate release tablet Take 15 mg by mouth 3 (three) times daily as needed for pain.    Historical Provider, MD  pantoprazole (PROTONIX) 40 MG tablet Take 1 tablet (40 mg total) by mouth 2 (two) times daily. 06/24/14   Orson Eva, MD  potassium chloride (MICRO-K) 10 MEQ CR capsule Take 10 mEq by mouth 2 (two) times daily.    Historical Provider, MD  promethazine (PHENERGAN) 25 MG tablet Take 25 mg by mouth every 6 (six) hours as needed for nausea or vomiting.     Historical Provider, MD  sucralfate (CARAFATE) 1 G tablet Take 1 tablet (1 g total) by mouth 4 (four) times daily -  with meals and at bedtime. 06/24/14   Orson Eva, MD  tiZANidine (ZANAFLEX) 4 MG tablet Take  8 mg by mouth every 8 (eight) hours as needed for muscle spasms.    Historical Provider, MD    Allergies:   Allergies  Allergen Reactions  . Contrast Media [Iodinated Diagnostic Agents] Shortness Of Breath and Rash  . Fentanyl Other (See Comments)    Witnessed grand tonic clonic seizure in ED while wearing patch (02/26/14); h/o seizure disorder (pt states she still uses patches as needed for pain)  . Morphine Hives and Shortness Of Breath  . Shellfish Allergy Anaphylaxis and Hives  . Penicillins Nausea And Vomiting    GI Intolerance  . Latex Rash  . Metrizamide Rash    Social History:  Ambulatory   independently recently have been having trouble walking Lives at home alone,       reports that she has never smoked. She has never used smokeless tobacco. She reports that she does not drink alcohol or use illicit drugs.    Family History: family history is not on file.    Physical Exam: Patient Vitals for the past 24 hrs:  BP Temp Temp src Pulse Resp SpO2  11/15/14 0030 127/71 mmHg - - 95  20 100 %  11/15/14 0013 - 98.3 F (36.8 C) Oral - - -  11/15/14 0000 101/56 mmHg - - 105 10 97 %  11/14/14 2330 119/67 mmHg - - 96 19 99 %  11/14/14 2300 127/73 mmHg - - 106 20 98 %  11/14/14 2230 99/65 mmHg - - 92 22 100 %  11/14/14 2215 125/76 mmHg - - 103 11 100 %  11/14/14 2207 105/81 mmHg - - 120 26 99 %  11/14/14 2200 105/81 mmHg - - 105 12 100 %  11/14/14 2145 118/78 mmHg - - 106 17 100 %  11/14/14 2134 134/74 mmHg - - 103 16 100 %  11/14/14 2115 116/72 mmHg - - 112 15 99 %  11/14/14 2100 138/86 mmHg - - (!) 136 - 98 %  11/14/14 2051 136/87 mmHg - - (!) 155 (!) 27 98 %  11/14/14 2030 - - - 68 19 99 %  11/14/14 2001 139/84 mmHg 98.7 F (37.1 C) Oral (!) 131 26 96 %    1. General:  tearful 2. Psychological: Alert and   Oriented 3. Head/ENT:    Dry Mucous Membranes                          Head Non traumatic, neck supple                          Normal   Dentition 4. SKIN:   decreased Skin turgor,  Skin clean Dry and intact no rash 5. Heart: Regular rate and rhythm no Murmur, Rub or gallop 6. Lungs: Clear to auscultation bilaterally, no wheezes or crackles   7. Abdomen: Soft, non-tender, Non distended 8. Lower extremities: no clubbing, cyanosis, or edema 9. Neurologically Grossly intact, moving all 4 extremities equally neurologically intact moving all 4 extremities. Symptoms resolved during the conversation if redirected 10. MSK: Normal range of motion  body mass index is unknown because there is no weight on file.   Labs on Admission:   Results for orders placed or performed during the hospital encounter of 11/14/14 (from the past 24 hour(s))  Basic metabolic panel     Status: Abnormal   Collection Time: 11/14/14  8:15 PM  Result Value Ref Range   Sodium 139 135 -  145 mmol/L   Potassium 4.1 3.5 - 5.1 mmol/L   Chloride 104 101 - 111 mmol/L   CO2 20 (L) 22 - 32 mmol/L   Glucose, Bld 122 (H) 65 - 99 mg/dL   BUN 12 6 - 20 mg/dL   Creatinine, Ser 1.36 (H) 0.44 - 1.00  mg/dL   Calcium 9.6 8.9 - 10.3 mg/dL   GFR calc non Af Amer 43 (L) >60 mL/min   GFR calc Af Amer 50 (L) >60 mL/min   Anion gap 15 5 - 15  CBC     Status: Abnormal   Collection Time: 11/14/14  8:15 PM  Result Value Ref Range   WBC 11.2 (H) 4.0 - 10.5 K/uL   RBC 5.06 3.87 - 5.11 MIL/uL   Hemoglobin 13.5 12.0 - 15.0 g/dL   HCT 40.4 36.0 - 46.0 %   MCV 79.8 78.0 - 100.0 fL   MCH 26.7 26.0 - 34.0 pg   MCHC 33.4 30.0 - 36.0 g/dL   RDW 13.6 11.5 - 15.5 %   Platelets 361 150 - 400 K/uL  I-stat troponin, ED     Status: None   Collection Time: 11/14/14  8:31 PM  Result Value Ref Range   Troponin i, poc 0.00 0.00 - 0.08 ng/mL   Comment 3          I-Stat CG4 Lactic Acid, ED     Status: None   Collection Time: 11/14/14 11:27 PM  Result Value Ref Range   Lactic Acid, Venous 1.24 0.5 - 2.0 mmol/L    UA ordered  No results found for: HGBA1C  CrCl cannot be calculated (Unknown ideal weight.).  BNP (last 3 results)  Recent Labs  02/21/14 1157  PROBNP 566.6*    Other results:  I have pearsonaly reviewed this: ECG REPORT  Rate: 145  Rhythm: posible SVT  ST&T Change: ST depression lateral leads   There were no vitals filed for this visit.   Cultures:    Component Value Date/Time   SDES BLOOD LEFT HAND 06/10/2014 1350   SPECREQUEST BOTTLES DRAWN AEROBIC ONLY 5 CC 06/10/2014 1350   CULT  06/10/2014 1350    NO GROWTH 5 DAYS Performed at [place] 06/17/2014 FINAL 06/10/2014 1350     Radiological Exams on Admission: Dg Chest 2 View  11/14/2014   CLINICAL DATA:  Chest pain and shortness of breath for 2 days  EXAM: CHEST - 2 VIEW  COMPARISON:  08/04/2014  FINDINGS: The heart size and mediastinal contours are within normal limits. Both lungs are clear. The visualized skeletal structures are unremarkable.  IMPRESSION: No active disease.   Electronically Signed   By: Inez Catalina M.D.   On: 11/14/2014 20:51   Ct Soft Tissue Neck Wo  Contrast  11/14/2014   CLINICAL DATA:  54 year old female with bilateral anterior neck pain  EXAM: CT NECK WITHOUT CONTRAST  TECHNIQUE: Multidetector CT imaging of the neck was performed following the standard protocol without intravenous contrast.  COMPARISON:  Head CT dated 08/30/2014  FINDINGS: Evaluation of this exam is limited in the absence of intravenous contrast.  The visualized upper lungs appear unremarkable. The central airways are patent. The visualized paranasal sinuses and mastoid air cells are clear. Focal right temporal craniectomy defect noted. The visualized brain appears unremarkable. There is degenerative changes of the spine most prominent at C4-C6. No acute fracture identified. The parotid, and submandibular glands appear unremarkable. The thyroid gland is small. There is  no lymphadenopathy. The soft tissues are unremarkable. No drainable fluid collection identified. A left-sided aortic arch with aberrant right subclavian artery anatomy is noted.  IMPRESSION: No acute findings.   Electronically Signed   By: Anner Crete M.D.   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 In emergency department was noted to have seizure-like activity was loaded with IV Keppra and given Ativan. Patient continued to have seizure-like activity to floor and neurology consult has been called. She was transiently-severely tachycardic in the emergency department after 155 that has improved since. Admitting to stepdown Present on Admission:  Seizure disorder  - unclear possibly pseudoseizures. Have requested  consult to evaluation and neurology. Really appreciate the help. Continue with Keppra IV for now transferred to stepdown given ongoing seizure-like activity defer to neurology if a few that repeat EEG would be warranted  . Addison disease - patient not taking recent medications secondary to painful swallowing. Will give hydrocortisone IV for now  . DI (diabetes insipidus) - continue nasal DDAVP  . Adult hypothyroidism continue Synthroid  . Adenoma of pituitary s/p resection - patient with known hypopituitarism monitor closely  . Tachycardia - currently improving we will continue to rehydrate. Patient has allergies to contrast given ongoing dyspnea will obtain VQ scan cycle CE, obtain echo . Dehydration -rehydrate Odynophagia - etiology unclear no evidence of significant erythema. Once seizure activity is controlled  can restart by mouth Carafate Benefit from GI consult to see what options are available History of chronic pain.  - Patient has history of narcotic and benzodiazepine seeking behavior. 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      Prophylaxis:   Lovenox   CODE STATUS:  FULL CODE  as per patient    Disposition: likely will need placement for rehabilitation                     Other plan as per orders.  I have spent a total of 85 min on this admission - extra time taken due to complexity of the case, need to change patietn beds statutus and to discuss case with neurology  [place]ony 11/15/2014, 1:03 AM  [facility]  Pager [phone]   after 2 AM please page floor coverage PA If 7AM-7PM, please contact the day team taking care of the patient  [email or web address]  Password [email or web address]

## 2014-11-15 NOTE — ED Notes (Signed)
Pt c/o bilateral leg pain, worsening palpation at bilateral ankles.

## 2014-11-15 NOTE — Progress Notes (Signed)
[facility] TEAM 1 - Stepdown/ICU TEAM Progress Note  Shelly Le XBL:[id number] DOB: 1960/09/21 DOA: 11/14/2014 PCP: Daneil Dan, MD  Admit HPI / Brief Narrative: 54 y.o. BF PMHx  Addison's disease; Diabetes insipidus; Pneumonia; Seizures; and Brain tumor chronic pain syndrome, noncompliance.   Presented with Burning with swallowing and deep breaths. She is unable to take any medications or drink. Patient has history of seizure disorder and was not able to tolerate her kepra due to pain on swallowing. IN ER department patient had an episode which appeared to be seizure-like and was given IV Keppra. Given severity of neck pain and pain with swallowing a CT scan of soft tissues of the neck was obtained and was unremarkable. Plain film was unremarkable.   Per review of records in February she was admitted and similar symptoms of burning with swallowing and was found to have gastric ulcers She has history of chronic pain syndrome. Patient has undergone numerous studies at the right of hospitals including [place] and here in [place]. In February she had a nuclear medicine gastric emptying study done that showed mild gastroparesis. Her son was done showing minimal sludge in gallbladder. She undergone an EGD also in February that showed shallow ulcers is continued on PPI and Carafate but apparently no longer taking Carafate.   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 She was evaluated by psychiatry who recommended continuation of current medications. MRI of the brain at that time was unremarkable. EEG was wnl. Up on arrival to floor patient started to have seizure-like activity. She was evaluated by rapid response RN who noted the patient was able to moan with painful stimuli.   She reports that she was admitted to [facility] from May 11 -16 for nausea vomiting  diarrhea and incontinence She was diagnosed with Bacteria overgrowth. Patient was supposed to be started on rifampin but did not take it secondary to financial restraints. Patient did not follow-up because she states she was feeling too weak to see a doctor.   Hospitalist was called for admission for pain with breathing, seizure disorder  HPI/Subjective: 7/14 A/O 4, patient tearful. Relates how she's been in the hospital 17 times, however was unable to give details. States had been here numerous times, and other hospitals in [place] without a diagnosis. States went to [facility] and had numerous tests run. Have been instructed to follow-up with [facility] however stated did not follow-up because she didn't want to know the answer. Does not have records from that workup with her.   Assessment/Plan: Seizure disorder -Per neurology; neurology believes pseudoseizures. -EEG; normal -Have requested Consent Form be faxed to [facility] for release of records. -Per Neurology continue Keppra 1000 mg twice a day -Obtain psychiatric consult in the a.m., pseudoseizures  Somnolent -Most likely multifactorial to include pseudoseizures, noncompliance with medications, medication overuse (gabapentin, Zanaflex, Xanax)  Addison disease - patient not taking recent medications secondary to painful swallowing. - Solu-Cortef 100 mg QID   DI (diabetes insipidus)  - continue nasal DDAVP   Hypothyroidism - continue Synthroid 50 g daily  -Obtain TSH   Adenoma of pituitary s/p resection  - patient with known hypopituitarism monitor closely   Tachycardia -Resolved -Patient has allergies to contrast given ongoing dyspnea will obtain VQ scan  -Troponin negative 2  -Echocardiogram pending  Dehydration  -rehydrate  Odynophagia  - etiology  unclear no evidence of significant erythema. Once seizure activity is controlled can restart by mouth Carafate  Chronic Pain  syndrome/drug-seeking behavior -Hx  narcotic and benzodiazepine seeking behavior.  -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 will avoid narcotics.  -Decrease Zanaflex 6 mg TID -Decrease gabapentin to 100 mg TID   Code Status: FULL Family Communication: no family present at time of exam Disposition Plan: Per neurology/psychiatry    Consultants: Vashti Hey (neurology)    Procedure/Significant Events: 7/13 CT soft tissue neck without contrast; no acute findings 7/14 EEG; Normal   Culture   Antibiotics:   DVT prophylaxis: Lovenox   Devices   LINES / TUBES:      Continuous Infusions:   Objective: VITAL SIGNS: Temp: 97.4 F (36.3 C) (07/14 1632) Temp Source: Oral (07/14 1632) BP: 109/73 mmHg (07/14 1800) Pulse Rate: 118 (07/14 1800) SPO2; FIO2:   Intake/Output Summary (Last 24 hours) at 11/15/14 1845 Last data filed at 11/15/14 1500  Gross per 24 hour  Intake 287.92 ml  Output    650 ml  Net -362.08 ml     Exam: General: Somnolent, arousable, over dramatic, A/O 4, No acute respiratory distress Eyes: Negative headache, eye pain, double vision, negative scleral hemorrhage ENT: Negative Runny nose, negative ear pain, negative tinnitus, negative gingival bleeding Neck:  Negative scars, masses, torticollis, lymphadenopathy, JVD Lungs: Clear to auscultation bilaterally without wheezes or crackles Cardiovascular: Regular rate and rhythm without murmur gallop or rub normal S1 and S2 Abdomen:negative abdominal pain, negative dysphagia, Nontender, nondistended, soft, bowel sounds positive, no rebound, no ascites, no appreciable mass Extremities: No significant cyanosis, clubbing, or edema bilateral lower extremities Psychiatric:  Tearful, melodramatic, secondary gain ? Neurologic:  Cranial nerves II through XII intact, tongue/uvula midline, all extremities muscle strength 5/5, sensation intact throughout, finger nose finger bilateral within normal  limits, quick finger touch bilateral within normal limits, negative Romberg sign, heel to shin bilateral within normal limits, standing on 1 foot bilateral within normal limits, walking on tiptoes within normal limits, walking on heels within normal limits, negative dysarthria, negative expressive aphasia, negative receptive aphasia.       Data Reviewed: Basic Metabolic Panel:  Recent Labs Lab 11/14/14 2015 11/15/14 0445  NA 139 143  K 4.1 3.7  CL 104 107  CO2 20* 25  GLUCOSE 122* 93  BUN 12 13  CREATININE 1.36* 1.03*  CALCIUM 9.6 9.1  MG  --  2.2  PHOS  --  3.6   Liver Function Tests:  Recent Labs Lab 11/15/14 0445  AST 17  ALT 11*  ALKPHOS 90  BILITOT 0.7  PROT 7.2  ALBUMIN 4.0   No results for input(s): LIPASE, AMYLASE in the last 168 hours. No results for input(s): AMMONIA in the last 168 hours. CBC:  Recent Labs Lab 11/14/14 2015 11/15/14 0445  WBC 11.2* 8.8  HGB 13.5 11.8*  HCT 40.4 36.0  MCV 79.8 80.7  PLT 361 284   Cardiac Enzymes:  Recent Labs Lab 11/15/14 0445 11/15/14 1427  TROPONINI <0.03 <0.03   BNP (last 3 results) No results for input(s): BNP in the last 8760 hours.  ProBNP (last 3 results)  Recent Labs  02/21/14 1157  PROBNP 566.6*    CBG: No results for input(s): GLUCAP in the last 168 hours.  Recent Results (from the past 240 hour(s))  MRSA PCR Screening     Status: None   Collection Time: 11/15/14  4:28 AM  Result Value Ref Range Status   MRSA  by PCR NEGATIVE NEGATIVE Final    Comment:        The GeneXpert MRSA Assay (FDA approved for NASAL specimens only), is one component of a comprehensive MRSA colonization surveillance program. It is not intended to diagnose MRSA infection nor to guide or monitor treatment for MRSA infections.      Studies:  Recent x-ray studies have been reviewed in detail by the Attending Physician  Scheduled Meds:  Scheduled Meds: . antiseptic oral rinse  7 mL Mouth Rinse BID  .  desmopressin  1 spray Nasal BID  . enoxaparin (LOVENOX) injection  40 mg Subcutaneous Daily  . gabapentin  100 mg Oral TID  . hydrocortisone sod succinate (SOLU-CORTEF) inj  100 mg Intravenous 4 times per day  . levETIRAcetam  1,000 mg Intravenous Q12H  . levothyroxine  50 mcg Intravenous Daily  . mirtazapine  15 mg Oral QHS  . olopatadine  1 drop Both Eyes BID  . pantoprazole (PROTONIX) IV  40 mg Intravenous QHS  . sodium chloride  3 mL Intravenous Q12H    Time spent on care of this patient: 40 mins   WOODS, Geraldo Docker , MD  [facility] Office  [phone] Pager [phone]  On-Call/Text Page:      [email or web address]      password [email or web address]  If 7PM-7AM, please contact night-coverage [email or web address] Password [email or web address] 11/15/2014, 6:45 PM   LOS: 0 days   Care during the described time interval was provided by me .  I have reviewed this patient's available data, including medical history, events of note, physical examination, and all test results as part of my evaluation. I have personally reviewed and interpreted all radiology studies.   Dia Crawford, MD [phone] Pager

## 2014-11-16 DIAGNOSIS — F4323 Adjustment disorder with mixed anxiety and depressed mood: Principal | ICD-10-CM

## 2014-11-16 MED ORDER — PANTOPRAZOLE SODIUM 40 MG PO TBEC
40.0000 mg | DELAYED_RELEASE_TABLET | Freq: Every day | ORAL | Status: DC
  Administered 2014-11-16 – 2014-11-20 (×5): 40 mg via ORAL
  Filled 2014-11-16 (×5): qty 1

## 2014-11-16 MED ORDER — HYDROCORTISONE NA SUCCINATE PF 100 MG IJ SOLR
50.0000 mg | Freq: Two times a day (BID) | INTRAMUSCULAR | Status: DC
  Administered 2014-11-16 – 2014-11-18 (×4): 50 mg via INTRAVENOUS
  Filled 2014-11-16 (×2): qty 1
  Filled 2014-11-16 (×4): qty 2

## 2014-11-16 MED ORDER — LEVOTHYROXINE SODIUM 50 MCG PO TABS
50.0000 ug | ORAL_TABLET | Freq: Every day | ORAL | Status: DC
  Administered 2014-11-17: 50 ug via ORAL
  Filled 2014-11-16: qty 1

## 2014-11-16 MED ORDER — LEVETIRACETAM 500 MG PO TABS
1000.0000 mg | ORAL_TABLET | Freq: Two times a day (BID) | ORAL | Status: DC
  Administered 2014-11-16 – 2014-11-20 (×9): 1000 mg via ORAL
  Filled 2014-11-16 (×10): qty 2

## 2014-11-16 NOTE — Progress Notes (Signed)
[place] TEAM 1 - Stepdown/ICU TEAM Progress Note  KEYRA VIRELLA LZJ:[id number] DOB: Jul 03, 1960 DOA: 11/14/2014 PCP: Daneil Dan, MD  Admit HPI / Brief Narrative: 54 y.o. F Hx  Addison's disease; Diabetes insipidus; Pneumonia; Seizures; Brain tumor, chronic pain syndrome, and noncompliance who presented with c/o burning with swallowing and deep breaths. She was unable to take any medications or drink. Patient has history of seizure disorder and was not able to tolerate her kepra due to pain on swallowing. In the ED she then had an episode which appeared to be seizure-like and was given IV Keppra. CT of soft tissues of the neck was obtained and was unremarkable. Plain film was unremarkable.  Per review of records in February she was admitted w/ similar symptoms of burning with swallowing and was found to have gastric ulcers.  [place] hospitals [place], [name]  In [place] she had a nuclear medicine gastric emptying study that showed mild gastroparesis. An [place] noted minimal sludge in the gallbladder. EGD also in February showed shallow ulcers.   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 She was evaluated by Psychiatry who recommended continuation of current medications. MRI of the brain at that time was unremarkable. EEG was wnl.   HPI/Subjective: Upon entering the room the patient immediately begins to explain to me that she needs a central venous line.  When asked questions about her overall status at present she does not answer them.  Instead she continues to perseverate on the belief that she needs a central line and that she shouldn't have to beg for one and that we simply don't understand her care and that she has always gotten the best care possible at [facility] and that she is too complicated for anyone outside of [facility] to  understand.  When I explained to the patient that we do not simply place central lines and everyone due to multiple risks associated with these lines she tells me that I simply don't understand her complex medical care.  Attempts to direct the conversation to other topics or to obtain a full review of systems are not successful.  She did however give me her consent to access her records from [facility] via care everywhere.  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 -Solu-Cortef per IV as patient on chronic steroids therapy -I note from [name] (a physician at [name] endocrinology) dated 09/07/2014 the patient has actually been found to not be suffering with adrenal insufficiency - [name] states in his note that he attempted to wean the patient from steroids but that she refuses to do so  DI (diabetes insipidus) -Review of records from [facility] reports that the patient did in fact have DI immediately following her pituitary adenoma resection in 2000 but confirms that she was subsequently taken off of DDAVP and was able to maintain a normal sodium and urine specific gravity  - the note goes on to explain and the patient was later placed back on DDAVP "when necessary for urinary frequency" - this  notes suggests that DDAVP should be discontinued in the future an outpatient follow-up   Hypothyroidism -continue Synthroid 50 g daily  -TSH is very low at 0.057 suggesting overcorrection with Synthroid - will check free T4 and  consider decreasing Synthroid dose  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 but review of her notes from [facility] indicates that she does not actually have hypopituitary is some and has been 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 in follow-up testing   Tachycardia -Resolved -Troponin negative 2  -Echocardiogram unrevealing - normal VQ scan  Dehydration  -Corrected  Odynophagia -etiology unclear - patient has had a recent extensive GI evaluation at [facility] and the results of this are available and care everywhere - of note the admitting team at [facility] felt that the patient's symptoms were possibly psychosomatic in etiology   Chronic Pain syndrome / Drug-seeking behavior -Hx  narcotic and benzodiazepine seeking behavior -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 - will avoid narcotics  -Decrease Zanaflex 6 mg TID -Decrease gabapentin to 100 mg TID  Code Status: FULL Family Communication: no family present at time of exam Disposition Plan: Transfer to medical bed - await psychiatric consultation/recommendations - advance diet as able to tolerate - discharge home when able to tolerate oral medications  Consultants: Neurology Psychiatry  Procedure/Significant Events: 7/13 CT soft tissue neck without contrast; no acute findings 7/14 EEG - norma  Antibiotics: None  DVT prophylaxis: Lovenox  Objective: Blood pressure 107/62, pulse 89, temperature 98.5 F (36.9 C), temperature source Oral, resp. rate 17, height 5\' 5"  (1.651 m), weight 68.7 kg (151 lb 7.3 oz), SpO2 97 %.  Intake/Output Summary (Last 24 hours) at 11/16/14 1020 Last data filed at 11/15/14 2134  Gross per 24 hour  Intake    110 ml  Output    425 ml  Net   -315 ml   Exam: General: No acute respiratory distress Lungs: Clear to auscultation bilaterally without wheezes or  crackles Cardiovascular: Regular rate and rhythm without murmur gallop or rub normal S1 and S2 Abdomen: Nontender, nondistended, soft, bowel sounds positive, no rebound, no ascites, no appreciable mass Extremities: No significant cyanosis, clubbing, or edema bilateral lower extremities  Data Reviewed: Basic Metabolic Panel:  Recent Labs Lab 11/14/14 2015 11/15/14 0445  NA 139 143  K 4.1 3.7  CL 104 107  CO2 20* 25  GLUCOSE 122* 93  BUN 12 13  CREATININE 1.36* 1.03*  CALCIUM 9.6 9.1  MG  --  2.2  PHOS  --  3.6   Liver Function Tests:  Recent Labs Lab 11/15/14 0445  AST 17  ALT 11*  ALKPHOS 90  BILITOT 0.7  PROT 7.2  ALBUMIN 4.0   CBC:  Recent Labs Lab 11/14/14 2015 11/15/14 0445  WBC 11.2* 8.8  HGB 13.5 11.8*  HCT 40.4 36.0  MCV 79.8 80.7  PLT 361 284   Cardiac Enzymes:  Recent Labs Lab 11/15/14 0445 11/15/14 1427 11/15/14 2025  TROPONINI <0.03 <0.03 <0.03    Recent Results (from the past 240 hour(s))  MRSA PCR Screening     Status: None   Collection Time: 11/15/14  4:28 AM  Result Value Ref Range Status   MRSA by PCR NEGATIVE NEGATIVE Final    Comment:        The GeneXpert MRSA Assay (FDA approved for NASAL specimens only), is one component of a comprehensive MRSA colonization  surveillance program. It is not intended to diagnose MRSA infection nor to guide or monitor treatment for MRSA infections.      Studies:  Recent x-ray studies have been reviewed in detail by the Attending Physician  Scheduled Meds:  Scheduled Meds: . antiseptic oral rinse  7 mL Mouth Rinse BID  . cyclobenzaprine  10 mg Oral Once  . desmopressin  1 spray Nasal BID  . enoxaparin (LOVENOX) injection  40 mg Subcutaneous Daily  . gabapentin  100 mg Oral TID  . hydrocortisone sod succinate (SOLU-CORTEF) inj  100 mg Intravenous 4 times per day  . levETIRAcetam  1,000 mg Oral BID  . levothyroxine  50 mcg Intravenous Daily  . mirtazapine  15 mg Oral QHS  .  olopatadine  1 drop Both Eyes BID  . pantoprazole (PROTONIX) IV  40 mg Intravenous QHS  . sodium chloride  3 mL Intravenous Q12H    Time spent on care of this patient: 35 mins  Cherene Altes, MD [facility] For Consults/Admissions - Flow Manager - [phone] Office  [phone]  Contact MD directly via text page:      [email or web address]      password [facility]  11/16/2014, 10:20 AM   LOS: 1 day

## 2014-11-16 NOTE — Progress Notes (Signed)
Pt transferred per MD order, report given to carol, RN. Pts children notified via phone per transferring RN per pt request. NAD VSS.
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
## 2014-11-16 NOTE — Progress Notes (Signed)
Subjective: Patient awake and alert.  Reports that she is having hot and cold flashes.  Had episodes overnight that were seizure-like but patient was responsive throughout.    Objective: Current vital signs: BP 107/62 mmHg  Pulse 89  Temp(Src) 98.5 F (36.9 C) (Oral)  Resp 17  Ht 5\' 5"  (1.651 m)  Wt 68.7 kg (151 lb 7.3 oz)  BMI 25.20 kg/m2  SpO2 97% Vital signs in last 24 hours: Temp:  [97.4 F (36.3 C)-98.5 F (36.9 C)] 98.5 F (36.9 C) (07/15 0800) Pulse Rate:  [70-130] 89 (07/15 0800) Resp:  [13-33] 17 (07/15 0800) BP: (93-132)/(46-95) 107/62 mmHg (07/15 0800) SpO2:  [96 %-100 %] 97 % (07/15 0800)  Intake/Output from previous day: 07/14 0701 - 07/15 0700 In: 220 [IV Piggyback:220] Out: 384 [Urine:875] Intake/Output this shift:   Nutritional status: Diet NPO time specified  Neurologic Exam: Mental Status: Open her eyes, follows simple commands. Speech fluent without evidence of aphasia.  Cranial Nerves: II: Discs flat bilaterally; Visual fields grossly normal, pupils equal, round, reactive to light and accommodation III,IV, VI: ptosis not present, extra-ocular motions intact bilaterally V,VII: smile symmetric, facial light touch sensation can not be reliably assessed.  VIII: hearing grossly normal bilaterally IX,X: gag reflex present XI: bilateral shoulder shrug no tested XII: midline tongue extension without atrophy or fasciculations Motor: Moves all extremities spontaneously and symmetrically. Tone and bulk:normal tone throughout; no atrophy noted Sensory: Pinprick and light touch intact throughout, bilaterally Deep Tendon Reflexes:  1+ throughout   Lab Results: Basic Metabolic Panel:  Recent Labs Lab 11/14/14 2015 11/15/14 0445  NA 139 143  K 4.1 3.7  CL 104 107  CO2 20* 25  GLUCOSE 122* 93  BUN 12 13  CREATININE 1.36* 1.03*  CALCIUM 9.6 9.1  MG  --  2.2  PHOS  --  3.6    Liver Function Tests:  Recent Labs Lab 11/15/14 0445  AST 17   ALT 11*  ALKPHOS 90  BILITOT 0.7  PROT 7.2  ALBUMIN 4.0   No results for input(s): LIPASE, AMYLASE in the last 168 hours. No results for input(s): AMMONIA in the last 168 hours.  CBC:  Recent Labs Lab 11/14/14 2015 11/15/14 0445  WBC 11.2* 8.8  HGB 13.5 11.8*  HCT 40.4 36.0  MCV 79.8 80.7  PLT 361 284    Cardiac Enzymes:  Recent Labs Lab 11/15/14 0445 11/15/14 1427 11/15/14 2025  TROPONINI <0.03 <0.03 <0.03    Lipid Panel: No results for input(s): CHOL, TRIG, HDL, CHOLHDL, VLDL, LDLCALC in the last 168 hours.  CBG: No results for input(s): GLUCAP in the last 168 hours.  Microbiology: Results for orders placed or performed during the hospital encounter of 11/14/14  MRSA PCR Screening     Status: None   Collection Time: 11/15/14  4:28 AM  Result Value Ref Range Status   MRSA by PCR NEGATIVE NEGATIVE Final    Comment:        The GeneXpert MRSA Assay (FDA approved for NASAL specimens only), is one component of a comprehensive MRSA colonization surveillance program. It is not intended to diagnose MRSA infection nor to guide or monitor treatment for MRSA infections.     Coagulation Studies: No results for input(s): LABPROT, INR in the last 72 hours.  Imaging: Dg Chest 2 View  11/14/2014   CLINICAL DATA:  Chest pain and shortness of breath for 2 days  EXAM: CHEST - 2 VIEW  COMPARISON:  08/04/2014  FINDINGS: The heart  size and mediastinal contours are within normal limits. Both lungs are clear. The visualized skeletal structures are unremarkable.  IMPRESSION: No active disease.   Electronically Signed   By: Inez Catalina M.D.   On: 11/14/2014 20:51   Ct Soft Tissue Neck Wo Contrast  11/14/2014   CLINICAL DATA:  54 year old female with bilateral anterior neck pain  EXAM: CT NECK WITHOUT CONTRAST  TECHNIQUE: Multidetector CT imaging of the neck was performed following the standard protocol without intravenous contrast.  COMPARISON:  Head CT dated 08/30/2014   FINDINGS: Evaluation of this exam is limited in the absence of intravenous contrast.  The visualized upper lungs appear unremarkable. The central airways are patent. The visualized paranasal sinuses and mastoid air cells are clear. Focal right temporal craniectomy defect noted. The visualized brain appears unremarkable. There is degenerative changes of the spine most prominent at C4-C6. No acute fracture identified. The parotid, and submandibular glands appear unremarkable. The thyroid gland is small. There is no lymphadenopathy. The soft tissues are unremarkable. No drainable fluid collection identified. A left-sided aortic arch with aberrant right subclavian artery anatomy is noted.  IMPRESSION: No acute findings.   Electronically Signed   By: Anner Crete M.D.   On: 11/14/2014 23:22   Nm Pulmonary Perf And Vent  11/15/2014   CLINICAL DATA:  Chest pain and shortness of breath.  EXAM: NUCLEAR MEDICINE VENTILATION - PERFUSION LUNG SCAN  TECHNIQUE: Ventilation images were obtained in multiple projections using inhaled aerosol Tc-5m DTPA. Perfusion images were obtained in multiple projections after intravenous injection of Tc-20m MAA.  RADIOPHARMACEUTICALS:  40 mCi of Technetium-58m DTPA aerosol inhalation and 6 mCi of Technetium-64m MAA IV  COMPARISON:  Chest x-ray dated 11/14/2014  FINDINGS: Ventilation: No focal ventilation defect.  Perfusion: No wedge shaped peripheral perfusion defects to suggest acute pulmonary embolism.  IMPRESSION: Normal ventilation-perfusion lung scan. No evidence of pulmonary embolism.   Electronically Signed   By: Lorriane Shire M.D.   On: 11/15/2014 12:29    Medications:  I have reviewed the patient's current medications. Scheduled: . antiseptic oral rinse  7 mL Mouth Rinse BID  . cyclobenzaprine  10 mg Oral Once  . desmopressin  1 spray Nasal BID  . enoxaparin (LOVENOX) injection  40 mg Subcutaneous Daily  . gabapentin  100 mg Oral TID  . hydrocortisone sod succinate  (SOLU-CORTEF) inj  100 mg Intravenous 4 times per day  . levETIRAcetam  1,000 mg Intravenous Q12H  . levothyroxine  50 mcg Intravenous Daily  . mirtazapine  15 mg Oral QHS  . olopatadine  1 drop Both Eyes BID  . pantoprazole (PROTONIX) IV  40 mg Intravenous QHS  . sodium chloride  3 mL Intravenous Q12H    Assessment/Plan: Patient with recurrent seizure-like episodes not felt to be epileptic in origin.  On Keppra.  Would not escalate dose at this time.  EEG unremarkable.    Recommendations: 1.  Would continue Keppra at current dose.  May change to po.     LOS: 1 day   Alexis Goodell, MD [facility] [phone] 11/16/2014  9:02 AM

## 2014-11-17 DIAGNOSIS — G894 Chronic pain syndrome: Secondary | ICD-10-CM

## 2014-11-17 DIAGNOSIS — F445 Conversion disorder with seizures or convulsions: Secondary | ICD-10-CM

## 2014-11-17 DIAGNOSIS — E038 Other specified hypothyroidism: Secondary | ICD-10-CM

## 2014-11-17 DIAGNOSIS — E232 Diabetes insipidus: Secondary | ICD-10-CM

## 2014-11-17 LAB — COMPREHENSIVE METABOLIC PANEL
ALBUMIN: 3.2 g/dL — AB (ref 3.5–5.0)
ALK PHOS: 66 U/L (ref 38–126)
ALT: 12 U/L — ABNORMAL LOW (ref 14–54)
AST: 20 U/L (ref 15–41)
Anion gap: 5 (ref 5–15)
BUN: 17 mg/dL (ref 6–20)
CALCIUM: 8.8 mg/dL — AB (ref 8.9–10.3)
CO2: 27 mmol/L (ref 22–32)
CREATININE: 1 mg/dL (ref 0.44–1.00)
Chloride: 105 mmol/L (ref 101–111)
GFR calc non Af Amer: >60 mL/min (ref >60)
GLUCOSE: 124 mg/dL — AB (ref 65–99)
POTASSIUM: 4.2 mmol/L (ref 3.5–5.1)
Sodium: 137 mmol/L (ref 135–145)
Total Bilirubin: 0.2 mg/dL — ABNORMAL LOW (ref 0.3–1.2)
Total Protein: 5.9 g/dL — ABNORMAL LOW (ref 6.5–8.1)

## 2014-11-17 LAB — T4, FREE: FREE T4: 1.63 ng/dL — AB (ref 0.61–1.12)

## 2014-11-17 LAB — URINE CULTURE

## 2014-11-17 LAB — CBC
HCT: 32.5 % — ABNORMAL LOW (ref 36.0–46.0)
HEMOGLOBIN: 10.8 g/dL — AB (ref 12.0–15.0)
MCH: 26.8 pg (ref 26.0–34.0)
MCHC: 33.2 g/dL (ref 30.0–36.0)
MCV: 80.6 fL (ref 78.0–100.0)
Platelets: 235 10*3/uL (ref 150–400)
RBC: 4.03 MIL/uL (ref 3.87–5.11)
RDW: 13.3 % (ref 11.5–15.5)
WBC: 5.7 10*3/uL (ref 4.0–10.5)

## 2014-11-17 MED ORDER — PHENOL 1.4 % MT LIQD
1.0000 | OROMUCOSAL | Status: DC | PRN
  Administered 2014-11-17: 1 via OROMUCOSAL
  Filled 2014-11-17: qty 177

## 2014-11-17 MED ORDER — WHITE PETROLATUM GEL
Status: DC | PRN
  Administered 2014-11-17: 0.2 via TOPICAL

## 2014-11-17 MED ORDER — LEVOTHYROXINE SODIUM 25 MCG PO TABS
37.5000 ug | ORAL_TABLET | Freq: Every day | ORAL | Status: DC
  Administered 2014-11-18 – 2014-11-20 (×3): 37.5 ug via ORAL
  Filled 2014-11-17 (×3): qty 2

## 2014-11-17 NOTE — Evaluation (Signed)
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 Patient also reports burning sensation with swallowing.  Clinical Impression  Patient demonstrates deficits in functional mobility as indicated below. Will benefit from continued skilled PT to address deficits and maximize function. Will see as indicated and progress as tolerated. OF NOTE: patient reports multiple falls at home, states that family is unable to provide 24/7 assist. Recommend ST SNF based on presentation of mobility at this time.    Follow Up Recommendations SNF;Supervision for mobility/OOB    Equipment Recommendations  None recommended by PT    Recommendations for Other Services       Precautions / Restrictions Precautions Precautions: Fall Restrictions Weight Bearing Restrictions: No      Mobility  Bed Mobility               General bed mobility comments: recevied in chair  Transfers Overall transfer level: Needs assistance Equipment used: Rolling walker (2 wheeled);1 person hand held assist Transfers: Sit to/from Stand Sit to Stand: Min assist         General transfer comment: Assist to power up, assist for stability, significantly increased time to perform basic transfers. Heavy reliance on assistive device and grab bars to perform   Ambulation/Gait Ambulation/Gait assistance: Min assist;Min guard Ambulation Distance (Feet): 24 Feet Assistive device: 1 person hand held assist;Rolling walker (2 wheeled) (12 ft with HHA, 12 ft with RW) Gait Pattern/deviations: Step-through pattern;Decreased stride length;Shuffle;Scissoring;Narrow base of support;Drifts right/left Gait velocity: decreased Gait velocity interpretation: <1.8 ft/sec, indicative of risk for recurrent  falls General Gait Details: inconsitent deficits in gait, instability noted, reliance on assist, patient crossing feet over one another (x2) narrow and extremely slow stride.   Stairs            Wheelchair Mobility    Modified Rankin (Stroke Patients Only)       Balance Overall balance assessment: History of Falls                                           Pertinent Vitals/Pain Pain Assessment: Faces Faces Pain Scale: Hurts a little bit Pain Location: unable to specify    Home Living Family/patient expects to be discharged to:: Private residence Living Arrangements: Alone Available Help at Discharge: Family;Available PRN/intermittently Type of Home: Apartment Home Access: Stairs to enter Entrance Stairs-Rails: Right Entrance Stairs-Number of Steps: 12 Home Layout: One level Home Equipment: Walker - 4 wheels;Cane - single point Additional Comments: patient indicated that her family can not provide round the clock assist    Prior Function Level of Independence: Independent with assistive device(s)         Comments: unclear true mobility PTA, inconsistent reports and patient very tangential with poor ability to redirect back to obtain answers regarding functional status     Hand Dominance   Dominant Hand: Right    Extremity/Trunk Assessment   Upper Extremity Assessment: Overall WFL for tasks assessed           Lower Extremity Assessment: Generalized weakness (inconsistent effort upon testing)      Cervical / Trunk Assessment: Kyphotic  Communication   Communication: No difficulties  Cognition Arousal/Alertness: Awake/alert Behavior During Therapy: Anxious;Flat affect;Impulsive Overall Cognitive  Status: Difficult to assess                      General Comments      Exercises        Assessment/Plan    PT Assessment Patient needs continued PT services  PT Diagnosis Difficulty walking;Abnormality of  gait;Generalized weakness;Acute pain;Altered mental status   PT Problem List Decreased strength;Decreased activity tolerance;Decreased balance;Decreased mobility;Decreased coordination;Decreased safety awareness;Pain  PT Treatment Interventions DME instruction;Gait training;Functional mobility training;Therapeutic activities;Therapeutic exercise;Balance training;Patient/family education   PT Goals (Current goals can be found in the Care Plan section) Acute Rehab PT Goals Patient Stated Goal: to get stronger PT Goal Formulation: With patient Time For Goal Achievement: 12/01/14 Potential to Achieve Goals: Fair    Frequency Min 3X/week   Barriers to discharge Decreased caregiver support states that she lives alone and that her children cannot provide adequate assist    Co-evaluation               End of Session Equipment Utilized During Treatment: Gait belt Activity Tolerance: Patient limited by fatigue Patient left: in chair;with call bell/phone within reach Nurse Communication: Mobility status         Time: 1341-1408 PT Time Calculation (min) (ACUTE ONLY): 27 min   Charges:   PT Evaluation $Initial PT Evaluation Tier I: 1 Procedure PT Treatments $Therapeutic Activity: 8-22 mins   PT G CodesDuncan Dull Nov 29, 2014, 2:55 PM Alben Deeds, [place] DPT  [phone]

## 2014-11-17 NOTE — Plan of Care (Signed)
Problem: Phase I Progression Outcomes Goal: OOB as tolerated unless otherwise ordered Outcome: Completed/Met Date Met:  11/17/14 Up in chair today

## 2014-11-17 NOTE — Progress Notes (Signed)
Progress Note  Shelly Le RWE:[id number] DOB: Sep 21, 1960 DOA: 11/14/2014 PCP: Daneil Dan, MD  Brief Narrative: 54 y.o. F Hx  Addison's disease; Diabetes insipidus; Pneumonia; Seizures; Brain tumor, chronic pain syndrome, and noncompliance who presented with c/o burning with swallowing and deep breaths. She was unable to take any medications or drink. Patient has history of seizure disorder and was not able to tolerate her kepra due to pain on swallowing. In the ED she then had an episode which appeared to be seizure-like and was given IV Keppra. CT of soft tissues of the neck was obtained and was unremarkable. Plain film was unremarkable.  Perreview of records in February she was admitted w/ similar symptoms of burning with swallowing and was found to have gastric ulcers.  [facility] [place] [facility] she had a nuclear medicine gastric emptying study that showed mild gastroparesis. An [place] noted minimal sludge in the gallbladder. EGD also in February showed shallow ulcers.   During an admission to [facility] in February she was having repeated episodes which were thought to be pseudoseizures. She was evaluated by Psychiatry who recommended continuation of current medications. MRI of the brain at that time was unremarkable. EEG was wnl.   Subjective: Very difficult to hold a conversation with the patient. She keeps talking about her experiences at the various hospitals where she has been admitted.  This was similar to the experience the rounding physician had yesterday as well. She kept asking about a central line. Per nurse she has been able to tolerate her liquid diet. She hasn't had any vomiting.  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 Their recommendations noted.  Somnolent -Most likely multifactorial to include pseudoseizures, noncompliance with medications, medication overuse (gabapentin, Zanaflex, Xanax). Alert and talkative today.  Chronic steroid therapy reportedly not Addison's disease -Solu-Cortef per IV as patient on chronic steroids therapy. Change to oral tomorrow. - note from [name] (a physician at [name] endocrinology) dated 09/07/2014 suggested that the patient has actually been found to not be suffering with adrenal insufficiency - [name] states in his note that he attempted to wean the patient from steroids but that she refuses to do so.  DI (diabetes insipidus) -Review of records from [facility] reports that the patient did in fact have DI immediately following her pituitary adenoma resection in 2000 but confirms that she was subsequently taken off of DDAVP and was able to maintain a normal sodium and urine specific gravity  - the note goes on to explain and the patient was later placed back on DDAVP "when necessary for urinary frequency" - this notes suggests that DDAVP should be discontinued in the future an outpatient follow-up.  Hypothyroidism -TSH is very low at 0.057 suggesting overcorrection with Synthroid. Free T4 noted to be elevated as well. We will decrease the dose of Synthroid slightly. This will need to be followed up as outpatient.  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 but review of [facility] indicates that she does not actually have hypopituitarism and has been 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 in follow-up testing   Tachycardia -Resolved -Troponin negative 2  -Echocardiogram unrevealing - normal VQ scan  Dehydration  -  Corrected  Odynophagia -etiology unclear - patient has had a recent extensive [facility] evaluation at [facility] and the results of this are available and care everywhere - of note the admitting team at [facility] felt that the patient's symptoms were possibly psychosomatic in etiology   Chronic Pain syndrome / Drug-seeking behavior -Hx of narcotic and benzodiazepine seeking behavior -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 Advance diet. Mobilize.  Consultants: Neurology Psychiatry  Procedure/Significant Events: 7/13 CT soft tissue neck without contrast; no acute findings 7/14 EEG - norma  Antibiotics: None    Objective: Blood pressure 99/60, pulse 65, temperature 97.7 F (36.5 C), temperature source Oral, resp. rate 16, height 5\' 5"  (1.651 m), weight 68.7 kg (151 lb 7.3 oz), SpO2 95 %.  Intake/Output Summary (Last 24 hours) at 11/17/14 1025 Last data filed at 11/17/14 0700  Gross per 24 hour  Intake    563 ml  Output    825 ml  Net   -262 ml   Exam: General: No acute distress Lungs: Clear to auscultation bilaterally without wheezes or crackles Cardiovascular: Regular rate and rhythm without murmur gallop or rub normal S1 and S2 Abdomen: Nontender, nondistended, soft, bowel sounds positive, no rebound, no ascites, no appreciable mass Extremities: No significant cyanosis, clubbing, or edema bilateral lower extremities  Data Reviewed: Basic Metabolic Panel:  Recent Labs Lab 11/14/14 2015 11/15/14 0445 11/17/14 0513  NA 139 143 137  K 4.1 3.7 4.2  CL 104 107 105  CO2 20* 25 27  GLUCOSE 122* 93 124*  BUN 12 13 17   CREATININE 1.36* 1.03* 1.00  CALCIUM 9.6 9.1 8.8*  MG  --  2.2  --   PHOS  --  3.6  --    Liver Function Tests:  Recent Labs Lab 11/15/14 0445 11/17/14 0513  AST 17 20  ALT 11* 12*  ALKPHOS 90 66  BILITOT  0.7 0.2*  PROT 7.2 5.9*  ALBUMIN 4.0 3.2*   CBC:  Recent Labs Lab 11/14/14 2015 11/15/14 0445 11/17/14 0513  WBC 11.2* 8.8 5.7  HGB 13.5 11.8* 10.8*  HCT 40.4 36.0 32.5*  MCV 79.8 80.7 80.6  PLT 361 284 235   Cardiac Enzymes:  Recent Labs Lab 11/15/14 0445 11/15/14 1427 11/15/14 2025  TROPONINI <0.03 <0.03 <0.03    Recent Results (from the past 240 hour(s))  MRSA PCR Screening     Status: None   Collection Time: 11/15/14  4:28 AM  Result Value Ref Range Status   MRSA by PCR NEGATIVE NEGATIVE Final    Comment:        The GeneXpert MRSA Assay (FDA approved for NASAL specimens only), is one component of a comprehensive MRSA colonization surveillance program. It is not intended to diagnose MRSA infection nor to guide or monitor treatment for MRSA infections.   Urine culture     Status: None (Preliminary result)   Collection Time: 11/15/14 12:30 PM  Result Value Ref Range Status   Specimen Description URINE, RANDOM  Final   Special Requests NONE  Final   Culture CULTURE REINCUBATED FOR BETTER GROWTH  Final   Report Status PENDING  Incomplete     Scheduled Meds:  Scheduled Meds: . antiseptic oral rinse  7 mL Mouth Rinse BID  .  desmopressin  1 spray Nasal BID  . enoxaparin (LOVENOX) injection  40 mg Subcutaneous Daily  . gabapentin  100 mg Oral TID  . hydrocortisone sod succinate (SOLU-CORTEF) inj  50 mg Intravenous Q12H  . levETIRAcetam  1,000 mg Oral BID  . [START ON 11/18/2014] levothyroxine  37.5 mcg Oral QAC breakfast  . mirtazapine  15 mg Oral QHS  . olopatadine  1 drop Both Eyes BID  . pantoprazole  40 mg Oral Daily  . sodium chloride  3 mL Intravenous Q12H     Bonnielee Haff [phone]  [facility] For Consults/Admissions - Flow Manager - [phone] Office  [phone]  Contact MD directly via text page:      [email or web address]      password [email or web address]  11/17/2014, 10:25 AM   LOS: 2 days

## 2014-11-18 DIAGNOSIS — R1310 Dysphagia, unspecified: Secondary | ICD-10-CM

## 2014-11-18 MED ORDER — BOOST PLUS PO LIQD
237.0000 mL | Freq: Three times a day (TID) | ORAL | Status: DC
  Administered 2014-11-18 – 2014-11-20 (×6): 237 mL via ORAL
  Filled 2014-11-18 (×16): qty 237

## 2014-11-18 MED ORDER — HYDROCORTISONE 10 MG PO TABS
10.0000 mg | ORAL_TABLET | Freq: Every evening | ORAL | Status: DC
  Administered 2014-11-18 – 2014-11-20 (×3): 10 mg via ORAL
  Filled 2014-11-18 (×3): qty 1

## 2014-11-18 MED ORDER — TIZANIDINE HCL 2 MG PO TABS: 6.0000 mg | ORAL_TABLET | Freq: Three times a day (TID) | ORAL | Status: DC | PRN

## 2014-11-18 MED ORDER — LEVOTHYROXINE SODIUM 25 MCG PO TABS: 37.5000 ug | ORAL_TABLET | Freq: Every day | ORAL | Status: AC

## 2014-11-18 MED ORDER — GABAPENTIN 100 MG PO CAPS: 100.0000 mg | ORAL_CAPSULE | Freq: Three times a day (TID) | ORAL | Status: AC

## 2014-11-18 MED ORDER — HYDROCORTISONE 20 MG PO TABS
20.0000 mg | ORAL_TABLET | Freq: Every morning | ORAL | Status: DC
  Administered 2014-11-19 – 2014-11-20 (×2): 20 mg via ORAL
  Filled 2014-11-18 (×2): qty 1

## 2014-11-18 MED ORDER — ONDANSETRON HCL 4 MG PO TABS: 4.0000 mg | ORAL_TABLET | Freq: Four times a day (QID) | ORAL | Status: DC | PRN

## 2014-11-18 MED ORDER — BOOST PLUS PO LIQD: 237.0000 mL | Freq: Three times a day (TID) | ORAL | Status: AC

## 2014-11-18 NOTE — Progress Notes (Signed)
OT Cancellation Note  Patient Details Name: [facility] MRN: [id number] DOB: [birth year]   Cancelled Treatment:    Reason Eval/Treat Not Completed: Other (comment) Pt is Medicare and current D/C plan per MD note (11/18/14) is SNF. No apparent immediate acute care OT needs, therefore will defer OT to SNF. If OT eval is needed please call Acute Rehab Dept. at [phone] or text page OT at [phone].    Almon Register [id number] 11/18/2014, 3:50 PM

## 2014-11-18 NOTE — Progress Notes (Signed)
Progress Note  [facility] JYN:[id number] DOB: 11/20/1960 DOA: 11/14/2014 PCP: Daneil Dan, MD  Brief Narrative: 54 y.o. F Hx  Addison's disease; Diabetes insipidus; Pneumonia; Seizures; Brain tumor, chronic pain syndrome, and noncompliance who presented with c/o burning with swallowing and deep breaths. She was unable to take any medications or drink. Patient has history of seizure disorder and was not able to tolerate her kepra due to pain on swallowing. In the ED she then had an episode which appeared to be seizure-like and was given IV Keppra. CT of soft tissues of the neck was obtained and was unremarkable. Plain film was unremarkable.  Perreview of records in February she was admitted w/ similar symptoms of burning with swallowing and was found to have gastric ulcers.  Patient has undergone numerous studies at various hospitals including [place], [name] she had a nuclear medicine gastric emptying study that showed mild gastroparesis. An [place] noted minimal sludge in the gallbladder. EGD also in February showed shallow ulcers.   During an admission to [facility] in February she was having repeated episodes which were thought to be pseudoseizures. She was evaluated by Psychiatry who recommended continuation of current medications. MRI of the brain at that time was unremarkable. EEG was wnl.   Subjective: Patient requesting advancement in diet. She has been tolerating her full liquids. Denies any new complaints.   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 Their recommendations noted.  Somnolent -Most likely multifactorial to include pseudoseizures, noncompliance with medications, medication overuse (gabapentin, Zanaflex, Xanax). Alert and talkative now.  Chronic steroid therapy  reportedly not Addison's disease -Solu-Cortef per IV as patient on chronic steroids therapy. Change to oral. - note from [name] (a physician at [facility] endocrinology) dated 09/07/2014 suggested that the patient has actually been found to not be suffering with adrenal insufficiency - [name] states in his note that he attempted to wean the patient from steroids but that she refuses to do so.  DI (diabetes insipidus) -Review of records from [facility] reports that the patient did in fact have DI immediately following her pituitary adenoma resection in 2000 but confirms that she was subsequently taken off of DDAVP and was able to maintain a normal sodium and urine specific gravity  - the note goes on to explain and the patient was later placed back on DDAVP "when necessary for urinary frequency" - this notes suggests that DDAVP should be discontinued in the future an outpatient follow-up.  Hypothyroidism -TSH is very low at 0.057 suggesting overcorrection with Synthroid. Free T4 noted to be elevated as well. We will decrease the dose of Synthroid slightly. This will need to be followed up as outpatient.  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 but review of her notes from [facility] indicates that she does not actually have hypopituitarism and has been 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 in follow-up testing   Tachycardia -Resolved -Troponin negative 2  -Echocardiogram unrevealing - normal VQ scan  Dehydration  -Corrected  Odynophagia -etiology unclear - patient has had a recent extensive GI evaluation at [facility] and the results of this are available and care everywhere - of note the admitting team at [facility] felt that the patient's symptoms were possibly psychosomatic in  etiology. Advance diet.  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 Social worker consult. Patient agrees to go to skilled nursing facility for short-term rehabilitation. Advance diet today. Possible discharge tomorrow.  Consultants: Neurology Psychiatry  Procedure/Significant Events: 7/13 CT soft tissue neck without contrast; no acute findings 7/14 EEG - normal  Antibiotics: None    Objective: Blood pressure 107/48, pulse 59, temperature 97.7 F (36.5 C), temperature source Oral, resp. rate 14, height 5\' 5"  (1.651 m), weight 68.7 kg (151 lb 7.3 oz), SpO2 100 %.  Intake/Output Summary (Last 24 hours) at 11/18/14 1003 Last data filed at 11/17/14 1856  Gross per 24 hour  Intake    600 ml  Output      0 ml  Net    600 ml   Exam: General: No acute distress Lungs: Clear to auscultation bilaterally without wheezes or crackles Cardiovascular: Regular rate and rhythm without murmur gallop or rub normal S1 and S2 Abdomen: Nontender, nondistended, soft, bowel sounds positive, no rebound, no ascites, no appreciable mass Extremities: No significant cyanosis, clubbing, or edema bilateral lower extremities  Data Reviewed: Basic Metabolic Panel:  Recent Labs Lab 11/14/14 2015 11/15/14 0445 11/17/14 0513  NA 139 143 137  K 4.1 3.7 4.2  CL 104 107 105  CO2 20* 25 27  GLUCOSE 122* 93 124*  BUN 12 13 17   CREATININE 1.36* 1.03* 1.00  CALCIUM 9.6 9.1 8.8*  MG  --  2.2  --   PHOS  --  3.6  --    Liver Function Tests:  Recent Labs Lab 11/15/14 0445 11/17/14 0513  AST 17 20  ALT 11* 12*  ALKPHOS 90 66  BILITOT 0.7 0.2*  PROT 7.2 5.9*  ALBUMIN 4.0 3.2*   CBC:  Recent Labs Lab 11/14/14 2015 11/15/14 0445 11/17/14 0513  WBC  11.2* 8.8 5.7  HGB 13.5 11.8* 10.8*  HCT 40.4 36.0 32.5*  MCV 79.8 80.7 80.6  PLT 361 284 235   Cardiac Enzymes:  Recent Labs Lab 11/15/14 0445 11/15/14 1427 11/15/14 2025  TROPONINI <0.03 <0.03 <0.03    Recent Results (from the past 240 hour(s))  MRSA PCR Screening     Status: None   Collection Time: 11/15/14  4:28 AM  Result Value Ref Range Status   MRSA by PCR NEGATIVE NEGATIVE Final    Comment:        The GeneXpert MRSA Assay (FDA approved for NASAL specimens only), is one component of a comprehensive MRSA colonization surveillance program. It is not intended to diagnose MRSA infection nor to guide or monitor treatment for MRSA infections.   Urine culture     Status: None (Preliminary result)   Collection Time: 11/15/14 12:30 PM  Result Value Ref Range Status   Specimen Description URINE, RANDOM  Final   Special Requests NONE  Final   Culture CULTURE REINCUBATED FOR BETTER GROWTH  Final   Report Status PENDING  Incomplete     Scheduled Meds:  Scheduled Meds: . antiseptic oral rinse  7 mL Mouth Rinse BID  . desmopressin  1 spray Nasal BID  . enoxaparin (LOVENOX) injection  40 mg Subcutaneous Daily  . gabapentin  100 mg Oral TID  . hydrocortisone sod succinate (  SOLU-CORTEF) inj  50 mg Intravenous Q12H  . levETIRAcetam  1,000 mg Oral BID  . levothyroxine  37.5 mcg Oral QAC breakfast  . mirtazapine  15 mg Oral QHS  . olopatadine  1 drop Both Eyes BID  . pantoprazole  40 mg Oral Daily  . sodium chloride  3 mL Intravenous Q12H     Bonnielee Haff [phone]  [facility] For Consults/Admissions - Flow Manager - [phone] Office  [phone]  Contact MD directly via text page:      [email or web address]      password [facility]  11/18/2014, 10:03 AM   LOS: 3 days

## 2014-11-18 NOTE — Plan of Care (Signed)
Problem: Phase II Progression Outcomes Goal: Progress activity as tolerated unless otherwise ordered Outcome: Completed/Met Date Met:  11/18/14 Up in chair and ambulating in room with assistance

## 2014-11-19 DIAGNOSIS — G40909 Epilepsy, unspecified, not intractable, without status epilepticus: Secondary | ICD-10-CM

## 2014-11-19 MED ORDER — ALPRAZOLAM 1 MG PO TABS: 1.0000 mg | ORAL_TABLET | Freq: Three times a day (TID) | ORAL | Status: AC | PRN

## 2014-11-19 MED ORDER — METOCLOPRAMIDE HCL 5 MG/ML IJ SOLN
5.0000 mg | Freq: Once | INTRAMUSCULAR | Status: AC
  Administered 2014-11-19: 5 mg via INTRAVENOUS
  Filled 2014-11-19: qty 2

## 2014-11-19 NOTE — Clinical Social Work Note (Signed)
Clinical Social Work Assessment  Patient Details  Name: Shelly Le MRN: [id number] Date of Birth: 24-Jan-1961  Date of referral:  11/18/14               Reason for consult:  Facility Placement                Permission sought to share information with:  Facility Sport and exercise psychologist, Family Supports Permission granted to share information::  Yes, Verbal Permission Granted  Name::     Jose Persia patient's son, and Loni Beckwith patient's daughter  Agency::  SNF admissions coordinator  Relationship::     Contact Information:     Housing/Transportation Living arrangements for the past 2 months:  [place] of Information:  Patient, Adult Children Patient Interpreter Needed:  None Criminal Activity/Legal Involvement Pertinent to Current Situation/Hospitalization:  No - Comment as needed Significant Relationships:  Adult Children Lives with:  Self Do you feel safe going back to the place where you live?  No (Patient feel she needs some rehab first before she returns back home) Need for family participation in patient care:  Yes (Comment) (Patient requests CSW to discuss with her family)  Care giving concerns:  Patient and her kids feel like she need some short term rehab before she is able to go home.   Social Worker assessment / plan: Patient is a 54 year old female who lives on her own and is alert and oriented x4.  Patient has a significant psych and medical history.  Patient expressed that last time she was at the hospital was in February and she did not have a good experience on the unit she was on.  Patient expressed some of her frustrations to CSW about the people that were working with her, but she stated she has had a much better experience this time.  Patient stated that she has spoken to her kids and would like to go to [name] SNF for short term rehab.  Patient stated that she is aware that the SNF may not have any beds available, and is  aware that she may have to go somewhere else.  The patient's family were explained that the preferred facility may not be able to take patient, and they expressed understanding that there needs to be a back up facility.  Patient and family were explained process for looking for SNF placement and they stated they did not have any other questions.  Employment status:  Disabled (Comment on whether or not currently receiving Disability) Insurance information:  Medicare PT Recommendations:  [place] / Referral to community resources:  [place]  Patient/Family's Response to care:  Patient and family are in agreement to going to SNF for short term rehab.  Patient/Family's Understanding of and Emotional Response to Diagnosis, Current Treatment, and Prognosis:  Patient and family are aware of patient's current treatment goal and prognosis.  Emotional Assessment Appearance:  Appears stated age Attitude/Demeanor/Rapport:    Affect (typically observed):  Appropriate, Blunt, Calm, Stable Orientation:  Oriented to Self, Oriented to Place, Oriented to  Time, Oriented to Situation Alcohol / Substance use:  Not Applicable Psych involvement (Current and /or in the community):  Yes (Comment) (Hospitial psychiatry has seen patient)  Discharge Needs  Concerns to be addressed:  Lack of Support, Home Safety Concerns Readmission within the last 30 days:  No Current discharge risk:  Chronically ill Barriers to Discharge:  [place] ([place]   Evette Cristal  R, LCSWA 11/19/2014, 5:05 PM

## 2014-11-19 NOTE — Care Management Important Message (Signed)
Important Message  Patient Details  Name: Shelly Le MRN: [id number] Date of Birth: 08-01-1960   Medicare Important Message Given:  Yes-second notification given    Delorse Lek 11/19/2014, 2:03 PM

## 2014-11-19 NOTE — Discharge Summary (Addendum)
[facility]  Physician Discharge Summary   Patient ID: Shelly Le MRN: [id number] DOB/AGE: 55-Oct-1962 55 y.o.  Admit date: 11/14/2014 Discharge date: 11/20/2014  PCP: Daneil Dan, MD  DISCHARGE DIAGNOSES:  Principal Problem:   Adjustment disorder with mixed emotional features Active Problems:   Addison disease   DI (diabetes insipidus)   Convulsions, epileptic   Adult hypothyroidism   Adenoma of pituitary s/p resection    Seizure   Tachycardia   Odynophagia   Dehydration   Pseudoseizures   Seizure disorder   Diabetes insipidus   Other specified hypothyroidism   Drug-seeking behavior   Chronic pain syndrome   RECOMMENDATIONS FOR OUTPATIENT FOLLOW UP: 1. Avoid narcotics as much as possible 2. Check TSH and free T4 in 3 weeks.  DISCHARGE CONDITION: fair  Diet recommendation: Soft diet with thin liquids  Filed Weights   11/15/14 0424  Weight: 68.7 kg (151 lb 7.3 oz)    INITIAL HISTORY: 54 y.o. F Hx Addison's disease; Diabetes insipidus; Pneumonia; Seizures; Brain tumor, chronic pain syndrome, and noncompliance who presented with c/o burning with swallowing and deep breaths. She was unable to take any medications or drink. Patient has history of seizure disorder and was not able to tolerate her kepra due to pain on swallowing. In the ED she then had an episode which appeared to be seizure-like and was given IV Keppra. CT of soft tissues of the neck was obtained and was unremarkable. Plain film was unremarkable.  Perreview of records in February she was admitted w/ similar symptoms of burning with swallowing and was found to have gastric ulcers. Patient [place], [name] she had a nuclear medicine gastric emptying study that showed mild gastroparesis. An [place] noted minimal sludge in the gallbladder. EGD also in February showed shallow ulcers.   During an  admission to [name] in February she was having repeated episodes which were thought to be pseudoseizures. She was evaluated by Psychiatry who recommended continuation of current medications. MRI of the brain at that time was unremarkable. EEG was wnl.   Consultants: Neurology Psychiatry  Procedure/Significant Events: 7/14 EEG - normal   HOSPITAL COURSE:   Seizure disorder versus pseudoseizures Neurology believes these are pseudoseizures. EEG normal. Per Neurology continue Keppra 1000 mg twice a day. Repeat Psychiatric consult requested. They recommended to continue Keppra. Depakote can be added if she continues to have seizure type activities.  Somnolent This was most likely multifactorial to include pseudoseizures, noncompliance with medications, medication overuse (gabapentin, Zanaflex, Xanax). Alert and talkative now.  Chronic steroid therapy reportedly not Addison's disease Patient is on hydrocortisone at home. When she was hospitalized. She was placed on intravenous hydrocortisone. This was changed over to oral. Recent notes from her visit to [name] were reviewed by my colleagues. Note from [name] (a physician at [name] endocrinology) dated 09/07/2014 suggested that the patient has actually been found to not be suffering with adrenal insufficiency. [name] states in his note that he attempted to wean the patient from steroids but that she refuses to do so.  DI (diabetes insipidus) Review of records from [facility] reports that the patient did in fact have DI immediately following her pituitary adenoma resection in 2000 but confirms that she was subsequently taken off of DDAVP and was able to maintain a normal sodium and urine specific gravity. The note goes on to explain and the patient was later placed  back on DDAVP "when necessary for urinary frequency". This notes suggests that DDAVP should be discontinued in the future at outpatient  follow-up.  Hypothyroidism TSH was noted to be very low at 0.057 suggesting overcorrection with Synthroid. Free T4 noted to be elevated as well. Dose of Synthroid was reduced. Would recommend checking TSH and free T4 in 2-3 weeks' time.   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  Tachycardia This has resolved. Troponins were negative. Echocardiogram was unremarkable. VQ scan was normal.  Dehydration Corrected  Odynophagia Etiology unclear. Patient has had a recent extensive GI evaluation at [facility] and the results of this are available on care-everywhere. Off note the admitting team at [name] felt that the patient's symptoms were possibly psychosomatic in etiology. Right was advanced slowly. She is tolerating a soft diet at this time. Her symptoms are chronic at this point.  Chronic Pain syndrome / Drug-seeking behavior There is a history of narcotic and benzodiazepine seeking behavior. 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 Will avoid narcotics. Dose of Zanaflex was decreased. Dose of gabapentin was decreased.  Overall, patient has improved and is stable. Does not require inpatient hospitalization anymore. She was seen by physical therapy and was thought to require skilled nursing facility. She is stable for discharge.    PERTINENT LABS:  The results of significant diagnostics from this hospitalization (including imaging, microbiology, ancillary and laboratory) are listed below for reference.    Microbiology: Recent Results (from the past 240 hour(s))  MRSA PCR Screening     Status: None   Collection Time: 11/15/14  4:28 AM  Result Value Ref Range Status   MRSA by PCR NEGATIVE NEGATIVE Final     Comment:        The GeneXpert MRSA Assay (FDA approved for NASAL specimens only), is one component of a comprehensive MRSA colonization surveillance program. It is not intended to diagnose MRSA infection nor to guide or monitor treatment for MRSA infections.   Urine culture     Status: None (Preliminary result)   Collection Time: 11/15/14 12:30 PM  Result Value Ref Range Status   Specimen Description URINE, RANDOM  Final   Special Requests NONE  Final   Culture CULTURE REINCUBATED FOR BETTER GROWTH  Final   Report Status PENDING  Incomplete     Labs: Basic Metabolic Panel:  Recent Labs Lab 11/14/14 2015 11/15/14 0445 11/17/14 0513  NA 139 143 137  K 4.1 3.7 4.2  CL 104 107 105  CO2 20* 25 27  GLUCOSE 122* 93 124*  BUN 12 13 17   CREATININE 1.36* 1.03* 1.00  CALCIUM 9.6 9.1 8.8*  MG  --  2.2  --   PHOS  --  3.6  --    Liver Function Tests:  Recent Labs Lab 11/15/14 0445 11/17/14 0513  AST 17 20  ALT 11* 12*  ALKPHOS 90 66  BILITOT 0.7 0.2*  PROT 7.2 5.9*  ALBUMIN 4.0 3.2*   CBC:  Recent Labs Lab 11/14/14 2015 11/15/14 0445 11/17/14 0513  WBC 11.2* 8.8 5.7  HGB 13.5 11.8* 10.8*  HCT 40.4 36.0 32.5*  MCV 79.8 80.7 80.6  PLT 361 284 235   Cardiac Enzymes:  Recent Labs Lab 11/15/14 0445 11/15/14 1427  11/15/14 2025  TROPONINI <0.03 <0.03 <0.03     IMAGING STUDIES Dg Chest 2 View  11/14/2014   CLINICAL DATA:  Chest pain and shortness of breath for 2 days  EXAM: CHEST - 2 VIEW  COMPARISON:  08/04/2014  FINDINGS: The heart size and mediastinal contours are within normal limits. Both lungs are clear. The visualized skeletal structures are unremarkable.  IMPRESSION: No active disease.   Electronically Signed   By: Inez Catalina M.D.   On: 11/14/2014 20:51   Ct Soft Tissue Neck Wo Contrast  11/14/2014   CLINICAL DATA:  54 year old female with bilateral anterior neck pain  EXAM: CT NECK WITHOUT CONTRAST  TECHNIQUE: Multidetector CT imaging of the neck  was performed following the standard protocol without intravenous contrast.  COMPARISON:  Head CT dated 08/30/2014  FINDINGS: Evaluation of this exam is limited in the absence of intravenous contrast.  The visualized upper lungs appear unremarkable. The central airways are patent. The visualized paranasal sinuses and mastoid air cells are clear. Focal right temporal craniectomy defect noted. The visualized brain appears unremarkable. There is degenerative changes of the spine most prominent at C4-C6. No acute fracture identified. The parotid, and submandibular glands appear unremarkable. The thyroid gland is small. There is no lymphadenopathy. The soft tissues are unremarkable. No drainable fluid collection identified. A left-sided aortic arch with aberrant right subclavian artery anatomy is noted.  IMPRESSION: No acute findings.   Electronically Signed   By: Anner Crete M.D.   On: 11/14/2014 23:22   Nm Pulmonary Perf And Vent  11/15/2014   CLINICAL DATA:  Chest pain and shortness of breath.  EXAM: NUCLEAR MEDICINE VENTILATION - PERFUSION LUNG SCAN  TECHNIQUE: Ventilation images were obtained in multiple projections using inhaled aerosol Tc-35m DTPA. Perfusion images were obtained in multiple projections after intravenous injection of Tc-75m MAA.  RADIOPHARMACEUTICALS:  40 mCi of Technetium-37m DTPA aerosol inhalation and 6 mCi of Technetium-58m MAA IV  COMPARISON:  Chest x-ray dated 11/14/2014  FINDINGS: Ventilation: No focal ventilation defect.  Perfusion: No wedge shaped peripheral perfusion defects to suggest acute pulmonary embolism.  IMPRESSION: Normal ventilation-perfusion lung scan. No evidence of pulmonary embolism.   Electronically Signed   By: Lorriane Shire M.D.   On: 11/15/2014 12:29    DISCHARGE EXAMINATION: Filed Vitals:   11/18/14 1327 11/18/14 2155 11/19/14 1349 11/19/14 2137  BP: 122/72 109/63 141/68 126/89  Pulse: 92 96 98 102  Temp: 98.5 F (36.9 C) 98.9 F (37.2 C) 97.9 F  (36.6 C) 98.4 F (36.9 C)  TempSrc: Oral Oral Oral Oral  Resp: 16 16 16 18   Height:      Weight:      SpO2: 100% 96% 97% 100%   General appearance: alert, cooperative, appears stated age and no distress Resp: clear to auscultation bilaterally Cardio: regular rate and rhythm, S1, S2 normal, no murmur, click, rub or gallop GI: soft, non-tender; bowel sounds normal; no masses,  no organomegaly  DISPOSITION: SNF  Discharge Instructions    Call MD for:  extreme fatigue    Complete by:  As directed      Call MD for:  persistant dizziness or light-headedness    Complete by:  As directed      Call MD for:  persistant nausea and vomiting    Complete by:  As directed      Call MD for:  severe uncontrolled pain    Complete by:  As directed      Call MD for:  temperature >100.4    Complete by:  As directed      Discharge diet:    Complete by:  As directed   Soft diet with thin liquids     Discharge instructions    Complete by:  As directed   Avoid narcotics as much as possible.  You were cared for by a hospitalist during your hospital stay. If you have any questions about your discharge medications or the care you received while you were in the hospital after you are discharged, you can call the unit and asked to speak with the hospitalist on call if the hospitalist that took care of you is not available. Once you are discharged, your primary care physician will handle any further medical issues. Please note that NO REFILLS for any discharge medications will be authorized once you are discharged, as it is imperative that you return to your primary care physician (or establish a relationship with a primary care physician if you do not have one) for your aftercare needs so that they can reassess your need for medications and monitor your lab values. If you do not have a primary care physician, you can call [phone] for a physician referral.     Increase activity slowly    Complete by:  As  directed            ALLERGIES:  Allergies  Allergen Reactions  . Contrast Media [Iodinated Diagnostic Agents] Shortness Of Breath and Rash  . Fentanyl Other (See Comments)    Witnessed grand tonic clonic seizure in ED while wearing patch (02/26/14); h/o seizure disorder (pt states she still uses patches as needed for pain)  . Ioxaglate Hives, Shortness Of Breath and Rash  . Morphine Hives and Shortness Of Breath  . Shellfish Allergy Anaphylaxis and Hives  . Penicillins Nausea And Vomiting    GI Intolerance  . Latex Rash  . Metrizamide Rash     Current Discharge Medication List    START taking these medications   Details  lactose free nutrition (BOOST PLUS) LIQD Take 237 mLs by mouth 3 (three) times daily with meals. Refills: 0    ondansetron (ZOFRAN) 4 MG tablet Give 4 mg before each meal for 3 days and then every 6 hours as needed for nausea/vomiting. Patient has chronic nausea. Qty: 20 tablet, Refills: 0      CONTINUE these medications which have CHANGED   Details  ALPRAZolam (XANAX) 1 MG tablet Take 1 tablet (1 mg total) by mouth 3 (three) times daily as needed for anxiety. Qty: 30 tablet, Refills: 0    gabapentin (NEURONTIN) 100 MG capsule Take 1 capsule (100 mg total) by mouth 3 (three) times daily.    levothyroxine (SYNTHROID, LEVOTHROID) 25 MCG tablet Take 1.5 tablets (37.5 mcg total) by mouth daily before breakfast.    tiZANidine (ZANAFLEX) 2 MG tablet Take 3 tablets (6 mg total) by mouth every 8 (eight) hours as needed for muscle spasms.      CONTINUE these medications which have NOT CHANGED   Details  calcium carbonate (OS-CAL - DOSED IN MG OF ELEMENTAL CALCIUM) 1250 (500 CA) MG tablet Take 2 tablets by mouth daily.    desmopressin (DDAVP) 0.01 % SOLN Place 1 spray into the nose 2 (two) times daily. 1 spray - 10 mcg    hydrocortisone (CORTEF) 10 MG tablet Take 10-20 mg by mouth 2 (two) times daily. Take 2 tablets, 20mg  in the morning. Take 10mg  at 3pm     levETIRAcetam (  KEPPRA) 1000 MG tablet Take 1,000 mg by mouth 2 (two) times daily.    mirtazapine (REMERON) 15 MG tablet Take 15 mg by mouth at bedtime.    olopatadine (PATANOL) 0.1 % ophthalmic solution Place 1 drop into both eyes 2 (two) times daily.    pantoprazole (PROTONIX) 40 MG tablet Take 40 mg by mouth daily.    potassium chloride (MICRO-K) 10 MEQ CR capsule Take 10 mEq by mouth 2 (two) times daily.    Vitamin D, Ergocalciferol, (DRISDOL) 50000 UNITS CAPS capsule Take 50,000 Units by mouth every 30 (thirty) days.      STOP taking these medications     fentaNYL (DURAGESIC - DOSED MCG/HR) 50 MCG/HR      HYDROcodone-acetaminophen (NORCO/VICODIN) 5-325 MG per tablet      methocarbamol (ROBAXIN) 500 MG tablet      promethazine (PHENERGAN) 25 MG tablet      sucralfate (CARAFATE) 1 G tablet        Follow-up Information    Follow up with NKEMBE, KWAMBA E, MD. Schedule an appointment as soon as possible for a visit in 2 weeks.   Why:  post hospitalization follow up   Contact information:   [place] [address] [place] [address] [phone]       TOTAL DISCHARGE TIME: 35 minutes  [facility] Pager [phone]  11/20/2014, 10:27 AM

## 2014-11-19 NOTE — Clinical Social Work Note (Signed)
CSW spoke to patient to discuss SNF placement.  Patient was explained process and given information about SNF facilities.  Patient stated she would like CSW to speak with her son and daughter to discuss SNF placement options.  Patient stated she would like to go to [facility] if possible, but is open to other facilities if they can not take her.  CSW spoke to patient's son Jose Persia at [phone] and he was given bed offers for patient.  CSW contacted [facility] to see if they can take patient and a message was left, awaiting call back.  Patient has not received a [facility] number yet, patient is a Level 2 manual screen [facility] requested additional information for patient, which was faxed, awaiting [facility] number.  CSW notified case manager and physician that patient will not be able to discharge until [facility] number has been received.  CSW to continue to follow patient throughout discharge planning.  Jones Broom. [place], MSW, [place] 11/19/2014 4:58 PM

## 2014-11-19 NOTE — Discharge Instructions (Signed)

## 2014-11-19 NOTE — Clinical Social Work Placement (Addendum)
   CLINICAL SOCIAL WORK PLACEMENT  NOTE  Date:  11/19/2014  Patient Details  Name: Shelly Le MRN: [id number] Date of Birth: Aug 19, 1960  Clinical Social Work is seeking post-discharge placement for this patient at the [place] level of care (*CSW will initial, date and re-position this form in  chart as items are completed):  Yes   Patient/family provided with [facility] Work Department's list of facilities offering this level of care within the geographic area requested by the patient (or if unable, by the patient's family).  Yes   Patient/family informed of their freedom to choose among providers that offer the needed level of care, that participate in Medicare, Medicaid or managed care program needed by the patient, have an available bed and are willing to accept the patient.  Yes   Patient/family informed of [facility]'s ownership interest in [name] and [facility], as well as of the fact that they are under no obligation to receive care at these facilities.  PASRR submitted to EDS on 11/19/14     PASRR number received on  11/20/14     Existing PASRR number confirmed on       FL2 transmitted to all facilities in geographic area requested by pt/family on  11/19/14     FL2 transmitted to all facilities within larger geographic area on       Patient informed that his/her managed care company has contracts with or will negotiate with certain facilities, including the following:   yes     Yes   Patient/family informed of bed offers received.  Patient chooses bed at  [facility]     Physician recommends and patient chooses bed at      Patient to be transferred to  [facility] on  11/20/14.  Patient to be transferred to facility by  Patient's son     Patient family notified on  11/20/14 of transfer.  Name of family member notified:   Jose Persia     PHYSICIAN       Additional Comment:     _______________________________________________ Ross Ludwig, [place] 11/19/2014, 5:19 PM

## 2014-11-20 MED ORDER — ONDANSETRON HCL 4 MG PO TABS: ORAL_TABLET | ORAL | Status: AC

## 2014-11-20 MED ORDER — ONDANSETRON HCL 4 MG PO TABS: ORAL_TABLET | ORAL | Status: DC

## 2014-11-20 NOTE — Progress Notes (Signed)
Pt discharged to SNF, [facility].  IV's dc'd.  Report called and given to [facility].  Packet given to son to give to staff at [facility].

## 2014-11-20 NOTE — Clinical Social Work Note (Addendum)
CSW received phone call from [facility] SNF confirming they can take patient today.  Discharge summary was faxed to facility to prepare for transfer of patient.  CSW spoke with patient and her son about discharge plan, and both are in agreement to going to SNF for short term rehab today, [facility] number has been received.  Notified physician that patient has a bed available at SNF and can be discharged once she is medically ready and discharge orders have been received.  CSW was informed that patient's son will be picking up and transporting patient after he fills out paperwork for SNF.  Patient to be discharged to [facility] today.  Jones Broom. [place], MSW, [place] 11/20/2014 11:06 AM

## 2014-11-20 NOTE — Progress Notes (Signed)
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 Patient also reports burning sensation with swallowing.    PT Comments    Pt able to increase gait distance today and overall steadier, but continues to have narrow BOS with gait with heels almost hitting each other.  Pt to SNF today per notes.  Follow Up Recommendations  SNF     Equipment Recommendations  None recommended by PT    Recommendations for Other Services       Precautions / Restrictions Precautions Precautions: Fall    Mobility  Bed Mobility               General bed mobility comments: up in bathroom upon arrival.  Transfers       Sit to Stand: Min assist         General transfer comment: A to power up from recliner  Ambulation/Gait Ambulation/Gait assistance: Min guard Ambulation Distance (Feet): 120 Feet Assistive device: Rolling walker (2 wheeled) Gait Pattern/deviations: Narrow base of support Gait velocity: decreased   General Gait Details: Pt ambulating with narrow BOS with ER hips with heels almost hitting.   Stairs            Wheelchair Mobility    Modified Rankin (Stroke Patients Only)       Balance Overall balance assessment: Needs assistance         Standing balance support: During functional activity Standing balance-Leahy Scale: Fair Standing balance comment: Pt stood to do hair with min/guard for balance.  Cues to keep RW with her.                    Cognition Arousal/Alertness: Awake/alert Behavior During Therapy: Impulsive Overall Cognitive Status: Difficult to assess                      Exercises      General Comments        Pertinent Vitals/Pain Pain Assessment: 0-10 Pain Score: 3  Pain Location:  unable to pinpoint    Home Living                      Prior Function            PT Goals (current goals can now be found in the care plan section) Acute Rehab PT Goals Patient Stated Goal: to get stronger PT Goal Formulation: With patient Time For Goal Achievement: 12/01/14 Potential to Achieve Goals: Good Progress towards PT goals: Progressing toward goals    Frequency  Min 3X/week    PT Plan Current plan remains appropriate    Co-evaluation             End of Session   Activity Tolerance: Patient tolerated treatment well Patient left: in chair;with call bell/phone within reach     Time: 1127-1151 PT Time Calculation (min) (ACUTE ONLY): 24 min  Charges:  $Gait Training: 23-37 mins                    G Codes:      Monnie Gudgel LUBECK 11/20/2014, 1:07 PM

## 2014-11-20 NOTE — Progress Notes (Signed)
Patient seen and examined this morning. She could not go to skilled nursing facility yesterday due to administrative issues.  Patient had some episode of nausea last night with her supper. And had some nausea this morning, although felt better. She had to be given Zofran.  Vital signs are all stable. Examination is unchanged with benign Abdomen. Lungs are clear to auscultation. Cardiac examination is also benign.  I explained to the patient's that the nausea for her, unfortunately, is chronic. She will have times when she is nauseated. Unfortunately, she has not tolerated Reglan in the past for her gastroparesis. At this time, we would recommend that she be given Zofran before every meal for the next 3 days and then as needed. As she becomes more more active and mobile her symptoms should improve but once again I have explained to the patient that this is not something that will completely resolve. In her case. She has had extensive GI workup both here as well as at [facility].  I still believe that she can be discharged to skilled nursing facility today. Discharge summary has been amended accordingly.  Taria Castrillo 10:37 AM 11/20/2014

## 2014-11-20 NOTE — Progress Notes (Signed)
Patient discharged to [facility] via wheelchair with CNA and son.  Reminded son of discharge packet to be given to [facility] staff upon arrival.

## 2014-11-21 NOTE — Clinical Social Work Note (Signed)
CSW received phone call from patient stating she went to [facility] last night, and she did not like the facility.  Patient stated she was mad at her son Jacqulynn Cadet because he did not look at the rooms after he filled out paperwork and picked her up so late around 8:00pm.  Patient stated that she checked herself out last night from SNF and her daughter Ronny Bacon picked her up.  Patient expressed frustration with the facility and was mad at her son for choosing SNF for her even though she requested CSW to speak with her son about choosing facilities and other bed offers were given to patient and son.  Patient asked how she can file a complaint about the SNF, CSW gave her the phone number for [place] [facility] complaint hotline.  Patient asked how she can go to a different facility, CSW informed her that once she discharges from the hospital she would have to contact her primary care physician to arrange either home health or SNF placement and would have to contact other facilities herself.  Patient expressed frustration that she does not understand why the physician that saw her in the hospital can not refer her to a different facility.  CSW explained to her that once she discharges from the hospital, the [place] and physician can not help with placements or referrals.  Patient stated she will contact her PCP and also contact [place] to file complaint about facility.  Jones Broom. [place], MSW, [place] 11/21/2014 4:48 PM

## 2014-12-12 ENCOUNTER — Other Ambulatory Visit (HOSPITAL_COMMUNITY): Payer: Self-pay | Admitting: Family Medicine

## 2014-12-12 DIAGNOSIS — R609 Edema, unspecified: Secondary | ICD-10-CM

## 2014-12-13 ENCOUNTER — Ambulatory Visit (HOSPITAL_COMMUNITY)
Admission: RE | Admit: 2014-12-13 | Discharge: 2014-12-13 | Disposition: A | Discharge status: Home / Self Care | Payer: Medicare Other | Source: Ambulatory Visit | Attending: Family Medicine | Admitting: Family Medicine

## 2014-12-13 DIAGNOSIS — R600 Localized edema: Secondary | ICD-10-CM | POA: Diagnosis not present

## 2014-12-13 DIAGNOSIS — R609 Edema, unspecified: Secondary | ICD-10-CM | POA: Diagnosis not present

## 2014-12-13 DIAGNOSIS — M79605 Pain in left leg: Secondary | ICD-10-CM | POA: Insufficient documentation

## 2014-12-13 NOTE — Progress Notes (Signed)
VASCULAR LAB PRELIMINARY  PRELIMINARY  PRELIMINARY  PRELIMINARY  Left lower extremity venous duplex completed.    Preliminary report:  Left:  No evidence of DVT, superficial thrombosis, or Baker's cyst. Interstitial fluid at the site of the swelling of the anterior ankle etiology unknown  Isabel Ardila, RVS 12/13/2014, 11:30 AM

## 2014-12-17 ENCOUNTER — Emergency Department (HOSPITAL_COMMUNITY)
Admission: EM | Admit: 2014-12-17 | Discharge: 2014-12-18 | Disposition: A | Discharge status: Home / Self Care | Payer: Medicare Other | Attending: Emergency Medicine | Admitting: Emergency Medicine

## 2014-12-17 ENCOUNTER — Encounter (HOSPITAL_COMMUNITY): Payer: Self-pay | Admitting: *Deleted

## 2014-12-17 DIAGNOSIS — Z8639 Personal history of other endocrine, nutritional and metabolic disease: Secondary | ICD-10-CM | POA: Insufficient documentation

## 2014-12-17 DIAGNOSIS — M79605 Pain in left leg: Secondary | ICD-10-CM | POA: Diagnosis present

## 2014-12-17 DIAGNOSIS — Z8701 Personal history of pneumonia (recurrent): Secondary | ICD-10-CM | POA: Diagnosis not present

## 2014-12-17 DIAGNOSIS — Z91040 Latex allergy status: Secondary | ICD-10-CM | POA: Insufficient documentation

## 2014-12-17 DIAGNOSIS — Z880 Allergy status to penicillin: Secondary | ICD-10-CM | POA: Insufficient documentation

## 2014-12-17 DIAGNOSIS — Z86011 Personal history of benign neoplasm of the brain: Secondary | ICD-10-CM | POA: Diagnosis not present

## 2014-12-17 DIAGNOSIS — Z79899 Other long term (current) drug therapy: Secondary | ICD-10-CM | POA: Insufficient documentation

## 2014-12-17 DIAGNOSIS — G40909 Epilepsy, unspecified, not intractable, without status epilepticus: Secondary | ICD-10-CM | POA: Diagnosis not present

## 2014-12-17 LAB — CBC WITH DIFFERENTIAL/PLATELET
BASOS ABS: 0 10*3/uL (ref 0.0–0.1)
BASOS PCT: 0 % (ref 0–1)
EOS PCT: 2 % (ref 0–5)
Eosinophils Absolute: 0.1 10*3/uL (ref 0.0–0.7)
HCT: 36.8 % (ref 36.0–46.0)
Hemoglobin: 12.2 g/dL (ref 12.0–15.0)
LYMPHS PCT: 39 % (ref 12–46)
Lymphs Abs: 2.8 10*3/uL (ref 0.7–4.0)
MCH: 26.5 pg (ref 26.0–34.0)
MCHC: 33.2 g/dL (ref 30.0–36.0)
MCV: 80 fL (ref 78.0–100.0)
MONO ABS: 0.3 10*3/uL (ref 0.1–1.0)
Monocytes Relative: 4 % (ref 3–12)
Neutro Abs: 4 10*3/uL (ref 1.7–7.7)
Neutrophils Relative %: 55 % (ref 43–77)
PLATELETS: 369 10*3/uL (ref 150–400)
RBC: 4.6 MIL/uL (ref 3.87–5.11)
RDW: 13.9 % (ref 11.5–15.5)
WBC: 7.2 10*3/uL (ref 4.0–10.5)

## 2014-12-17 LAB — COMPREHENSIVE METABOLIC PANEL
ALBUMIN: 4.5 g/dL (ref 3.5–5.0)
ALT: 20 U/L (ref 14–54)
AST: 28 U/L (ref 15–41)
Alkaline Phosphatase: 120 U/L (ref 38–126)
Anion gap: 13 (ref 5–15)
BUN: 5 mg/dL — AB (ref 6–20)
CHLORIDE: 104 mmol/L (ref 101–111)
CO2: 23 mmol/L (ref 22–32)
CREATININE: 1.26 mg/dL — AB (ref 0.44–1.00)
Calcium: 9.7 mg/dL (ref 8.9–10.3)
GFR calc Af Amer: 55 mL/min — ABNORMAL LOW (ref >60)
GFR, EST NON AFRICAN AMERICAN: 47 mL/min — AB (ref >60)
GLUCOSE: 97 mg/dL (ref 65–99)
POTASSIUM: 3.5 mmol/L (ref 3.5–5.1)
SODIUM: 140 mmol/L (ref 135–145)
Total Bilirubin: 0.4 mg/dL (ref 0.3–1.2)
Total Protein: 8.3 g/dL — ABNORMAL HIGH (ref 6.5–8.1)

## 2014-12-17 MED ORDER — TETRACAINE HCL 0.5 % OP SOLN: 2.0000 [drp] | Freq: Once | OPHTHALMIC | Status: DC

## 2014-12-17 NOTE — ED Notes (Signed)
Pt c/o L lower leg onset 2 weeks ago. Reports intermittent edema and worsening pain to L lower leg and right arm. Pt presents with pictures on cellphone of swelling to R arm and L leg. At present, minimal swelling to L leg at this time. Pt reports home health nurse sent pt for an [place] on 8/11 to L leg; home health nurse referred pt to ED for possible MRI to leg.

## 2014-12-17 NOTE — ED Notes (Signed)
Ptaient presents with c/o left lower leg pain that comes and goes and swelling happens.  Has been seen for the same and her arm begins to swell at times  Patient has pictures of her leg

## 2014-12-18 MED ORDER — HYDROCODONE-ACETAMINOPHEN 5-325 MG PO TABS: 1.0000 | ORAL_TABLET | ORAL | Status: DC | PRN

## 2014-12-18 NOTE — ED Provider Notes (Signed)
CSN: [id number]     Arrival date & time 12/17/14  1940 History   First MD Initiated Contact with Patient 12/17/14 2154     Chief Complaint  Patient presents with  . Painful leg      (Consider location/radiation/quality/duration/timing/severity/associated sxs/prior Treatment) HPI Comments: Patient with a history of Addison's Disease, Diabetes insipidus, Seizures presents with intermittent pain and swelling of the left lower extremity. Symptoms have been present for the past 3 weeks, and started approximately one week after discharge from her most recent admission. She reports she is receiving in-home nursing care and was sent 2 days ago for doppler study of the leg which was negative for clots. The patient states she returns tonight as directed by her home nurse for MRI of the leg for definitive diagnosis. She is currently asymptomatic, but states her symptoms recur when she is ambulatory for any duration of time. She describes generalized swelling of the distal leg with redness, and pain that extends to the buttock. No fever, numbness. No history of clots per patient.  The history is provided by the patient. No language interpreter was used.    Past Medical History  Diagnosis Date  . Addison's disease   . Diabetes insipidus   . Pneumonia   . Seizures   . Brain tumor    Past Surgical History  Procedure Laterality Date  . Tonsillectomy    . Abdominal hysterectomy    . Brain surgery      crainotomy  . Peg tube placement  2012    had it removed 2012  . Esophagogastroduodenoscopy N/A 06/21/2014    Procedure: ESOPHAGOGASTRODUODENOSCOPY (EGD);  Surgeon: Winfield Cunas., MD;  Location: [facility] ENDOSCOPY;  Service: Endoscopy;  Laterality: N/A;   No family history on file. Social History  Substance Use Topics  . Smoking status: Never Smoker   . Smokeless tobacco: Never Used  . Alcohol Use: No   OB History    No data available     Review of Systems  Constitutional: Negative for fever  and chills.  HENT: Negative.   Respiratory: Negative.   Cardiovascular: Negative.   Gastrointestinal: Negative.   Musculoskeletal:       See hPI.  Skin: Positive for color change. Negative for wound.  Neurological: Negative.       Allergies  Contrast media; Fentanyl; Ioxaglate; Morphine; Shellfish allergy; Penicillins; Latex; and Metrizamide  Home Medications   Prior to Admission medications   Medication Sig Start Date End Date Taking? Authorizing Provider  ALPRAZolam Duanne Moron) 1 MG tablet Take 1 tablet (1 mg total) by mouth 3 (three) times daily as needed for anxiety. 11/19/14  Yes Bonnielee Haff, MD  calcium carbonate (OS-CAL - DOSED IN MG OF ELEMENTAL CALCIUM) 1250 (500 CA) MG tablet Take 2 tablets by mouth daily.   Yes Historical Provider, MD  desmopressin (DDAVP) 0.01 % SOLN Place 1 spray into the nose 2 (two) times daily. 1 spray - 10 mcg 05/09/14  Yes Historical Provider, MD  gabapentin (NEURONTIN) 100 MG capsule Take 1 capsule (100 mg total) by mouth 3 (three) times daily. 11/18/14  Yes Bonnielee Haff, MD  hydrocortisone (CORTEF) 10 MG tablet Take 10-20 mg by mouth 2 (two) times daily. Take 2 tablets, 20mg  in the morning. Take 10mg  at 3pm   Yes Historical Provider, MD  lactose free nutrition (BOOST PLUS) LIQD Take 237 mLs by mouth 3 (three) times daily with meals. 11/18/14  Yes Bonnielee Haff, MD  levETIRAcetam (KEPPRA) 1000 MG tablet Take  1,000 mg by mouth 2 (two) times daily.   Yes Historical Provider, MD  levothyroxine (SYNTHROID, LEVOTHROID) 112 MCG tablet Take 112 mcg by mouth daily before breakfast.   Yes Historical Provider, MD  mirtazapine (REMERON) 15 MG tablet Take 15 mg by mouth at bedtime.   Yes Historical Provider, MD  olopatadine (PATANOL) 0.1 % ophthalmic solution Place 1 drop into both eyes 2 (two) times daily.   Yes Historical Provider, MD  ondansetron (ZOFRAN) 4 MG tablet Give 4 mg before each meal for 3 days and then every 6 hours as needed for nausea/vomiting.  Patient has chronic nausea. 11/20/14  Yes Bonnielee Haff, MD  pantoprazole (PROTONIX) 40 MG tablet Take 40 mg by mouth daily.   Yes Historical Provider, MD  potassium chloride (MICRO-K) 10 MEQ CR capsule Take 10 mEq by mouth 2 (two) times daily.   Yes Historical Provider, MD  tiZANidine (ZANAFLEX) 4 MG tablet Take 8 mg by mouth every 8 (eight) hours as needed for muscle spasms.   Yes Historical Provider, MD  Vitamin D, Ergocalciferol, (DRISDOL) 50000 UNITS CAPS capsule Take 50,000 Units by mouth every 7 (seven) days. Monday   Yes Historical Provider, MD  HYDROcodone-acetaminophen (NORCO/VICODIN) 5-325 MG per tablet Take 1-2 tablets by mouth every 4 (four) hours as needed. 12/18/14   Charlann Lange, PA-C  levothyroxine (SYNTHROID, LEVOTHROID) 25 MCG tablet Take 1.5 tablets (37.5 mcg total) by mouth daily before breakfast. Patient not taking: Reported on 12/17/2014 11/18/14   Bonnielee Haff, MD   BP 145/90 mmHg  Pulse 101  Temp(Src) 98.5 F (36.9 C) (Oral)  Resp 14  Ht 5\' 5"  (1.651 m)  Wt 166 lb 1 oz (75.325 kg)  BMI 27.63 kg/m2  SpO2 96% Physical Exam  Constitutional: She is oriented to person, place, and time. She appears well-developed and well-nourished.  Neck: Normal range of motion.  Cardiovascular: Intact distal pulses.   Pulmonary/Chest: Effort normal.  Abdominal: There is no tenderness.  Musculoskeletal: Normal range of motion. She exhibits no edema.  Left lower extremity normal in appearance without swelling, redness or discoloration. FROM. Equal temperature with right LE.  Neurological: She is alert and oriented to person, place, and time.  Skin: Skin is warm and dry.    ED Course  Procedures (including critical care time) Labs Review Labs Reviewed  COMPREHENSIVE METABOLIC PANEL - Abnormal; Notable for the following:    BUN 5 (*)    Creatinine, Ser 1.26 (*)    Total Protein 8.3 (*)    GFR calc non Af Amer 47 (*)    GFR calc Af Amer 55 (*)    All other components within  normal limits  CBC WITH DIFFERENTIAL/PLATELET    Imaging Review No results found. I, Cynethia Schindler A, personally reviewed and evaluated these images and lab results as part of my medical decision-making.   EKG Interpretation None      MDM   Final diagnoses:  Pain of left lower extremity    Dr. Johnney Killian involved in patient assessment and evaluation. The patient remains asymptomatic in the emergency department, including after letting her legs hang dependently for a period of time. No swelling, or redness. Discussed the possible causes of symptoms - vascular vs neurologic. At the current time, MRI is not felt necessary. Will repeat the doppler study in 3 days to insure there is not a missed clot too early to be detected on first study. Patient agreeable to delayed study.     Charlann Lange, PA-C 12/18/14  [id number]  Charlesetta Shanks, MD 12/19/14 [phone]

## 2014-12-18 NOTE — Discharge Instructions (Signed)
CONTINUE TO KEEP THE LEG ELEVATED. FOLLOW UP WITH YOUR DOCTOR AS NEEDED/SCHEDULED, AND CONTINUE THERAPIST DIRECTED PT AND OT AT HOME AS BEFORE. RETURN ON 12/21/14 FOR REPEAT ULTRASOUND OF THE LEFT LEG.

## 2014-12-20 ENCOUNTER — Encounter (HOSPITAL_COMMUNITY): Payer: Self-pay | Admitting: *Deleted

## 2014-12-20 ENCOUNTER — Ambulatory Visit (HOSPITAL_COMMUNITY)
Admission: RE | Admit: 2014-12-20 | Discharge: 2014-12-20 | Disposition: A | Discharge status: Home / Self Care | Payer: Medicare Other | Source: Ambulatory Visit | Attending: Emergency Medicine | Admitting: Emergency Medicine

## 2014-12-20 DIAGNOSIS — M7989 Other specified soft tissue disorders: Secondary | ICD-10-CM | POA: Insufficient documentation

## 2014-12-20 DIAGNOSIS — M79609 Pain in unspecified limb: Secondary | ICD-10-CM

## 2014-12-20 DIAGNOSIS — M79605 Pain in left leg: Secondary | ICD-10-CM | POA: Insufficient documentation

## 2014-12-20 NOTE — Progress Notes (Signed)
VASCULAR LAB PRELIMINARY  PRELIMINARY  PRELIMINARY  PRELIMINARY  Left lower extremity venous duplex completed.    Preliminary report:  No evidence of DVT, superficial thrombosis, or Baker's cyst of the left lower extremity. There has been no obvious change since study of 12/13/2014  Renny Remer, [place], RVS 12/20/2014, 5:55 PM

## 2015-03-02 ENCOUNTER — Encounter (HOSPITAL_COMMUNITY): Payer: Self-pay | Admitting: Family Medicine

## 2015-03-02 ENCOUNTER — Emergency Department (HOSPITAL_COMMUNITY)
Admission: EM | Admit: 2015-03-02 | Discharge: 2015-03-02 | Discharge status: Against Medical Advice | Payer: Medicare Other | Attending: Emergency Medicine | Admitting: Emergency Medicine

## 2015-03-02 DIAGNOSIS — Y9289 Other specified places as the place of occurrence of the external cause: Secondary | ICD-10-CM | POA: Diagnosis not present

## 2015-03-02 DIAGNOSIS — S8992XA Unspecified injury of left lower leg, initial encounter: Secondary | ICD-10-CM | POA: Insufficient documentation

## 2015-03-02 DIAGNOSIS — E232 Diabetes insipidus: Secondary | ICD-10-CM | POA: Diagnosis not present

## 2015-03-02 DIAGNOSIS — Y9389 Activity, other specified: Secondary | ICD-10-CM | POA: Insufficient documentation

## 2015-03-02 DIAGNOSIS — Y998 Other external cause status: Secondary | ICD-10-CM | POA: Diagnosis not present

## 2015-03-02 DIAGNOSIS — W010XXA Fall on same level from slipping, tripping and stumbling without subsequent striking against object, initial encounter: Secondary | ICD-10-CM | POA: Diagnosis not present

## 2015-03-02 DIAGNOSIS — S99922A Unspecified injury of left foot, initial encounter: Secondary | ICD-10-CM | POA: Diagnosis present

## 2015-03-02 NOTE — ED Notes (Signed)
Pt sts she stepped off a step and fell. sts injured left foot and leg. sts severe pain.

## 2015-03-02 NOTE — ED Notes (Signed)
Pt turned in pt labels and arm band states she is leaving to long of a wait.

## 2015-07-16 IMAGING — NM NM PULMONARY VENT & PERF
16 series · 16 of 16 positions shown · non-contrast
Comparison: Chest x-ray dated 11/14/2014

CLINICAL DATA: Chest pain and shortness of breath.

EXAM:
NUCLEAR MEDICINE VENTILATION - PERFUSION LUNG SCAN
TECHNIQUE: Ventilation images were obtained in multiple projections using
inhaled aerosol 7c-11m DTPA. Perfusion images were obtained in
multiple projections after intravenous injection of 7c-11m MAA.
RADIOPHARMACEUTICALS:  40 mCi of 6echnetium-44m DTPA aerosol
inhalation and 6 mCi of 6echnetium-44m MAA IV

[Series 1: ant/post vent · 4.14mm/px · 1 of 1 slices shown (1 of 2)]
[im 1/1]
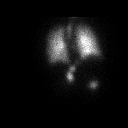

[Series 1: ant/post vent · 4.14mm/px · 1 of 1 slices shown (2 of 2)]
[im 1/1]
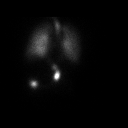

[Series 2: lao/rpo vent · 4.14mm/px · 1 of 1 slices shown (1 of 2)]
[im 1/1]
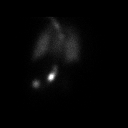

[Series 2: lao/rpo vent · 4.14mm/px · 1 of 1 slices shown (2 of 2)]
[im 1/1]
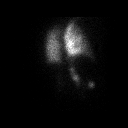

[Series 3: lpo/rao vent · 4.14mm/px · 1 of 1 slices shown (1 of 2)]
[im 1/1]
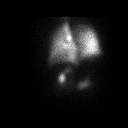

[Series 3: lpo/rao vent · 4.14mm/px · 1 of 1 slices shown (2 of 2)]
[im 1/1]
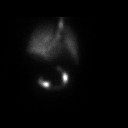

[Series 4: lt lat/rt lat vent · 4.14mm/px · 1 of 1 slices shown (1 of 2)]
[im 1/1]
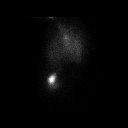

[Series 4: lt lat/rt lat vent · 4.14mm/px · 1 of 1 slices shown (2 of 2)]
[im 1/1]
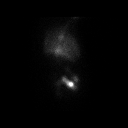

[Series 5: lt lat/rt lat perf · 4.14mm/px · 1 of 1 slices shown (1 of 2)]
[im 1/1]
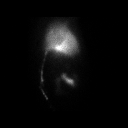

[Series 5: lt lat/rt lat perf · 4.14mm/px · 1 of 1 slices shown (2 of 2)]
[im 1/1]
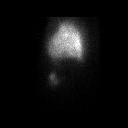

[Series 6: lpo/rao perf · 4.14mm/px · 1 of 1 slices shown (1 of 2)]
[im 1/1]
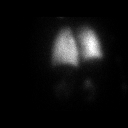

[Series 6: lpo/rao perf · 4.14mm/px · 1 of 1 slices shown (2 of 2)]
[im 1/1]
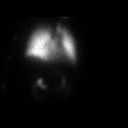

[Series 7: ant/post perf · 4.14mm/px · 1 of 1 slices shown (1 of 2)]
[im 1/1]
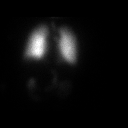

[Series 7: ant/post perf · 4.14mm/px · 1 of 1 slices shown (2 of 2)]
[im 1/1]
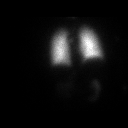

[Series 8: lao/rpo perf · 4.14mm/px · 1 of 1 slices shown (1 of 2)]
[im 1/1]
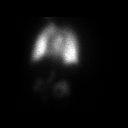

[Series 8: lao/rpo perf · 4.14mm/px · 1 of 1 slices shown (2 of 2)]
[im 1/1]
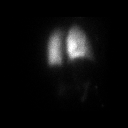

[16 of 16 positions shown; findings below may reference images not displayed]

FINDINGS: Ventilation: No focal ventilation defect.

Perfusion: No wedge shaped peripheral perfusion defects to suggest
acute pulmonary embolism.
IMPRESSION: Normal ventilation-perfusion lung scan. No evidence of pulmonary
embolism.

## 2022-12-12 ENCOUNTER — Emergency Department (HOSPITAL_BASED_OUTPATIENT_CLINIC_OR_DEPARTMENT_OTHER): Payer: Medicare Other

## 2022-12-12 ENCOUNTER — Encounter (HOSPITAL_BASED_OUTPATIENT_CLINIC_OR_DEPARTMENT_OTHER): Payer: Self-pay | Admitting: Emergency Medicine

## 2022-12-12 ENCOUNTER — Emergency Department (HOSPITAL_BASED_OUTPATIENT_CLINIC_OR_DEPARTMENT_OTHER)
Admission: EM | Admit: 2022-12-12 | Discharge: 2022-12-13 | Disposition: A | Discharge status: Home / Self Care | Payer: Medicare Other | Attending: Emergency Medicine | Admitting: Emergency Medicine

## 2022-12-12 ENCOUNTER — Other Ambulatory Visit: Payer: Self-pay

## 2022-12-12 DIAGNOSIS — S9032XA Contusion of left foot, initial encounter: Secondary | ICD-10-CM

## 2022-12-12 DIAGNOSIS — W19XXXA Unspecified fall, initial encounter: Secondary | ICD-10-CM | POA: Diagnosis not present

## 2022-12-12 DIAGNOSIS — S8001XA Contusion of right knee, initial encounter: Secondary | ICD-10-CM | POA: Diagnosis not present

## 2022-12-12 DIAGNOSIS — R519 Headache, unspecified: Secondary | ICD-10-CM | POA: Diagnosis not present

## 2022-12-12 DIAGNOSIS — S99922A Unspecified injury of left foot, initial encounter: Secondary | ICD-10-CM | POA: Diagnosis present

## 2022-12-12 DIAGNOSIS — Z91040 Latex allergy status: Secondary | ICD-10-CM | POA: Diagnosis not present

## 2022-12-12 DIAGNOSIS — Y92002 Bathroom of unspecified non-institutional (private) residence single-family (private) house as the place of occurrence of the external cause: Secondary | ICD-10-CM | POA: Diagnosis not present

## 2022-12-12 DIAGNOSIS — M542 Cervicalgia: Secondary | ICD-10-CM | POA: Insufficient documentation

## 2022-12-12 LAB — CBC WITH DIFFERENTIAL/PLATELET
Abs Immature Granulocytes: 0.01 10*3/uL (ref 0.00–0.07)
Basophils Absolute: 0 10*3/uL (ref 0.0–0.1)
Basophils Relative: 1 %
Eosinophils Absolute: 0.1 10*3/uL (ref 0.0–0.5)
Eosinophils Relative: 1 %
HCT: 41.2 % (ref 36.0–46.0)
Hemoglobin: 13.7 g/dL (ref 12.0–15.0)
Immature Granulocytes: 0 %
Lymphocytes Relative: 32 %
Lymphs Abs: 2.3 10*3/uL (ref 0.7–4.0)
MCH: 28 pg (ref 26.0–34.0)
MCHC: 33.3 g/dL (ref 30.0–36.0)
MCV: 84.3 fL (ref 80.0–100.0)
Monocytes Absolute: 0.5 10*3/uL (ref 0.1–1.0)
Monocytes Relative: 7 %
Neutro Abs: 4.2 10*3/uL (ref 1.7–7.7)
Neutrophils Relative %: 59 %
Platelets: 271 10*3/uL (ref 150–400)
RBC: 4.89 MIL/uL (ref 3.87–5.11)
RDW: 12.5 % (ref 11.5–15.5)
WBC: 7.1 10*3/uL (ref 4.0–10.5)
nRBC: 0 % (ref 0.0–0.2)

## 2022-12-12 LAB — COMPREHENSIVE METABOLIC PANEL
ALT: 100 U/L — ABNORMAL HIGH (ref 0–44)
AST: 139 U/L — ABNORMAL HIGH (ref 15–41)
Albumin: 4 g/dL (ref 3.5–5.0)
Alkaline Phosphatase: 113 U/L (ref 38–126)
Anion gap: 9 (ref 5–15)
BUN: 9 mg/dL (ref 8–23)
CO2: 23 mmol/L (ref 22–32)
Calcium: 8.6 mg/dL — ABNORMAL LOW (ref 8.9–10.3)
Chloride: 109 mmol/L (ref 98–111)
Creatinine, Ser: 1.03 mg/dL — ABNORMAL HIGH (ref 0.44–1.00)
GFR, Estimated: >60 mL/min (ref >60)
Glucose, Bld: 95 mg/dL (ref 70–99)
Potassium: 3.6 mmol/L (ref 3.5–5.1)
Sodium: 141 mmol/L (ref 135–145)
Total Bilirubin: 0.3 mg/dL (ref 0.3–1.2)
Total Protein: 7.2 g/dL (ref 6.5–8.1)

## 2022-12-12 LAB — CK: Total CK: 62 U/L (ref 38–234)

## 2022-12-12 LAB — TROPONIN I (HIGH SENSITIVITY): Troponin I (High Sensitivity): 2 ng/L (ref <18)

## 2022-12-12 MED ORDER — BACITRACIN ZINC 500 UNIT/GM EX OINT
TOPICAL_OINTMENT | Freq: Two times a day (BID) | CUTANEOUS | Status: DC
  Administered 2022-12-13: 31.5 via TOPICAL
  Filled 2022-12-12: qty 28.35

## 2022-12-12 MED ORDER — HYDROMORPHONE HCL 1 MG/ML IJ SOLN: 1.0000 mg | Freq: Once | INTRAMUSCULAR | Status: DC

## 2022-12-12 MED ORDER — HYDROMORPHONE HCL 1 MG/ML IJ SOLN
1.0000 mg | Freq: Once | INTRAMUSCULAR | Status: AC
  Administered 2022-12-12: 1 mg via INTRAVENOUS
  Filled 2022-12-12: qty 1

## 2022-12-12 MED ORDER — SODIUM CHLORIDE 0.9 % IV BOLUS
1000.0000 mL | Freq: Once | INTRAVENOUS | Status: AC
  Administered 2022-12-12: 1000 mL via INTRAVENOUS

## 2022-12-12 MED ORDER — OXYCODONE-ACETAMINOPHEN 5-325 MG PO TABS
1.0000 | ORAL_TABLET | Freq: Four times a day (QID) | ORAL | 0 refills | Status: DC | PRN
Start: 2022-12-12 — End: 2022-12-14

## 2022-12-12 MED ORDER — OXYCODONE-ACETAMINOPHEN 5-325 MG PO TABS
2.0000 | ORAL_TABLET | Freq: Once | ORAL | Status: AC
  Administered 2022-12-13: 2 via ORAL
  Filled 2022-12-12: qty 2
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
## 2022-12-12 NOTE — ED Notes (Signed)
Patient transported to CT 

## 2022-12-12 NOTE — Discharge Instructions (Addendum)
As we discussed, you did not have any fractures today.  Your lab work were unremarkable.  You can use the cam walker to help you walk  Please use your wheelchair and walker at home.  I have prescribed Percocet for pain  Please call Dr. Laverta Baltimore office for follow-up or you can follow-up with your orthopedic doctor  Return to ER if you have another fall, severe pain, passing out

## 2022-12-12 NOTE — ED Triage Notes (Signed)
Patient here after a fall, states that her hurt her left knee down to her left foot, and right knee and right great toe.  She is having trouble standing.  She denies hitting her head.

## 2022-12-12 NOTE — ED Notes (Signed)
Patient transported to X-ray 

## 2022-12-14 ENCOUNTER — Telehealth (HOSPITAL_BASED_OUTPATIENT_CLINIC_OR_DEPARTMENT_OTHER): Payer: Self-pay | Admitting: Emergency Medicine

## 2022-12-14 MED ORDER — OXYCODONE-ACETAMINOPHEN 5-325 MG PO TABS: 1.0000 | ORAL_TABLET | Freq: Four times a day (QID) | ORAL | 0 refills | Status: AC | PRN

## 2022-12-14 NOTE — Telephone Encounter (Cosign Needed)
Called by pharmacy in regards to prescription for this patient.  Reviewed ED note from 12/12/2022.  Patient was prescribed pain medication at that visit, oxycodone/acetaminophen 5-325 mg # 15 tablets, by Dr. Silverio Lay.  I spoke directly with pharmacist at that pharmacy and they confirm that they did not have a prescription for this.  I have electronically prescribed the same prescription that was documented at previous visit.
